# Patient Record
Sex: Female | Born: 1935 | Race: White | Hispanic: No | Smoking: Never smoker
Health system: Southern US, Community
[De-identification: ages and names within clinical notes are randomized; demographics above are authoritative.]

## PROBLEM LIST (undated history)

## (undated) DIAGNOSIS — E785 Hyperlipidemia, unspecified: Secondary | ICD-10-CM

## (undated) DIAGNOSIS — E559 Vitamin D deficiency, unspecified: Secondary | ICD-10-CM

## (undated) DIAGNOSIS — M810 Age-related osteoporosis without current pathological fracture: Secondary | ICD-10-CM

## (undated) DIAGNOSIS — I10 Essential (primary) hypertension: Secondary | ICD-10-CM

## (undated) DIAGNOSIS — M1990 Unspecified osteoarthritis, unspecified site: Secondary | ICD-10-CM

## (undated) HISTORY — DX: Vitamin D deficiency, unspecified: E55.9

## (undated) HISTORY — DX: Essential (primary) hypertension: I10

## (undated) HISTORY — DX: Hyperlipidemia, unspecified: E78.5

## (undated) HISTORY — DX: Unspecified osteoarthritis, unspecified site: M19.90

## (undated) HISTORY — DX: Age-related osteoporosis without current pathological fracture: M81.0

## (undated) HISTORY — PX: CHOLECYSTECTOMY: SHX55

## (undated) HISTORY — PX: EYE SURGERY: SHX253

---

## 2004-12-01 ENCOUNTER — Ambulatory Visit: Payer: Self-pay

## 2005-01-11 ENCOUNTER — Ambulatory Visit: Payer: Self-pay

## 2007-08-28 ENCOUNTER — Ambulatory Visit: Payer: Self-pay | Admitting: Family Medicine

## 2011-12-03 ENCOUNTER — Ambulatory Visit: Payer: Self-pay | Admitting: Family Medicine

## 2012-05-29 ENCOUNTER — Ambulatory Visit: Payer: Self-pay | Admitting: Family Medicine

## 2012-06-13 ENCOUNTER — Ambulatory Visit: Payer: Self-pay | Admitting: Family Medicine

## 2012-07-05 ENCOUNTER — Inpatient Hospital Stay: Payer: Self-pay | Admitting: Internal Medicine

## 2012-07-05 LAB — CBC
HCT: 30.4 % — ABNORMAL LOW (ref 35.0–47.0)
HGB: 10.2 g/dL — ABNORMAL LOW (ref 12.0–16.0)
MCH: 28.5 pg (ref 26.0–34.0)
MCHC: 33.5 g/dL (ref 32.0–36.0)
MCV: 85 fL (ref 80–100)
Platelet: 203 10*3/uL (ref 150–440)
RBC: 3.58 10*6/uL — ABNORMAL LOW (ref 3.80–5.20)
RDW: 15.7 % — ABNORMAL HIGH (ref 11.5–14.5)
WBC: 7.7 10*3/uL (ref 3.6–11.0)

## 2012-07-05 LAB — COMPREHENSIVE METABOLIC PANEL
Albumin: 2.9 g/dL — ABNORMAL LOW (ref 3.4–5.0)
Alkaline Phosphatase: 45 U/L — ABNORMAL LOW (ref 50–136)
Anion Gap: 8 (ref 7–16)
BUN: 28 mg/dL — ABNORMAL HIGH (ref 7–18)
Bilirubin,Total: 0.2 mg/dL (ref 0.2–1.0)
Calcium, Total: 8.7 mg/dL (ref 8.5–10.1)
Chloride: 109 mmol/L — ABNORMAL HIGH (ref 98–107)
Co2: 24 mmol/L (ref 21–32)
Creatinine: 0.72 mg/dL (ref 0.60–1.30)
EGFR (African American): >60
EGFR (Non-African Amer.): >60
Glucose: 127 mg/dL — ABNORMAL HIGH (ref 65–99)
Osmolality: 288 (ref 275–301)
Potassium: 4.4 mmol/L (ref 3.5–5.1)
SGOT(AST): 19 U/L (ref 15–37)
SGPT (ALT): 12 U/L (ref 12–78)
Sodium: 141 mmol/L (ref 136–145)
Total Protein: 6.3 g/dL — ABNORMAL LOW (ref 6.4–8.2)

## 2012-07-05 LAB — PROTIME-INR
INR: 1
Prothrombin Time: 13.3 secs (ref 11.5–14.7)

## 2012-07-06 LAB — CBC WITH DIFFERENTIAL/PLATELET
Basophil #: 0 10*3/uL (ref 0.0–0.1)
Basophil %: 0.7 %
Eosinophil #: 0.1 10*3/uL (ref 0.0–0.7)
Eosinophil %: 1 %
HCT: 23.7 % — ABNORMAL LOW (ref 35.0–47.0)
HGB: 8 g/dL — ABNORMAL LOW (ref 12.0–16.0)
Lymphocyte #: 2.1 10*3/uL (ref 1.0–3.6)
Lymphocyte %: 29.6 %
MCH: 28.5 pg (ref 26.0–34.0)
MCHC: 33.8 g/dL (ref 32.0–36.0)
MCV: 84 fL (ref 80–100)
Monocyte #: 0.7 x10 3/mm (ref 0.2–0.9)
Monocyte %: 9.2 %
Neutrophil #: 4.2 10*3/uL (ref 1.4–6.5)
Neutrophil %: 59.5 %
Platelet: 152 10*3/uL (ref 150–440)
RBC: 2.81 10*6/uL — ABNORMAL LOW (ref 3.80–5.20)
RDW: 15.4 % — ABNORMAL HIGH (ref 11.5–14.5)
WBC: 7.1 10*3/uL (ref 3.6–11.0)

## 2012-07-07 LAB — CBC WITH DIFFERENTIAL/PLATELET
Basophil #: 0.1 10*3/uL (ref 0.0–0.1)
Basophil %: 1.3 %
Eosinophil #: 0.2 10*3/uL (ref 0.0–0.7)
Eosinophil %: 3.4 %
HCT: 22.7 % — ABNORMAL LOW (ref 35.0–47.0)
HGB: 7.6 g/dL — ABNORMAL LOW (ref 12.0–16.0)
Lymphocyte #: 2.2 10*3/uL (ref 1.0–3.6)
Lymphocyte %: 37.8 %
MCH: 28.7 pg (ref 26.0–34.0)
MCHC: 33.7 g/dL (ref 32.0–36.0)
MCV: 85 fL (ref 80–100)
Monocyte #: 0.6 x10 3/mm (ref 0.2–0.9)
Monocyte %: 9.7 %
Neutrophil #: 2.8 10*3/uL (ref 1.4–6.5)
Neutrophil %: 47.8 %
Platelet: 169 10*3/uL (ref 150–440)
RBC: 2.66 10*6/uL — ABNORMAL LOW (ref 3.80–5.20)
RDW: 15.3 % — ABNORMAL HIGH (ref 11.5–14.5)
WBC: 5.8 10*3/uL (ref 3.6–11.0)

## 2012-07-08 LAB — CBC WITH DIFFERENTIAL/PLATELET
Basophil #: 0.1 10*3/uL (ref 0.0–0.1)
Basophil %: 0.9 %
Eosinophil #: 0.3 10*3/uL (ref 0.0–0.7)
Eosinophil %: 5.9 %
HCT: 27.7 % — ABNORMAL LOW (ref 35.0–47.0)
HGB: 9.5 g/dL — ABNORMAL LOW (ref 12.0–16.0)
Lymphocyte #: 2.3 10*3/uL (ref 1.0–3.6)
Lymphocyte %: 41 %
MCH: 29.4 pg (ref 26.0–34.0)
MCHC: 34.3 g/dL (ref 32.0–36.0)
MCV: 86 fL (ref 80–100)
Monocyte #: 0.6 x10 3/mm (ref 0.2–0.9)
Monocyte %: 11.1 %
Neutrophil #: 2.3 10*3/uL (ref 1.4–6.5)
Neutrophil %: 41.1 %
Platelet: 185 10*3/uL (ref 150–440)
RBC: 3.23 10*6/uL — ABNORMAL LOW (ref 3.80–5.20)
RDW: 14.9 % — ABNORMAL HIGH (ref 11.5–14.5)
WBC: 5.6 10*3/uL (ref 3.6–11.0)

## 2012-07-09 LAB — CBC WITH DIFFERENTIAL/PLATELET
Basophil #: 0.1 10*3/uL (ref 0.0–0.1)
Basophil %: 1.1 %
Eosinophil #: 0.3 10*3/uL (ref 0.0–0.7)
Eosinophil %: 3.9 %
HCT: 26.3 % — ABNORMAL LOW (ref 35.0–47.0)
HGB: 8.8 g/dL — ABNORMAL LOW (ref 12.0–16.0)
Lymphocyte #: 1.9 10*3/uL (ref 1.0–3.6)
Lymphocyte %: 28.7 %
MCH: 28.8 pg (ref 26.0–34.0)
MCHC: 33.4 g/dL (ref 32.0–36.0)
MCV: 86 fL (ref 80–100)
Monocyte #: 0.7 x10 3/mm (ref 0.2–0.9)
Monocyte %: 10.2 %
Neutrophil #: 3.8 10*3/uL (ref 1.4–6.5)
Neutrophil %: 56.1 %
Platelet: 197 10*3/uL (ref 150–440)
RBC: 3.05 10*6/uL — ABNORMAL LOW (ref 3.80–5.20)
RDW: 15.1 % — ABNORMAL HIGH (ref 11.5–14.5)
WBC: 6.8 10*3/uL (ref 3.6–11.0)

## 2012-07-09 LAB — HEMOGLOBIN: HGB: 8.2 g/dL — ABNORMAL LOW (ref 12.0–16.0)

## 2012-07-10 LAB — BASIC METABOLIC PANEL
Anion Gap: 8 (ref 7–16)
BUN: 12 mg/dL (ref 7–18)
Calcium, Total: 7.7 mg/dL — ABNORMAL LOW (ref 8.5–10.1)
Chloride: 109 mmol/L — ABNORMAL HIGH (ref 98–107)
Co2: 27 mmol/L (ref 21–32)
Creatinine: 0.64 mg/dL (ref 0.60–1.30)
EGFR (African American): >60
EGFR (Non-African Amer.): >60
Glucose: 161 mg/dL — ABNORMAL HIGH (ref 65–99)
Osmolality: 290 (ref 275–301)
Potassium: 3.2 mmol/L — ABNORMAL LOW (ref 3.5–5.1)
Sodium: 144 mmol/L (ref 136–145)

## 2012-07-10 LAB — CBC WITH DIFFERENTIAL/PLATELET
Basophil #: 0 10*3/uL (ref 0.0–0.1)
Basophil %: 0.2 %
Eosinophil #: 0 10*3/uL (ref 0.0–0.7)
Eosinophil %: 0.1 %
HCT: 24.7 % — ABNORMAL LOW (ref 35.0–47.0)
HGB: 8.3 g/dL — ABNORMAL LOW (ref 12.0–16.0)
Lymphocyte #: 0.9 10*3/uL — ABNORMAL LOW (ref 1.0–3.6)
Lymphocyte %: 7 %
MCH: 28.9 pg (ref 26.0–34.0)
MCHC: 33.6 g/dL (ref 32.0–36.0)
MCV: 86 fL (ref 80–100)
Monocyte #: 0.8 x10 3/mm (ref 0.2–0.9)
Monocyte %: 6.2 %
Neutrophil #: 11.1 10*3/uL — ABNORMAL HIGH (ref 1.4–6.5)
Neutrophil %: 86.5 %
Platelet: 183 10*3/uL (ref 150–440)
RBC: 2.87 10*6/uL — ABNORMAL LOW (ref 3.80–5.20)
RDW: 14.7 % — ABNORMAL HIGH (ref 11.5–14.5)
WBC: 12.9 10*3/uL — ABNORMAL HIGH (ref 3.6–11.0)

## 2012-07-15 ENCOUNTER — Inpatient Hospital Stay: Payer: Self-pay | Admitting: Specialist

## 2012-07-15 LAB — COMPREHENSIVE METABOLIC PANEL
Albumin: 2.4 g/dL — ABNORMAL LOW (ref 3.4–5.0)
Alkaline Phosphatase: 61 U/L (ref 50–136)
Anion Gap: 7 (ref 7–16)
BUN: 11 mg/dL (ref 7–18)
Bilirubin,Total: 0.2 mg/dL (ref 0.2–1.0)
Calcium, Total: 8.1 mg/dL — ABNORMAL LOW (ref 8.5–10.1)
Chloride: 104 mmol/L (ref 98–107)
Co2: 30 mmol/L (ref 21–32)
Creatinine: 0.65 mg/dL (ref 0.60–1.30)
EGFR (African American): >60
EGFR (Non-African Amer.): >60
Glucose: 153 mg/dL — ABNORMAL HIGH (ref 65–99)
Osmolality: 284 (ref 275–301)
Potassium: 3.1 mmol/L — ABNORMAL LOW (ref 3.5–5.1)
SGOT(AST): 15 U/L (ref 15–37)
SGPT (ALT): 10 U/L — ABNORMAL LOW (ref 12–78)
Sodium: 141 mmol/L (ref 136–145)
Total Protein: 6.2 g/dL — ABNORMAL LOW (ref 6.4–8.2)

## 2012-07-15 LAB — CBC
HCT: 20.6 % — ABNORMAL LOW (ref 35.0–47.0)
HGB: 6.9 g/dL — ABNORMAL LOW (ref 12.0–16.0)
MCH: 29.2 pg (ref 26.0–34.0)
MCHC: 33.7 g/dL (ref 32.0–36.0)
MCV: 87 fL (ref 80–100)
Platelet: 317 10*3/uL (ref 150–440)
RBC: 2.37 10*6/uL — ABNORMAL LOW (ref 3.80–5.20)
RDW: 14.5 % (ref 11.5–14.5)
WBC: 7.9 10*3/uL (ref 3.6–11.0)

## 2012-07-16 LAB — CBC WITH DIFFERENTIAL/PLATELET
Basophil #: 0.1 10*3/uL (ref 0.0–0.1)
Basophil %: 1.2 %
Eosinophil #: 0.5 10*3/uL (ref 0.0–0.7)
Eosinophil %: 6.8 %
HCT: 27.1 % — ABNORMAL LOW (ref 35.0–47.0)
HGB: 9.3 g/dL — ABNORMAL LOW (ref 12.0–16.0)
Lymphocyte #: 1.8 10*3/uL (ref 1.0–3.6)
Lymphocyte %: 26.1 %
MCH: 29.7 pg (ref 26.0–34.0)
MCHC: 34.3 g/dL (ref 32.0–36.0)
MCV: 87 fL (ref 80–100)
Monocyte #: 0.9 x10 3/mm (ref 0.2–0.9)
Monocyte %: 13.5 %
Neutrophil #: 3.6 10*3/uL (ref 1.4–6.5)
Neutrophil %: 52.4 %
Platelet: 279 10*3/uL (ref 150–440)
RBC: 3.13 10*6/uL — ABNORMAL LOW (ref 3.80–5.20)
RDW: 14.3 % (ref 11.5–14.5)
WBC: 6.9 10*3/uL (ref 3.6–11.0)

## 2012-07-16 LAB — BASIC METABOLIC PANEL
Anion Gap: 2 — ABNORMAL LOW (ref 7–16)
BUN: 7 mg/dL (ref 7–18)
Calcium, Total: 8 mg/dL — ABNORMAL LOW (ref 8.5–10.1)
Chloride: 109 mmol/L — ABNORMAL HIGH (ref 98–107)
Co2: 32 mmol/L (ref 21–32)
Creatinine: 0.66 mg/dL (ref 0.60–1.30)
EGFR (African American): >60
EGFR (Non-African Amer.): >60
Glucose: 92 mg/dL (ref 65–99)
Osmolality: 283 (ref 275–301)
Potassium: 3.9 mmol/L (ref 3.5–5.1)
Sodium: 143 mmol/L (ref 136–145)

## 2012-07-16 LAB — MAGNESIUM: Magnesium: 2.1 mg/dL

## 2012-07-17 LAB — CBC WITH DIFFERENTIAL/PLATELET
Basophil #: 0.1 10*3/uL (ref 0.0–0.1)
Basophil %: 1.8 %
Eosinophil #: 0.5 10*3/uL (ref 0.0–0.7)
Eosinophil %: 7.1 %
HCT: 31 % — ABNORMAL LOW (ref 35.0–47.0)
HGB: 10.2 g/dL — ABNORMAL LOW (ref 12.0–16.0)
Lymphocyte #: 1.6 10*3/uL (ref 1.0–3.6)
Lymphocyte %: 24.2 %
MCH: 28.8 pg (ref 26.0–34.0)
MCHC: 33.1 g/dL (ref 32.0–36.0)
MCV: 87 fL (ref 80–100)
Monocyte #: 0.8 x10 3/mm (ref 0.2–0.9)
Monocyte %: 12.5 %
Neutrophil #: 3.6 10*3/uL (ref 1.4–6.5)
Neutrophil %: 54.4 %
Platelet: 329 10*3/uL (ref 150–440)
RBC: 3.55 10*6/uL — ABNORMAL LOW (ref 3.80–5.20)
RDW: 14.4 % (ref 11.5–14.5)
WBC: 6.6 10*3/uL (ref 3.6–11.0)

## 2013-12-24 ENCOUNTER — Ambulatory Visit: Payer: Self-pay | Admitting: Family Medicine

## 2015-01-31 DIAGNOSIS — I10 Essential (primary) hypertension: Secondary | ICD-10-CM | POA: Diagnosis not present

## 2015-01-31 DIAGNOSIS — E780 Pure hypercholesterolemia: Secondary | ICD-10-CM | POA: Diagnosis not present

## 2015-01-31 DIAGNOSIS — Z0000 Encounter for general adult medical examination without abnormal findings: Secondary | ICD-10-CM | POA: Diagnosis not present

## 2015-01-31 DIAGNOSIS — M810 Age-related osteoporosis without current pathological fracture: Secondary | ICD-10-CM | POA: Diagnosis not present

## 2015-01-31 DIAGNOSIS — E559 Vitamin D deficiency, unspecified: Secondary | ICD-10-CM | POA: Diagnosis not present

## 2015-03-01 NOTE — Consult Note (Signed)
Chief Complaint:   Subjective/Chief Complaint Patient in vascular lab. Recent events noted. Agree with current management paln. Will follow.   Electronic Signatures: Lurline DelIftikhar, Damario Gillie (MD)  (Signed 28-Aug-13 17:54)  Authored: Chief Complaint   Last Updated: 28-Aug-13 17:54 by Lurline DelIftikhar, Celestial Barnfield (MD)

## 2015-03-01 NOTE — Consult Note (Signed)
PATIENT NAME:  Rhonda Ingram, Rhonda Ingram MR#:  [id number] DATE OF BIRTH:  [birth year]  DATE OF CONSULTATION:  07/16/2012  REFERRING PHYSICIAN:   CONSULTING PHYSICIAN:  Lurline DelShaukat Reign Bartnick, MD  REASON FOR CONSULTATION: Symptomatic anemia.   HISTORY OF PRESENT ILLNESS: This is an 81 year old Caucasian female who recently underwent a colonoscopy a few months ago which showed colonic diverticulosis. The patient was admitted about a week ago to the hospital with significant lower gastrointestinal bleed. She was initially managed conservatively, although she had rebleed after a few days of admission and an angiogram showed active bleeding at hepatic flexure. The patient underwent embolization. She required blood transfusion. Her hemoglobin and hematocrit became more stable after embolization and she was discharged home three days ago with a hemoglobin of 8.3. She was seen by Dr. Dossie Arbourrissman in his office yesterday and hemoglobin was repeated which came back at 7. The patient was symptomatic and was feeling very weak. She denies any bowel movement since discharge. She denies any melena, hematemesis or hematochezia. The patient was sent to the Emergency Room for outpatient blood transfusion, but ended up being admitted to the hospital. Since admission and in fact since her discharge a few days ago, she has not had any bowel movement, rectal bleeding, hematochezia or melena. She feels better after 2 units of packed RBCs transfusion and her hemoglobin is now 9.3.   PAST MEDICAL HISTORY:  1. History of hypertension. 2. Arthritis. 3. Spinal stenosis. 4. Colonic diverticulosis.   PAST SURGICAL HISTORY:  1. Cholecystectomy.  2. Cataract surgery.   HOME MEDICATIONS:  1. Pravastatin. 2. Omeprazole. 3. Benazepril.    REVIEW OF SYSTEMS: Negative except for what is mentioned in the History of Present Illness.   LIMITED: PHYSICAL EXAMINATION:  GENERAL: Fairly well built female who appears somewhat pale, hemodynamically very  stable with a heart rate in 70s.   VITAL SIGNS: Blood pressure 148/72 and she is afebrile.   ABDOMEN: Soft and benign, nontender, nondistended. No rebound or guarding was noted.   The rest of the physical examination appears to be unremarkable as well.   LABORATORY DATA: Hemoglobin currently is 9.3.   ASSESSMENT AND PLAN: The patient is with symptomatic anemia. This is probably a consequence of bleeding in the recent past and there are no signs of significant active gastrointestinal blood loss since her discharge three days ago. The patient has responded well to packed RBC transfusion and, again, as mentioned above, there are no signs of further active gastrointestinal blood loss. I would repeat a hemoglobin and hematocrit tomorrow morning. If hemoglobin and hematocrit remains stable without any signs of active bleeding, she can be discharged home and should have a repeat hemoglobin and hematocrit with Dr. Dossie Arbourrissman the following week. The patient should be discharged home on iron replacement. Plan has been discussed with the patient as well. Will follow.   ____________________________ Lurline DelShaukat Ignazio Kincaid, MD si:ap D: 07/16/2012 20:07:00 ET T: 07/17/2012 09:50:58 ET JOB#: [id number]  cc: Lurline DelShaukat Durinda Buzzelli, MD, <Dictator> Lurline DelSHAUKAT Mckenzey Parcell MD ELECTRONICALLY SIGNED 07/23/2012 11:03

## 2015-03-01 NOTE — Consult Note (Signed)
Chief Complaint:   Subjective/Chief Complaint Feels well. No BM. H and H better. Discussed with Dr. Cherlynn KaiserSainani. Probable DC today and follow up with Dr. Dossie Arbourrissman next week for repeat H and H. Thanks.   Electronic Signatures: Lurline DelIftikhar, Kyrie Bun (MD)  (Signed 05-Sep-13 12:49)  Authored: Chief Complaint   Last Updated: 05-Sep-13 12:49 by Lurline DelIftikhar, Toryn Mcclinton (MD)

## 2015-03-01 NOTE — Consult Note (Signed)
Consult dictated. Pt of Dr. Marlan PalauIftikhar's who presented with gross rectal bleeding Sat night. No abd pain. No bleeding since admission. Felt dizzy yest but this AM, feels fine. Had colonoscopy/EGD in Feb by Dr. Niel HummerIftikhar. Apparently showed diverticulosis and gastritis, which was treated. Also, had video capsule study. I do not the reports to review. Hgb gradaully falling. May need unit of blood transfusion today. On liquid diet. Since no further bleeding, can gradually advance diet. Will let Dr. Niel HummerIftikhar know or pt's presence. He will assume care from GI. THanks.  Electronic Signatures: Lutricia Feilh, Lamyia Cdebaca (MD)  (Signed on 26-Aug-13 07:24)  Authored  Last Updated: 26-Aug-13 07:24 by Lutricia Feilh, Francelia Mclaren (MD)

## 2015-03-01 NOTE — Consult Note (Signed)
Chief Complaint:   Subjective/Chief Complaint No complaints. Minimal blood in stool today. S/P embolization yesterday for recurrent bleeding.   VITAL SIGNS/ANCILLARY NOTES: **Vital Signs.:   29-Aug-13 04:52   Vital Signs Type Routine   Temperature Temperature (F) 99.5   Celsius 37.5   Temperature Source Oral   Pulse Pulse 80   Respirations Respirations 20   Systolic BP Systolic BP 157   Diastolic BP (mmHg) Diastolic BP (mmHg) 73   Mean BP 98   Pulse Ox % Pulse Ox % 95   Pulse Ox Activity Level  At rest   Oxygen Delivery Room Air/ 21 %   Lab Results: Routine Chem:  29-Aug-13 04:30    Glucose, Serum  161   BUN 12   Creatinine (comp) 0.64   Sodium, Serum 144   Potassium, Serum  3.2   Chloride, Serum  109   CO2, Serum 27   Calcium (Total), Serum  7.7   Anion Gap 8   Osmolality (calc) 290   eGFR (African American) >60   eGFR (Non-African American) >60 (eGFR values <18m/min/1.73 m2 may be an indication of chronic kidney disease (CKD). Calculated eGFR is useful in patients with stable renal function. The eGFR calculation will not be reliable in acutely ill patients when serum creatinine is changing rapidly. It is not useful in  patients on dialysis. The eGFR calculation may not be applicable to patients at the low and high extremes of body sizes, pregnant women, and vegetarians.)  Routine Hem:  29-Aug-13 04:30    WBC (CBC)  12.9   RBC (CBC)  2.87   Hemoglobin (CBC)  8.3   Hematocrit (CBC)  24.7   Platelet Count (CBC) 183   MCV 86   MCH 28.9   MCHC 33.6   RDW  14.7   Neutrophil % 86.5   Lymphocyte % 7.0   Monocyte % 6.2   Eosinophil % 0.1   Basophil % 0.2   Neutrophil #  11.1   Lymphocyte #  0.9   Monocyte # 0.8   Eosinophil # 0.0   Basophil # 0.0 (Result(s) reported on 10 Jul 2012 at 05:10AM.)   Assessment/Plan:  Assessment/Plan:   Assessment Lower GI bleed in the area of hepatic flexure, most likely diverticular. No signs of active bleeding since  angiogram and embolization.    Plan Agree with observation for another 24 hours. If no bleeding, she can be discharged with OP follow up with me in 3-6 months. Will sign off. Please call me if needed. Thanks.   Electronic Signatures: IJill Side(MD)  (Signed 29-Aug-13 12:50)  Authored: Chief Complaint, VITAL SIGNS/ANCILLARY NOTES, Lab Results, Assessment/Plan   Last Updated: 29-Aug-13 12:50 by IJill Side(MD)

## 2015-03-01 NOTE — Discharge Summary (Signed)
PATIENT NAME:  Rhonda Ingram, Rhonda Ingram MR#:  [id number] DATE OF BIRTH:  1926-07-05  DATE OF ADMISSION:  07/05/2012 DATE OF DISCHARGE:  07/11/2012  DIAGNOSES:  1. Lower gastrointestinal bleed, diverticular, status post embolization. 2. Anemia of acute blood loss compounded by hemodilution. 3. Hypertension. 4. Arthritis. 5. Chronic back pain. 6. Spinal stenosis. 7. Gastroesophageal reflux disease. 8. Hypokalemia.   DISPOSITION: Patient is being discharged home.   FOLLOW UP: Follow up with Dr. Niel HummerIftikhar and PCP, Dr. Dossie Arbourrissman, in 1 to 2 weeks after discharge.   DISCHARGE MEDICATIONS:  1. Omeprazole 20 mg daily.  2. Benazepril 5 mg daily.  3. Pravachol 40 mg daily.   CONSULTATIONS:  1. GI consultation with Dr. Niel HummerIftikhar. 2. Vascular surgery consultation with Dr. Wyn Quakerew.   LABORATORY, DIAGNOSTIC, AND RADIOLOGICAL DATA: Bleeding scan was positive for bleeding in the right colon near the hepatic flexure Glucose 129, BUN 24, creatinine and electrolytes normal. LFTs normal. Hemoglobin 10.2 on admission, 8.3 by the time of discharge.   HOSPITAL COURSE: Patient is an [age]-day-old female with past medical history of hypertension, chronic back pain, spinal stenosis on NSAIDs, hyperlipidemia with history of diverticulosis, hemorrhoids and gastritis presented with lower GI bleed. Her colonoscopy by Dr. Niel HummerIftikhar in February had shown hemorrhoids and diverticulosis. Initially during the hospitalization patient continued to have very slow bleeding with one bloody bowel movement per day, however on 07/09/2012 she had sudden onset of severe bleeding with passage of clots therefore a STAT bleeding scan was obtained which showed bleeding in the right colon/hepatic flexure region. Vascular surgery was consulted urgently and patient underwent embolization on 07/09/2012 following which her lower GI bleeding resolved completely. She was tolerating a solid diet by the time of discharge. She developed anemia from the acute blood  loss and had to be transfused 2 units of blood. Her hypertension has remained stable. Initially her antihypertensive medications were held due to her ongoing lower GI bleed. At time of discharge low dose benazepril has been started. Patient has been advised to avoid taking NSAIDs. She had mild hypokalemia which was supplemented. She is being discharged home in a stable condition.   TIME SPENT: 45 minutes.   ____________________________ Darrick MeigsSangeeta Tytus Strahle, MD sp:cms D: 07/11/2012 15:24:43 ET T: 07/11/2012 17:43:02 ET JOB#: [id number]  cc: Darrick MeigsSangeeta Symphany Fleissner, MD, <Dictator> Steele SizerMark A. Crissman, MD  Darrick MeigsSANGEETA Reiko Vinje MD ELECTRONICALLY SIGNED 07/19/2012 6:52

## 2015-03-01 NOTE — Consult Note (Signed)
Brief Consult Note: Diagnosis: Symptomatic anemia.   Patient was seen by consultant.   Consult note dictated.   Comments: Patient with recent diverticular bleed and significant anemia requiring angiogram and embolization. Repeat hemoglobin 3 days after discharge is 7 (discharge hemoglobin 8.3). No evidence of recurrent bleeding and the drop appears to be secondary to the prior blood loss with slow hemodilution over period of time. Hemoglobin is better after transfusion.  Recommendations: Repeat CBC in am. If H and H remains stable without any signs of active bleeding, she can be discharged with repeat H and H with Dr. Dossie Arbourrissman next week.  Electronic Signatures: Lurline DelIftikhar, Dashley Monts (MD)  (Signed 04-Sep-13 20:03)  Authored: Brief Consult Note   Last Updated: 04-Sep-13 20:03 by Lurline DelIftikhar, Remedy Corporan (MD)

## 2015-03-01 NOTE — Consult Note (Signed)
PATIENT NAME:  Rhonda Ingram, Rhonda Ingram MR#:  [id number] DATE OF BIRTH:  1926/05/09  DATE OF CONSULTATION:  07/07/2012  REFERRING PHYSICIAN:   CONSULTING PHYSICIAN:  Ezzard StandingPaul Y. Bluford Kaufmannh, MD  REASON FOR REFERRAL: Gastrointestinal bleeding.   HISTORY OF PRESENT ILLNESS: The patient is an 81 year old white female with a known history of hypertension and arthritis who came in Saturday night with darks stools. The patient had approximately four episodes of bleeding. At that time she denied any dizziness or lightheadedness or abdominal pain.   The patient had both upper endoscopy and colonoscopy by Dr. Niel HummerIftikhar in February. Colonoscopy showed evidence of diverticulosis and internal hemorrhoids. She also had a gastroscopy which showed evidence of gastritis. There was treated with medication. She recalled even having a video capsule study. I do not know the results of those studies. At one point she was placed on some Prilosec but it was causing some symptoms; therefore it was stopped. The patient was then admitted for further evaluation and management.   According to the patient, the patient has had no further rectal bleeding. She, again, denies any abdominal pain. There is no nausea or vomiting. There are no chest pains or palpitations. There is no coughing or shortness of breath. There is no fever or chills. The patient denies any weight changes. She did feel dizzy and felt lightheaded yesterday when she got up, but no longer feels that way this morning. She is on a liquid diet.   PAST MEDICAL HISTORY:  1. Chronic back pain.  2. Hyperlipidemia.  3. Hypertension.  4. History of spinal stenosis.   PAST SURGICAL HISTORY:  1. Cataract surgery. 2. Cholecystectomy.   HOME MEDICATIONS: 1. Meloxicam. 2. Carvedilol.  3. Simvastatin. 4. Tylenol. 5. Ibuprofen.  6. Benazepril.   FAMILY HISTORY: Hypertension.   SOCIAL HISTORY: She denies any alcohol and smoking.   ALLERGIES: There are no known drug allergies.    REVIEW OF SYSTEMS: Described above. There is no change from admission review of systems done on the 24th.   PHYSICAL EXAMINATION:  GENERAL: The patient is alert and oriented and in no acute distress.   VITAL SIGNS: She is afebrile this morning. Temperature 98.9, pulse 74, respirations 18, blood pressure 123/71, pulse ox 94% on room air.    HEENT: Normocephalic, atraumatic head. Pupils are equally reactive. Throat was clear.   NECK: Supple.   CARDIAC: Regular rhythm and rate without murmurs.   LUNGS: Clear bilaterally.   ABDOMEN: Normoactive bowel sounds, soft. It was nontender throughout. There is no hepatomegaly. There are no palpable masses. She had active bowel sounds.   EXTREMITIES: No clubbing, cyanosis, or edema.   NEUROLOGIC: Examination is nonfocal.   SKIN: Negative.   LABORATORY, DIAGNOSTIC, AND RADIOLOGICAL DATA: Initial hemoglobin 10.2. It came down to 8.0 yesterday. It is 7.6 this morning. White count is 5.8. Liver enzymes are normal. Sodium 141 on admission, potassium 4.4, chloride 109, CO2 24, BUN 28, creatinine 0.72, glucose 127, INR was normal.   IMPRESSION/RECOMMENDATIONS: This is a patient with known history of diverticulosis and gastritis who presents with rectal bleeding. She does take NSAIDs. The bleeding has stopped although hemoglobin has dropped with hydration. I suspect this is a diverticular bleed, not an upper gastrointestinal bleed. Nevertheless, the patient was placed in Protonix. She may need a blood transfusion today since it is less than 8. We will continue with the IV fluids. I will inform Dr. Niel HummerIftikhar of the patient's admission. He will take over the gastroenterology care from now  on. Thank you for the referral.   ____________________________ Ezzard Standing. Bluford Kaufmann, MD pyo:ap D: 07/07/2012 07:30:36 ET T: 07/07/2012 10:32:31 ET JOB#: [id number]  cc: Ezzard Standing. Bluford Kaufmann, MD, <Dictator> Ezzard Standing Avangelina Flight MD ELECTRONICALLY SIGNED 07/10/2012 12:41

## 2015-03-01 NOTE — Consult Note (Signed)
Chief Complaint:   Subjective/Chief Complaint Feels better. One small dark BM today. Hgb better at 9.5.  Will advance to full liquid diet. May advance to soft diet by tomorrow if no signs of active bleeding. Discussed with Dr. Dava NajjarPanwar.   Electronic Signatures: Lurline DelIftikhar, Yasemin Rabon (MD)  (Signed 27-Aug-13 17:13)  Authored: Chief Complaint   Last Updated: 27-Aug-13 17:13 by Lurline DelIftikhar, Weylyn Ricciuti (MD)

## 2015-03-01 NOTE — Consult Note (Signed)
Patient anemic with acute GI bleeding.  Has pos bleeding scan for right colon/hepatic flexure area.  Discussed embolization and patient desires to proceed.  Electronic Signatures: Annice Needyew, Brylynn Hanssen S (MD)  (Signed on 28-Aug-13 16:39)  Authored  Last Updated: 28-Aug-13 16:39 by Annice Needyew, Richard Holz S (MD)

## 2015-03-01 NOTE — H&P (Signed)
PATIENT NAME:  Rhonda Ingram, Rhonda Ingram MR#:  [id number] DATE OF BIRTH:  Sep 29, 1926  DATE OF ADMISSION:  07/15/2012  CHIEF COMPLAINT: Low hemoglobin.   HISTORY OF PRESENT ILLNESS: The patient is an [age] year old Caucasian female with a history of lower GI bleeding, diverticula, status post embolization, anemia, and hypertension who presented to the ED with a low hemoglobin at 7. The patient was just discharged from the hospital about five days ago and followed up with PCP. She was noted to have a low hemoglobin of 7 and then was sent by PCP for blood transfusion. The patient denies any symptoms. No weakness. No chest pain, palpitations, melena, or bloody stool. No bloody urine. No easy bruising or bleeding. The patient's hemoglobin is 6.9 in the ED today and she is admitted with symptomatic anemia for blood transfusion.   PAST MEDICAL HISTORY:  1. Lower GI bleeding. 2. Diverticula. 3. Anemia. 4. Hypertension. 5. Arthritis. 6. Chronic back pain. 7. Spinal stenosis.  8. Gastroesophageal reflux disease. 9. Hypokalemia.   SOCIAL HISTORY: No alcohol drinking, illicit drugs, or smoking.   PAST SURGICAL HISTORY:  1. Cataract removal. 2. Cholecystectomy.   FAMILY HISTORY: Hypertension.   ALLERGIES: Aspirin.    MEDICATIONS:  1. Pravastatin 40 mg p.o. daily. 2. Omeprazole 20 mg p.o. daily.  3. Benazepril 5 mg p.o. daily.   REVIEW OF SYSTEMS: CONSTITUTIONAL: The patient denies any fever or chills. No headache or dizziness. No weakness. EYES: No double vision, blurred vision. ENT: No epistaxis, postnasal drip, or dysphagia. CARDIOVASCULAR: No chest pain, palpitation, orthopnea, or nocturnal dyspnea. No leg edema. GI: No abdominal pain, nausea, vomiting, or diarrhea. No melena or bloody stool. PULMONARY: No cough, sputum, shortness of breath, or hemoptysis. GU: No dysuria, hematuria, or incontinence. SKIN: No rash or jaundice. MUSCULOSKELETAL: No joint pain or edema. HEMATOLOGY: No easy bruising,  bleeding. NEUROLOGY: No syncope, loss of consciousness, or seizure.    PHYSICAL EXAMINATION:   VITAL SIGNS: Temperature 98.1, blood pressure 139/61, pulse 78, respirations 20, oxygen saturation 96% on room air.   GENERAL: The patient is alert, awake, oriented in no acute distress.   HEENT: Pupils round, equal, reactive to light and accommodation. Moist oral mucosa. Clear oropharynx.   NECK: Supple. No JVD or carotid bruit. No lymphadenopathy. No thyromegaly.    CARDIOVASCULAR: S1, S2 regular rate and rhythm. No murmurs, rubs, or gallops.    PULMONARY: Bilateral air entry. No wheezing or rales. No use of accessory muscles to breathe.   ABDOMEN: Soft. No distention or tenderness. No organomegaly. Bowel sounds present.   SKIN: No rash or jaundice.   EXTREMITIES: No edema, clubbing, or cyanosis. No calf tenderness. Strong bilateral pedal pulses.   NEUROLOGIC: Alert and oriented x3. No focal deficit. Power 5/5. Sensation intact. Deep tendon reflexes 2+.   LABORATORY DATA: WBC 7.9, hemoglobin 6.9, platelets 317, glucose 153, BUN 11, creatinine 0.65, sodium 141, potassium 3.1, chloride 104, bicarb 30.   IMPRESSION:  1. Asymptomatic anemia.  2. Hypokalemia.  3. Hypertension.  4. History of GI bleeding.  5. Diverticula.  6. Gastroesophageal reflux disease.   PLAN OF TREATMENT:  1. The patient will be admitted to medical floor.  2. We will get PRBC transfusion and follow-up CBC. 3. Start Protonix 40 mg IV b.i.d.  4. We will get a GI consult from Dr. Niel HummerIftikhar.  5. For hypokalemia, we will give potassium supplement and follow-up BMP, magnesium level.  6. DVT prophylaxis with TEDs.  7. For hypertension, continue benazepril.   Discussed  the patient's situation and the plan of treatment with the patient.   TIME SPENT: About 50 minutes.   ____________________________ Shaune Pollack, MD qc:drc D: 07/15/2012 18:12:16 ET T: 07/16/2012 07:34:13 ET JOB#: [id number]  cc: Shaune Pollack, MD,  <Dictator> Shaune Pollack MD ELECTRONICALLY SIGNED 07/16/2012 16:02

## 2015-03-01 NOTE — Op Note (Signed)
PATIENT NAME:  Rhonda Ingram, Rhonda Ingram MR#:  [id number] DATE OF BIRTH:  1926-09-14  DATE OF PROCEDURE:  07/09/2012  PREOPERATIVE DIAGNOSIS: Acute GI bleeding with severe anemia and positive bleeding scan.   POSTOPERATIVE DIAGNOSIS: Acute GI bleeding with severe anemia and positive bleeding scan.  PROCEDURES:  1. Catheter placement in superselective branch of superior mesenteric artery into the hepatic flexure and transverse colon.  2. Aortogram and selective SMA arteriogram.  3. Microbead embolization to the hepatic flexure and transverse colon with 500 to 700 micron polyvinyl alcohol beads.  4. StarClose closure device, right femoral artery.   SURGEON: Annice NeedyJason S. Joseeduardo Brix, MD    ANESTHESIA: Local with moderate conscious sedation.   ESTIMATED BLOOD LOSS: Minimal.   FLUOROSCOPY TIME: 6 minutes.   CONTRAST USED: 27 mL Visipaque.   INDICATION FOR PROCEDURE: This is an 81 year old white female with acute lower GI bleeding. She has been transfused blood but remains anemic. She has a positive bleeding scan for the hepatic flexure and transverse colon. She is brought to the angiogram suite for potential embolization. Risks and benefits are discussed. Informed consent is obtained.   DESCRIPTION OF PROCEDURE: The patient is brought to the Vascular Interventional Radiology Suite. Groins were shaved and prepped and a sterile surgical field was created. The right femoral head was localized with fluoroscopy. Ultrasound was used to access the right femoral artery which was done without difficulty under direct ultrasound guidance with a Seldinger needle and permanent image was recorded. J-wire and 5 French sheath were placed. Pigtail catheter was placed in the aorta at the T12 level and aortogram was performed. This showed normal origins of the celiac, SMA, and renal arteries as best could be told from an AP direction. I then used a VS-1 catheter to selectively cannulate the superior mesenteric artery. Imaging performed  through this showed typical configuration of the superior mesenteric artery and we went out a branch that was seen feeding directly into the hepatic flexure. This was done with a prograde microcatheter. There was collateralization into the proximal transverse colon and it was in this location there appeared to be a significant blush at the hepatic flexure when a superselective injection was performed. With this finding we deployed 500 to 700 micron polyvinyl alcohol beads into the location until the blush was diminished but there was still blood flow within the main vessels. Once we had done this, I elected to terminate the procedure. The diagnostic catheter and microcatheter were removed. Oblique arteriogram was performed of the right femoral artery and StarClose closure device was deployed in the usual fashion with excellent hemostatic result. The patient tolerated the procedure well and was taken to the recovery room in stable condition.    ____________________________ Annice NeedyJason S. Zoran Yankee, MD jsd:drc D: 07/09/2012 17:12:15 ET T: 07/09/2012 17:51:33 ET JOB#: [id number]  cc: Annice NeedyJason S. Dajai Wahlert, MD, <Dictator> Annice NeedyJASON S Zayyan Mullen MD ELECTRONICALLY SIGNED 07/16/2012 10:02

## 2015-03-01 NOTE — Consult Note (Signed)
Chief Complaint:   Subjective/Chief Complaint Had onle bloody bowel movement today. Overall better.   VITAL SIGNS/ANCILLARY NOTES: **Vital Signs.:   26-Aug-13 16:05   Vital Signs Type 1 hr Post Blood   Temperature Temperature (F) 98.4   Celsius 36.8   Temperature Source oral   Pulse Pulse 75   Respirations Respirations 18   Systolic BP Systolic BP 125   Diastolic BP (mmHg) Diastolic BP (mmHg) 75   Mean BP 91   Pulse Ox % Pulse Ox % 95   Oxygen Delivery Room Air/ 21 %   Lab Results: Routine Hem:  26-Aug-13 05:11    WBC (CBC) 5.8   RBC (CBC)  2.66   Hemoglobin (CBC)  7.6   Hematocrit (CBC)  22.7   Platelet Count (CBC) 169   MCV 85   MCH 28.7   MCHC 33.7   RDW  15.3   Neutrophil % 47.8   Lymphocyte % 37.8   Monocyte % 9.7   Eosinophil % 3.4   Basophil % 1.3   Neutrophil # 2.8   Lymphocyte # 2.2   Monocyte # 0.6   Eosinophil # 0.2   Basophil # 0.1 (Result(s) reported on 07 Jul 2012 at 05:46AM.)   Assessment/Plan:  Assessment/Plan:   Assessment Probable diverticular bleed. Clinically better. Blood loss anemia.    Plan Agree with current conservative management. Clear liquid diet. Will follow.   Electronic Signatures: Lurline DelIftikhar, Amarachukwu Lakatos (MD)  (Signed 26-Aug-13 17:44)  Authored: Chief Complaint, VITAL SIGNS/ANCILLARY NOTES, Lab Results, Assessment/Plan   Last Updated: 26-Aug-13 17:44 by Lurline DelIftikhar, Kazimir Hartnett (MD)

## 2015-03-01 NOTE — Discharge Summary (Signed)
PATIENT NAME:  Rhonda Ingram, [name] MR#:  [id number] DATE OF BIRTH:  1926-08-03  DATE OF ADMISSION:  07/15/2012 DATE OF DISCHARGE:  07/17/2012  HISTORY: For a detailed note, please take a look at the history and physical done by Dr. Imogene Burnhen on admission.   DIAGNOSES AT DISCHARGE:  1. Symptomatic anemia.  2. History of gastrointestinal bleed, diverticular, status post embolization.  3. Hypertension.  4. Hyperlipidemia.  5. Hypokalemia.   DIET: The patient is being discharged on a low-sodium, low-fat diet.   ACTIVITY: As tolerated.   FOLLOWUP: Follow up with Dr. Vonita MossMark Crissman in the next 1 to 2 weeks.   DISCHARGE MEDICATIONS: 1. Pravachol 40 mg daily.  2. Benazepril 5 mg daily.  3. Omeprazole 20 mg daily.  4. Iron sulfate 325 mg b.i.d.   CONSULTANT: Dr. Niel HummerIftikhar from gastroenterology.   PERTINENT STUDIES DURING THE HOSPITAL COURSE: Admission hemoglobin of 6.9, discharge hemoglobin of 10.2.   HOSPITAL COURSE: This is an 81 year old female who presented to the hospital on 09/03 due to weakness, lethargy and noted to have a hemoglobin of 6.9.  1. Symptomatic anemia. The patient was just recently discharged from the hospital on 08/30 after an acute lower gastrointestinal bleed secondary to diverticulosis, underwent urgent embolization done by Dr. Wyn Quakerew. She was therefore symptomatic after being discharged with her profound anemia, with a hemoglobin of 6.9, therefore was admitted, transfused two units of packed red blood cells. Hemoglobin since then has improved and remained stable. She has had no evidence of any acute bleeding with no melanotic stools. No hematochezia. She was seen in consultation by Dr. Niel HummerIftikhar from gastroenterology who did not want to pursue any aggressive intervention at this point. He, therefore, recommended close follow-up as an outpatient. The patient's diet was advanced. She was able to eat regular food with no problems and therefore discharged home. She likely should have a  repeat CBC done next week at Dr. Christell Faithrissman's office. She was discharged on some iron supplements as was recommended by gastroenterology.  2. Hypertension. The patient remained hemodynamically stable and will resume her benazepril upon discharge.  3. Hyperlipidemia. The patient was maintained on Pravachol. She will resume that.  4. Hypokalemia. The patient's potassium was supplemented and it had improved and resolved prior to discharge.   CODE STATUS: The patient is a FULL CODE.   TIME SPENT: 40 minutes.    ____________________________ Rolly PancakeVivek J. Cherlynn KaiserSainani, MD vjs:ap D: 07/17/2012 16:55:30 ET T: 07/18/2012 14:07:10 ET JOB#: [id number]  cc: Rolly PancakeVivek J. Cherlynn KaiserSainani, MD, <Dictator> Steele SizerMark A. Crissman, MD Houston SirenVIVEK J Aysiah Jurado MD ELECTRONICALLY SIGNED 07/21/2012 13:24

## 2015-03-01 NOTE — H&P (Signed)
PATIENT NAME:  Rhonda Ingram, Rhonda Ingram MR#:  [id number] DATE OF BIRTH:  07/21/1926  DATE OF ADMISSION:  07/05/2012  PRIMARY CARE PHYSICIAN: Dr. Dossie Arbourrissman  CHIEF COMPLAINT: "I started pouring blood."  HISTORY OF PRESENT ILLNESS: [name] is an 81 year old Caucasian female with history of hypertension and arthritis comes to the Emergency Room accompanied by family members after she started having dark-colored stools followed by bright red blood per rectum. She had about four episodes and she decided to come to the Emergency Room. She denies any dizziness, lightheadedness, any abdominal pain, fever or vomiting. Patient has history of diverticulosis and internal hemorrhoids per colonoscopies in the past. Her most recent colonoscopy was done in the past few weeks which per patient's verbal report likely showed diverticulosis. She also had an EGD that showed gastritis. This was done by Dr. Niel HummerIftikhar. Patient was placed on Prilosec, however, she started having "gassy symptoms" and hence she stopped taking the Prilosec. Patient is currently hemodynamically stable. She is being admitted for further evaluation and management.   PAST MEDICAL HISTORY:  1. Chronic back pain.  2. Hyperlipidemia.  3. Hypertension.  4. History of spinal stenosis.  5. Diverticulosis per colonoscopy.   PAST SURGICAL HISTORY:  1. Cataract removal.  2. Cholecystectomy.   MEDICATIONS:  1. Meloxicam 15 mg daily.  2. Benazepril 20 mg daily.  3. Carvedilol 6.25 p.o. daily.  4. Simvastatin dose unknown.  5. Tylenol 500 daily p.r.n. 6. Ibuprofen 800 mg daily p.r.n. Patient took last dose of ibuprofen yesterday.   FAMILY HISTORY: Positive for hypertension.   SOCIAL HISTORY: She lives by herself, nonsmoker, nonalcoholic.   ALLERGIES: No known drug allergies.   REVIEW OF SYSTEMS: CONSTITUTIONAL: Positive for fatigue, weakness. No fever. EYES: No blurred or double vision. No glaucoma. ENT: No tinnitus, ear pain, hearing loss.  RESPIRATORY: No cough, wheeze, hemoptysis or chronic obstructive pulmonary disease. CARDIOVASCULAR: No chest pain, orthopnea, edema. GASTROINTESTINAL: Positive for rectal bleed. No abdominal pain, nausea, vomiting. Positive for gastroesophageal reflux disease.  GENITOURINARY: No dysuria, hematuria. ENDOCRINE: No polyuria or nocturia. HEMATOLOGY: Positive for chronic anemia. SKIN: No acne, rash. MUSCULOSKELETAL: Positive for arthritis and low back pain. NEUROLOGIC: No CVA, TIA. PSYCH: No anxiety, depression. All other systems reviewed and negative.    PHYSICAL EXAMINATION:  GENERAL: Patient is awake, alert, oriented x3, not in acute distress.   VITAL SIGNS: She is afebrile, pulse 77, blood pressure 108/53, sats 97% on room air.   HEENT: Atraumatic, normocephalic. Pupils are equal, round, and reactive to light and accommodation. Extraocular movements intact. Oral mucosa is moist.   NECK: Supple. No JVD. No carotid bruit.   RESPIRATORY: Clear to auscultation bilaterally. No rales, rhonchi, respiratory distress, or labored breathing.   CARDIOVASCULAR: Both the heart sounds are normal. Rate, rhythm is regular. PMI not lateralized. Chest nontender.   EXTREMITIES: Good pedal pulses, good femoral pulses. No lower extremity edema.   ABDOMEN: Soft, benign, nontender. No organomegaly. Positive bowel sounds.   NEUROLOGIC: Grossly intact cranial nerves II through XII. No motor or sensory deficits.   SKIN: Warm and dry.   PSYCHIATRIC: Patient is awake, alert, oriented x3.   LABORATORY, DIAGNOSTIC AND RADIOLOGICAL DATA: White count 7.7, hemoglobin and hematocrit 10.2 and 30.4, platelet count 203, glucose 127, BUN 28, creatinine 0.72, sodium 141, potassium 4.4, chloride 109, bicarbonate 24, alkaline phosphatase 45, total protein 6.3, albumin 2.9. PT-INR 13.3 and 1.   ASSESSMENT: 81 year old [name] with:  1. Lower GI bleed, appears diverticular in nature. Patient has history  of diverticulosis and  internal hemorrhoids per colonoscopies in the past. She has no abdominal pain. No fever. She appeared constipated yesterday. Patient will be admitted on the medical floor. Will keep her on clear liquid diet. Have GI see patient. Will monitor hemoglobin and hematocrit, transfuse as needed. Will continue IV fluids for hydration.  2. Chronic anemia. Monitor hemoglobin and hematocrit in the setting of acute GI bleed. Will transfuse as needed. Will type and cross patient.  3. Hypertension. Patient has relative low blood pressure at this time. Continue IV fluids and resume home medications when blood pressure is stable.  4. Arthritis. Continue p.r.n. Tylenol.  5. Gastroesophageal reflux disease/gastritis noted on EGD. Will continue p.o. PPI.  6. Further work-up according to patient's clinical course. Hospital admission plan was discussed with patient and patient's family members.   TIME SPENT: 50 minutes.   ____________________________ Wylie Hail Allena Katz, MD sap:cms D: 07/05/2012 22:35:13 ET T: 07/06/2012 09:02:31 ET JOB#: [id number]  cc: Kofi Murrell A. Allena Katz, MD, <Dictator> Steele Sizer, MD Willow Ora MD ELECTRONICALLY SIGNED 07/06/2012 16:29

## 2015-05-02 ENCOUNTER — Telehealth: Payer: Self-pay | Admitting: Family Medicine

## 2015-05-02 MED ORDER — BENAZEPRIL HCL 5 MG PO TABS: 5.0000 mg | ORAL_TABLET | Freq: Every day | ORAL | Status: DC

## 2015-05-02 NOTE — Telephone Encounter (Signed)
Pt called stated she needs a refill on her BP medication tablets. Pharm is [facility]. Thanks.

## 2015-05-02 NOTE — Telephone Encounter (Signed)
Benazepril HCL 5mg  1 Tab QD

## 2015-05-06 DIAGNOSIS — H26491 Other secondary cataract, right eye: Secondary | ICD-10-CM | POA: Diagnosis not present

## 2015-05-26 ENCOUNTER — Ambulatory Visit (INDEPENDENT_AMBULATORY_CARE_PROVIDER_SITE_OTHER): Payer: Commercial Managed Care - HMO | Admitting: Family Medicine

## 2015-05-26 ENCOUNTER — Encounter: Payer: Self-pay | Admitting: Family Medicine

## 2015-05-26 VITALS — BP 128/76 | HR 79 | Temp 98.5°F | Ht <= 58 in | Wt 123.3 lb

## 2015-05-26 DIAGNOSIS — B353 Tinea pedis: Secondary | ICD-10-CM | POA: Diagnosis not present

## 2015-05-26 MED ORDER — CLOTRIMAZOLE 1 % EX CREA: 1.0000 "application " | TOPICAL_CREAM | Freq: Two times a day (BID) | CUTANEOUS | Status: DC

## 2015-05-26 NOTE — Patient Instructions (Signed)

## 2015-05-26 NOTE — Assessment & Plan Note (Signed)
Appears to be tinea pedis. Will treat with lotramin. Call if not getting better or getting worse. Continue to monitor.

## 2015-05-26 NOTE — Progress Notes (Signed)
BP 128/76 mmHg  Pulse 79  Temp(Src) 98.5 F (36.9 C)  Ht 4' 9.2" (1.453 m)  Wt 123 lb 4.8 oz (55.929 kg)  BMI 26.49 kg/m2  LMP  (LMP Unknown)   Subjective:    Patient ID: Rhonda Ingram, female    DOB: 15-Sep-1926, 81 y.o.   MRN: [id number]  HPI: Rhonda Ingram is a 81 y.o. female  Chief Complaint  Patient presents with  . Rash    bilateral legs and left foot is worse   RASH Duration: 4-5 days  Location: feet and legs  Itching: yes Burning: yes Redness: yes Oozing: yes Scaling: no Blisters: yes Painful: no Fevers: no Change in detergents/soaps/personal care products: no Recent illness: no Recent travel:no History of same: no Context: stable Alleviating factors: nothing Treatments attempted:nothing Shortness of breath: no  Throat/tongue swelling: no Myalgias/arthralgias: no   Relevant past medical, surgical, family and social history reviewed and updated as indicated. Interim medical history since our last visit reviewed. Allergies and medications reviewed and updated.  Review of Systems  Constitutional: Negative.   Respiratory: Negative.   Cardiovascular: Negative.   Skin: Positive for rash. Negative for color change, pallor and wound.  Psychiatric/Behavioral: Negative.     Per HPI unless specifically indicated above     Objective:    BP 128/76 mmHg  Pulse 79  Temp(Src) 98.5 F (36.9 C)  Ht 4' 9.2" (1.453 m)  Wt 123 lb 4.8 oz (55.929 kg)  BMI 26.49 kg/m2  LMP  (LMP Unknown)  Wt Readings from Last 3 Encounters:  05/26/15 123 lb 4.8 oz (55.929 kg)  02/21/15 127 lb (57.607 kg)    Physical Exam  Constitutional: She is oriented to person, place, and time. She appears well-developed and well-nourished. No distress.  HENT:  Head: Normocephalic and atraumatic.  Right Ear: Hearing normal.  Left Ear: Hearing normal.  Nose: Nose normal.  Eyes: Conjunctivae and lids are normal. Right eye exhibits no discharge. Left eye exhibits no discharge. No  scleral icterus.  Pulmonary/Chest: Effort normal. No respiratory distress.  Musculoskeletal: Normal range of motion.  Neurological: She is alert and oriented to person, place, and time.  Skin: Skin is intact. No rash noted.  Excoriated pustules on legs and feet. Swollen fluid filled blisters with erythema between the toes.   Psychiatric: She has a normal mood and affect. Her speech is normal and behavior is normal. Judgment and thought content normal. Cognition and memory are normal.    Results for orders placed or performed in visit on 07/15/12  CBC  Result Value Ref Range   WBC 7.9 3.6-11.0 x10 3/mm 3   RBC 2.37 (L) 3.80-5.20 X10 6/mm 3   HGB 6.9 (L) 12.0-16.0 g/dL   HCT 20.6 (L) 35.0-47.0 %   MCV 87 80-100 fL   MCH 29.2 26.0-34.0 pg   MCHC 33.7 32.0-36.0 g/dL   RDW 14.5 11.5-14.5 %   Platelet 317 150-440 x10 3/mm 3  Comprehensive metabolic panel  Result Value Ref Range   Glucose 153 (H) 65-99 mg/dL   BUN 11 7-18 mg/dL   Creatinine 0.65 0.60-1.30 mg/dL   Sodium 141 136-145 mmol/L   Potassium 3.1 (L) 3.5-5.1 mmol/L   Chloride 104 98-107 mmol/L   Co2 30 21-32 mmol/L   Calcium, Total 8.1 (L) 8.5-10.1 mg/dL   SGOT(AST) 15 15-37 Unit/L   SGPT (ALT) 10 (L) 12-78 U/L   Alkaline Phosphatase 61 50-136 Unit/L   Albumin 2.4 (L) 3.4-5.0 g/dL   Total Protein  6.2 (L) 6.4-8.2 g/dL   Bilirubin,Total 0.2 0.2-1.0 mg/dL   Osmolality 284 275-301   Anion Gap 7 7-16   EGFR (African American) >60    EGFR (Non-African Amer.) >60   CBC with Differential/Platelet  Result Value Ref Range   WBC 6.9 3.6-11.0 x10 3/mm 3   RBC 3.13 (L) 3.80-5.20 X10 6/mm 3   HGB 9.3 (L) 12.0-16.0 g/dL   HCT 27.1 (L) 35.0-47.0 %   MCV 87 80-100 fL   MCH 29.7 26.0-34.0 pg   MCHC 34.3 32.0-36.0 g/dL   RDW 14.3 11.5-14.5 %   Platelet 279 150-440 x10 3/mm 3   Neutrophil % 52.4 %   Lymphocyte % 26.1 %   Monocyte % 13.5 %   Eosinophil % 6.8 %   Basophil % 1.2 %   Neutrophil # 3.6 1.4-6.5 x10 3/mm 3   Lymphocyte  # 1.8 1.0-3.6 x10 3/mm 3   Monocyte # 0.9 0.2-0.9 x10 3/mm    Eosinophil # 0.5 0.0-0.7 x10 3/mm 3   Basophil # 0.1 0.0-0.1 x10 3/mm 3  Basic metabolic panel  Result Value Ref Range   Glucose 92 65-99 mg/dL   BUN 7 7-18 mg/dL   Creatinine 0.66 0.60-1.30 mg/dL   Sodium 143 136-145 mmol/L   Potassium 3.9 3.5-5.1 mmol/L   Chloride 109 (H) 98-107 mmol/L   Co2 32 21-32 mmol/L   Calcium, Total 8.0 (L) 8.5-10.1 mg/dL   Osmolality 283 275-301   Anion Gap 2 (L) 7-16   EGFR (African American) >60    EGFR (Non-African Amer.) >60   Magnesium  Result Value Ref Range   Magnesium 2.1 mg/dL  CBC with Differential/Platelet  Result Value Ref Range   WBC 6.6 3.6-11.0 x10 3/mm 3   RBC 3.55 (L) 3.80-5.20 X10 6/mm 3   HGB 10.2 (L) 12.0-16.0 g/dL   HCT 31.0 (L) 35.0-47.0 %   MCV 87 80-100 fL   MCH 28.8 26.0-34.0 pg   MCHC 33.1 32.0-36.0 g/dL   RDW 14.4 11.5-14.5 %   Platelet 329 150-440 x10 3/mm 3   Neutrophil % 54.4 %   Lymphocyte % 24.2 %   Monocyte % 12.5 %   Eosinophil % 7.1 %   Basophil % 1.8 %   Neutrophil # 3.6 1.4-6.5 x10 3/mm 3   Lymphocyte # 1.6 1.0-3.6 x10 3/mm 3   Monocyte # 0.8 0.2-0.9 x10 3/mm    Eosinophil # 0.5 0.0-0.7 x10 3/mm 3   Basophil # 0.1 0.0-0.1 x10 3/mm 3      Assessment & Plan:   Problem List Items Addressed This Visit      Musculoskeletal and Integument   Tinea pedis - Primary    Appears to be tinea pedis. Will treat with lotramin. Call if not getting better or getting worse. Continue to monitor.       Relevant Medications   clotrimazole (LOTRIMIN) 1 % cream       Follow up plan: Return for As scheduled.

## 2015-06-06 DIAGNOSIS — H26491 Other secondary cataract, right eye: Secondary | ICD-10-CM | POA: Diagnosis not present

## 2015-06-20 ENCOUNTER — Telehealth: Payer: Self-pay | Admitting: Family Medicine

## 2015-06-20 MED ORDER — PRAVASTATIN SODIUM 40 MG PO TABS: 40.0000 mg | ORAL_TABLET | Freq: Every day | ORAL | Status: DC

## 2015-06-20 NOTE — Telephone Encounter (Signed)
Pt called requests her RX for Provastin be faxed to [facility] mail order. Thanks.

## 2015-07-20 ENCOUNTER — Emergency Department
Admission: EM | Admit: 2015-07-20 | Discharge: 2015-07-20 | Disposition: A | Discharge status: Home / Self Care | Payer: Commercial Managed Care - HMO | Attending: Emergency Medicine | Admitting: Emergency Medicine

## 2015-07-20 ENCOUNTER — Telehealth: Payer: Self-pay | Admitting: Unknown Physician Specialty

## 2015-07-20 ENCOUNTER — Encounter: Payer: Self-pay | Admitting: Medical Oncology

## 2015-07-20 ENCOUNTER — Emergency Department: Payer: Commercial Managed Care - HMO

## 2015-07-20 DIAGNOSIS — T7840XA Allergy, unspecified, initial encounter: Secondary | ICD-10-CM | POA: Diagnosis present

## 2015-07-20 DIAGNOSIS — R0602 Shortness of breath: Secondary | ICD-10-CM | POA: Insufficient documentation

## 2015-07-20 DIAGNOSIS — L500 Allergic urticaria: Secondary | ICD-10-CM | POA: Diagnosis not present

## 2015-07-20 DIAGNOSIS — T39095A Adverse effect of salicylates, initial encounter: Secondary | ICD-10-CM | POA: Diagnosis not present

## 2015-07-20 DIAGNOSIS — R05 Cough: Secondary | ICD-10-CM | POA: Diagnosis not present

## 2015-07-20 DIAGNOSIS — I10 Essential (primary) hypertension: Secondary | ICD-10-CM | POA: Insufficient documentation

## 2015-07-20 DIAGNOSIS — L509 Urticaria, unspecified: Secondary | ICD-10-CM

## 2015-07-20 LAB — BASIC METABOLIC PANEL
Anion gap: 9 (ref 5–15)
BUN: 23 mg/dL — ABNORMAL HIGH (ref 6–20)
CO2: 23 mmol/L (ref 22–32)
Calcium: 9 mg/dL (ref 8.9–10.3)
Chloride: 106 mmol/L (ref 101–111)
Creatinine, Ser: 0.89 mg/dL (ref 0.44–1.00)
GFR calc Af Amer: >60 mL/min (ref >60)
GFR calc non Af Amer: 57 mL/min — ABNORMAL LOW (ref >60)
Glucose, Bld: 120 mg/dL — ABNORMAL HIGH (ref 65–99)
Potassium: 3.9 mmol/L (ref 3.5–5.1)
Sodium: 138 mmol/L (ref 135–145)

## 2015-07-20 LAB — TROPONIN I: Troponin I: <0.03 ng/mL (ref <0.031)

## 2015-07-20 LAB — CBC WITH DIFFERENTIAL/PLATELET
Basophils Absolute: 0 10*3/uL (ref 0–0.1)
Basophils Relative: 1 %
Eosinophils Absolute: 0.2 10*3/uL (ref 0–0.7)
Eosinophils Relative: 3 %
HCT: 40.1 % (ref 35.0–47.0)
Hemoglobin: 13.3 g/dL (ref 12.0–16.0)
Lymphocytes Relative: 29 %
Lymphs Abs: 1.8 10*3/uL (ref 1.0–3.6)
MCH: 28.7 pg (ref 26.0–34.0)
MCHC: 33.1 g/dL (ref 32.0–36.0)
MCV: 86.6 fL (ref 80.0–100.0)
Monocytes Absolute: 0.7 10*3/uL (ref 0.2–0.9)
Monocytes Relative: 11 %
Neutro Abs: 3.4 10*3/uL (ref 1.4–6.5)
Neutrophils Relative %: 56 %
Platelets: 199 10*3/uL (ref 150–440)
RBC: 4.63 MIL/uL (ref 3.80–5.20)
RDW: 13.8 % (ref 11.5–14.5)
WBC: 6.1 10*3/uL (ref 3.6–11.0)

## 2015-07-20 MED ORDER — PREDNISONE 50 MG PO TABS: ORAL_TABLET | ORAL | Status: DC

## 2015-07-20 MED ORDER — DIPHENHYDRAMINE HCL 25 MG PO CAPS: 25.0000 mg | ORAL_CAPSULE | ORAL | Status: DC | PRN

## 2015-07-20 MED ORDER — DIPHENHYDRAMINE HCL 50 MG/ML IJ SOLN
25.0000 mg | Freq: Once | INTRAMUSCULAR | Status: AC
  Administered 2015-07-20: 25 mg via INTRAVENOUS

## 2015-07-20 MED ORDER — METHYLPREDNISOLONE SODIUM SUCC 125 MG IJ SOLR
INTRAMUSCULAR | Status: AC
  Filled 2015-07-20: qty 2

## 2015-07-20 MED ORDER — FAMOTIDINE IN NACL 20-0.9 MG/50ML-% IV SOLN
INTRAVENOUS | Status: AC
  Filled 2015-07-20: qty 50

## 2015-07-20 MED ORDER — FAMOTIDINE IN NACL 20-0.9 MG/50ML-% IV SOLN
20.0000 mg | Freq: Once | INTRAVENOUS | Status: AC
  Administered 2015-07-20: 20 mg via INTRAVENOUS

## 2015-07-20 MED ORDER — DIPHENHYDRAMINE HCL 50 MG/ML IJ SOLN
INTRAMUSCULAR | Status: AC
  Filled 2015-07-20: qty 1

## 2015-07-20 MED ORDER — METHYLPREDNISOLONE SODIUM SUCC 125 MG IJ SOLR
125.0000 mg | Freq: Once | INTRAMUSCULAR | Status: AC
  Administered 2015-07-20: 125 mg via INTRAVENOUS

## 2015-07-20 NOTE — Telephone Encounter (Signed)
Pt called stated she is having an allergy attack, she is itching like crazy, hard to breathe, pt stated this has been happening for the last 15 minutes. Please call pt ASAP. Thanks.

## 2015-07-20 NOTE — ED Notes (Signed)
Pt reports she began having sob and itchy rash after taking stanback which is like a BC powder. Pt reports she has taken this in the past but it never did her like this before. Pt took  benadryl PTA. Denies pain.

## 2015-07-20 NOTE — Telephone Encounter (Signed)
Called patient and she stated that her son was on his way to take her to the hospital.

## 2015-07-20 NOTE — ED Provider Notes (Signed)
[facility] Emergency Department Provider Note     Time seen: ----------------------------------------- 4:50 PM on 07/20/2015 -----------------------------------------    I have reviewed the triage vital signs and the nursing notes.   HISTORY  Chief Complaint Allergic Reaction    HPI Rhonda Ingram is a 81 y.o. female who presents to ER for some shortness of breath and itchy rash that started after taking something that was like BC powder. Patient reports she's taken the past but never had a reaction like this. Patient reports itching all over, hives particularly on her arms and abdomen.   Past Medical History  Diagnosis Date  . Vitamin D deficiency   . Hyperlipidemia   . Osteoporosis   . Hypertension   . Arthritis     osteoarthritis    Patient Active Problem List   Diagnosis Date Noted  . Tinea pedis 05/26/2015    Past Surgical History  Procedure Laterality Date  . Cholecystectomy    . Eye surgery      cataract extraction    Allergies Review of patient's allergies indicates no known allergies.  Social History Social History  Substance Use Topics  . Smoking status: Never Smoker   . Smokeless tobacco: Never Used  . Alcohol Use: No    Review of Systems Constitutional: Negative for fever. Eyes: Negative for visual changes. ENT: Negative for sore throat. Cardiovascular: Negative for chest pain. Respiratory: Positive for shortness of breath Gastrointestinal: Negative for abdominal pain, vomiting and diarrhea. Genitourinary: Negative for dysuria. Musculoskeletal: Negative for back pain. Skin: Positive for hives Neurological: Negative for headaches, focal weakness or numbness.  10-point ROS otherwise negative.  ____________________________________________   PHYSICAL EXAM:  VITAL SIGNS: ED Triage Vitals  Enc Vitals Group     BP 07/20/15 1640 174/83 mmHg     Pulse Rate 07/20/15 1640 93     Resp 07/20/15 1640 21      Temp 07/20/15 1640 97.6 F (36.4 C)     Temp Source 07/20/15 1640 Oral     SpO2 07/20/15 1640 95 %     Weight 07/20/15 1640 125 lb (56.7 kg)     Height 07/20/15 1640 5' (1.524 m)     Head Cir --      Peak Flow --      Pain Score --      Pain Loc --      Pain Edu? --      Excl. in GC? --     Constitutional: Alert and oriented. Well appearing and in no distress. Eyes: Conjunctivae are normal. PERRL. Normal extraocular movements. ENT   Head: Normocephalic and atraumatic.   Nose: No congestion/rhinnorhea.   Mouth/Throat: Mucous membranes are moist.   Neck: No stridor. Cardiovascular: Normal rate, regular rhythm. Normal and symmetric distal pulses are present in all extremities. No murmurs, rubs, or gallops. Respiratory: Normal respiratory effort without tachypnea nor retractions. Breath sounds are clear and equal bilaterally. No wheezes/rales/rhonchi. Gastrointestinal: Soft and nontender. No distention. No abdominal bruits.  Musculoskeletal: Nontender with normal range of motion in all extremities. No joint effusions.  No lower extremity tenderness nor edema. Neurologic:  Normal speech and language. No gross focal neurologic deficits are appreciated. Speech is normal. No gait instability. Skin:  Skin is warm, dry and intact. There is urticaria noted on the upper extremities, generalized erythema. Psychiatric: Mood and affect are normal. Speech and behavior are normal. Patient exhibits appropriate insight and judgment.  ____________________________________________  ED COURSE:  Pertinent labs & imaging  results that were available during my care of the patient were reviewed by me and considered in my medical decision making (see chart for details). Patient with an apparent mild allergic reaction. She'll receive IV Solu-Medrol, Benadryl and Pepcid. ____________________________________________   RADIOLOGY  Chest x-ray IMPRESSION: No acute cardiopulmonary  process. ____________________________________________  FINAL ASSESSMENT AND PLAN  Urticaria  Plan: Patient with labs and imaging as dictated above. No clear etiology for urticaria. Labs are otherwise unremarkable. She stable for outpatient follow-up with her doctor, will continue steroids and Benadryl for several days.   Emily Filbert, MD   Emily Filbert, MD 07/20/15 [phone]

## 2015-07-20 NOTE — Discharge Instructions (Signed)
Hives Hives are itchy, red, swollen areas of the skin. They can vary in size and location on your body. Hives can come and go for hours or several days (acute hives) or for several weeks (chronic hives). Hives do not spread from person to person (noncontagious). They may get worse with scratching, exercise, and emotional stress. CAUSES   Allergic reaction to food, additives, or drugs.  Infections, including the common cold.  Illness, such as vasculitis, lupus, or thyroid disease.  Exposure to sunlight, heat, or cold.  Exercise.  Stress.  Contact with chemicals. SYMPTOMS   Red or white swollen patches on the skin. The patches may change size, shape, and location quickly and repeatedly.  Itching.  Swelling of the hands, feet, and face. This may occur if hives develop deeper in the skin. DIAGNOSIS  Your caregiver can usually tell what is wrong by performing a physical exam. Skin or blood tests may also be done to determine the cause of your hives. In some cases, the cause cannot be determined. TREATMENT  Mild cases usually get better with medicines such as antihistamines. Severe cases may require an emergency epinephrine injection. If the cause of your hives is known, treatment includes avoiding that trigger.  HOME CARE INSTRUCTIONS   Avoid causes that trigger your hives.  Take antihistamines as directed by your caregiver to reduce the severity of your hives. Non-sedating or low-sedating antihistamines are usually recommended. Do not drive while taking an antihistamine.  Take any other medicines prescribed for itching as directed by your caregiver.  Wear loose-fitting clothing.  Keep all follow-up appointments as directed by your caregiver. SEEK MEDICAL CARE IF:   You have persistent or severe itching that is not relieved with medicine.  You have painful or swollen joints. SEEK IMMEDIATE MEDICAL CARE IF:   You have a fever.  Your tongue or lips are swollen.  You have  trouble breathing or swallowing.  You feel tightness in the throat or chest.  You have abdominal pain. These problems may be the first sign of a life-threatening allergic reaction. Call your local emergency services (911 in [place] MAKE SURE YOU:   Understand these instructions.  Will watch your condition.  Will get help right away if you are not doing well or get worse. Document Released: 10/29/2005 Document Revised: 11/03/2013 Document Reviewed: 01/22/2012 Exit[employer] Patient Information 2015 [employer]. This information is not intended to replace advice given to you by your health care provider. Make sure you discuss any questions you have with your health care provider.  

## 2015-08-02 ENCOUNTER — Encounter: Payer: Self-pay | Admitting: Family Medicine

## 2015-08-02 ENCOUNTER — Ambulatory Visit (INDEPENDENT_AMBULATORY_CARE_PROVIDER_SITE_OTHER): Payer: Commercial Managed Care - HMO | Admitting: Family Medicine

## 2015-08-02 VITALS — BP 150/80 | HR 77 | Temp 98.3°F | Ht <= 58 in | Wt 121.0 lb

## 2015-08-02 DIAGNOSIS — I10 Essential (primary) hypertension: Secondary | ICD-10-CM | POA: Diagnosis not present

## 2015-08-02 DIAGNOSIS — E7800 Pure hypercholesterolemia, unspecified: Secondary | ICD-10-CM

## 2015-08-02 DIAGNOSIS — E785 Hyperlipidemia, unspecified: Secondary | ICD-10-CM | POA: Diagnosis not present

## 2015-08-02 DIAGNOSIS — Z23 Encounter for immunization: Secondary | ICD-10-CM | POA: Diagnosis not present

## 2015-08-02 MED ORDER — BENAZEPRIL HCL 5 MG PO TABS: 5.0000 mg | ORAL_TABLET | Freq: Every day | ORAL | Status: DC

## 2015-08-02 NOTE — Assessment & Plan Note (Signed)
Labs pending will assess when labs back and contact patient.

## 2015-08-02 NOTE — Progress Notes (Signed)
BP 150/80 mmHg  Pulse 77  Temp(Src) 98.3 F (36.8 C)  Ht  (1.448 m)  Wt 121 lb (54.885 kg)  BMI 26.18 kg/m2  SpO2 96%  LMP  (LMP Unknown)   Subjective:    Patient ID: Rhonda Ingram, female    DOB: 09-27-1926, 81 y.o.   MRN: [id number]  HPI: Rhonda Ingram is a 81 y.o. female  Chief Complaint  Patient presents with  . Hyperlipidemia  . Hypertension   Elevated blood pressure on Lotensin . Denies headaches, chest pain or dyspnea. Compliant with medications.  Recent episode of allergic reaction of hives and SOB to unknown substance. Treated at ED with diphenhydrmine and sou-medrol.   Patient also for lipid check was unable to do in the office due to elevated triglycerides Most likely elevated due to just finished a prednisone for allergic reaction as noted above  Reviewed ER notes.  Relevant past medical, surgical, family and social history reviewed and updated as indicated. Interim medical history since our last visit reviewed. Allergies and medications reviewed and updated.  Review of Systems  Constitutional: Negative.  Negative for fever, chills, activity change, appetite change and fatigue.  HENT: Negative.  Negative for congestion, postnasal drip, rhinorrhea, sinus pressure, sneezing, sore throat and tinnitus.   Eyes: Negative.  Negative for discharge, redness and itching.  Respiratory: Negative.  Negative for cough, chest tightness, shortness of breath, wheezing and stridor.   Cardiovascular: Negative.  Negative for chest pain, palpitations and leg swelling.  Gastrointestinal: Negative.  Negative for nausea, vomiting, abdominal pain, diarrhea, constipation and blood in stool.  Genitourinary: Negative.  Negative for dysuria, urgency, frequency, hematuria, difficulty urinating and genital sores.  Musculoskeletal: Negative.  Negative for myalgias, back pain, joint swelling, arthralgias and gait problem.  Skin: Negative.  Negative for color change, pallor,  rash and wound.  Neurological: Negative.  Negative for dizziness, tremors, weakness, light-headedness and headaches.  Hematological: Negative.  Does not bruise/bleed easily.  Psychiatric/Behavioral: Negative for suicidal ideas, confusion, sleep disturbance, self-injury and agitation. The patient is not nervous/anxious.     Per HPI unless specifically indicated above     Objective:    BP 150/80 mmHg  Pulse 77  Temp(Src) 98.3 F (36.8 C)  Ht  (1.448 m)  Wt 121 lb (54.885 kg)  BMI 26.18 kg/m2  SpO2 96%  LMP  (LMP Unknown)  Wt Readings from Last 3 Encounters:  08/02/15 121 lb (54.885 kg)  07/20/15 125 lb (56.7 kg)  05/26/15 123 lb 4.8 oz (55.929 kg)    Physical Exam  Constitutional: She is oriented to person, place, and time. She appears well-developed and well-nourished. No distress.  HENT:  Head: Normocephalic and atraumatic.  Right Ear: Hearing normal.  Left Ear: Hearing normal.  Nose: Nose normal.  Eyes: Conjunctivae and lids are normal. Right eye exhibits no discharge. Left eye exhibits no discharge. No scleral icterus.  Cardiovascular: Normal rate, regular rhythm and normal heart sounds.  Exam reveals no gallop and no friction rub.   No murmur heard. Pulmonary/Chest: Effort normal and breath sounds normal. No respiratory distress.  Musculoskeletal: Normal range of motion.  Neurological: She is alert and oriented to person, place, and time.  Skin: Skin is intact. No rash noted.  Psychiatric: She has a normal mood and affect. Her speech is normal and behavior is normal. Judgment and thought content normal. Cognition and memory are normal.    Results for orders placed or performed during the hospital encounter of  07/20/15  CBC with Differential  Result Value Ref Range   WBC 6.1 3.6 - 11.0 K/uL   RBC 4.63 3.80 - 5.20 MIL/uL   Hemoglobin 13.3 12.0 - 16.0 g/dL   HCT 21.3 08.6 - 57.8 %   MCV 86.6 80.0 - 100.0 fL   MCH 28.7 26.0 - 34.0 pg   MCHC 33.1 32.0 - 36.0 g/dL    RDW 46.9 62.9 - 52.8 %   Platelets 199 150 - 440 K/uL   Neutrophils Relative % 56 %   Neutro Abs 3.4 1.4 - 6.5 K/uL   Lymphocytes Relative 29 %   Lymphs Abs 1.8 1.0 - 3.6 K/uL   Monocytes Relative 11 %   Monocytes Absolute 0.7 0.2 - 0.9 K/uL   Eosinophils Relative 3 %   Eosinophils Absolute 0.2 0 - 0.7 K/uL   Basophils Relative 1 %   Basophils Absolute 0.0 0 - 0.1 K/uL  Basic metabolic panel  Result Value Ref Range   Sodium 138 135 - 145 mmol/L   Potassium 3.9 3.5 - 5.1 mmol/L   Chloride 106 101 - 111 mmol/L   CO2 23 22 - 32 mmol/L   Glucose, Bld 120 (H) 65 - 99 mg/dL   BUN 23 (H) 6 - 20 mg/dL   Creatinine, Ser 4.13 0.44 - 1.00 mg/dL   Calcium 9.0 8.9 - 24.4 mg/dL   GFR calc non Af Amer 57 (L) >60 mL/min   GFR calc Af Amer >60 >60 mL/min   Anion gap 9 5 - 15  Troponin I  Result Value Ref Range   Troponin I <0.03 <0.031 ng/mL      Assessment & Plan:   Problem List Items Addressed This Visit      Cardiovascular and Mediastinum   Hypertension    Discussed blood pressure elevated but may still be residual from emergency room visit. Don't want blood pressure too low because of risk of falling. Patient will monitor at home and we will recheck in 1-2 months For decision about changing medication.       Relevant Medications   benazepril (LOTENSIN) 5 MG tablet     Other   Hyperlipidemia    Labs pending will assess when labs back and contact patient.      Relevant Medications   benazepril (LOTENSIN) 5 MG tablet    Other Visit Diagnoses    Essential hypertension, benign    -  Primary    Relevant Medications    benazepril (LOTENSIN) 5 MG tablet    Other Relevant Orders    Basic metabolic panel    Pure hypercholesterolemia        Relevant Medications    benazepril (LOTENSIN) 5 MG tablet    Other Relevant Orders    LP+ALT+AST Piccolo, Waived    Immunization due        Relevant Orders    Flu Vaccine QUAD 36+ mos PF IM (Fluarix & Fluzone Quad PF) (Completed)         Follow up plan: Return in about 2 months (around 10/02/2015), or if symptoms worsen or fail to improve, for Blood pressure recheck.

## 2015-08-02 NOTE — Assessment & Plan Note (Signed)
Discussed blood pressure elevated but may still be residual from emergency room visit. Don't want blood pressure too low because of risk of falling. Patient will monitor at home and we will recheck in 1-2 months For decision about changing medication.

## 2015-08-03 ENCOUNTER — Encounter: Payer: Self-pay | Admitting: Family Medicine

## 2015-08-03 LAB — BASIC METABOLIC PANEL
BUN/Creatinine Ratio: 20 (ref 11–26)
BUN: 15 mg/dL (ref 8–27)
CO2: 24 mmol/L (ref 18–29)
Calcium: 9.3 mg/dL (ref 8.7–10.3)
Chloride: 100 mmol/L (ref 97–108)
Creatinine, Ser: 0.74 mg/dL (ref 0.57–1.00)
GFR calc Af Amer: 84 mL/min/{1.73_m2} (ref >59)
GFR calc non Af Amer: 73 mL/min/{1.73_m2} (ref >59)
Glucose: 128 mg/dL — ABNORMAL HIGH (ref 65–99)
Potassium: 4.1 mmol/L (ref 3.5–5.2)
Sodium: 140 mmol/L (ref 134–144)

## 2015-08-03 LAB — LP+ALT+AST PICCOLO, WAIVED
ALT (SGPT) Piccolo, Waived: 11 U/L (ref 10–47)
AST (SGOT) Piccolo, Waived: 28 U/L (ref 11–38)

## 2015-08-04 LAB — LIPID PANEL W/O CHOL/HDL RATIO
Cholesterol, Total: 220 mg/dL — ABNORMAL HIGH (ref 100–199)
HDL: 49 mg/dL (ref >39)
Triglycerides: 503 mg/dL — ABNORMAL HIGH (ref 0–149)

## 2015-08-04 LAB — SPECIMEN STATUS REPORT

## 2015-10-03 ENCOUNTER — Ambulatory Visit (INDEPENDENT_AMBULATORY_CARE_PROVIDER_SITE_OTHER): Payer: Commercial Managed Care - HMO | Admitting: Family Medicine

## 2015-10-03 ENCOUNTER — Encounter: Payer: Self-pay | Admitting: Family Medicine

## 2015-10-03 VITALS — BP 124/78 | HR 78 | Temp 97.8°F | Ht 58.2 in | Wt 119.0 lb

## 2015-10-03 DIAGNOSIS — I10 Essential (primary) hypertension: Secondary | ICD-10-CM | POA: Diagnosis not present

## 2015-10-03 DIAGNOSIS — E785 Hyperlipidemia, unspecified: Secondary | ICD-10-CM

## 2015-10-03 NOTE — Assessment & Plan Note (Signed)
The current medical regimen is effective;  continue present plan and medications.  

## 2015-10-03 NOTE — Progress Notes (Signed)
BP 124/78 mmHg  Pulse 78  Temp(Src) 97.8 F (36.6 C)  Ht 4' 10.2" (1.478 m)  Wt 119 lb (53.978 kg)  BMI 24.71 kg/m2  SpO2 99%  LMP  (LMP Unknown)   Subjective:    Patient ID: Rhonda Ingram, female    DOB: 08/23/1926, 81 y.o.   MRN: [id number]  HPI: Rhonda Ingram is a 81 y.o. female  Chief Complaint  Patient presents with  . Hypertension   she doing well with blood pressure medications taking that every day without symptoms are concerns Patient had just gotten off prednisone for allergic reaction and blood pressure doing much better Lipids also doing well with no concerns or complaints Lipids doing well on medications Relevant past medical, surgical, family and social history reviewed and updated as indicated. Interim medical history since our last visit reviewed. Allergies and medications reviewed and updated.  Review of Systems  Constitutional: Negative.   Respiratory: Negative.   Cardiovascular: Negative.     Per HPI unless specifically indicated above     Objective:    BP 124/78 mmHg  Pulse 78  Temp(Src) 97.8 F (36.6 C)  Ht 4' 10.2" (1.478 m)  Wt 119 lb (53.978 kg)  BMI 24.71 kg/m2  SpO2 99%  LMP  (LMP Unknown)  Wt Readings from Last 3 Encounters:  10/03/15 119 lb (53.978 kg)  08/02/15 121 lb (54.885 kg)  07/20/15 125 lb (56.7 kg)    Physical Exam  Constitutional: She is oriented to person, place, and time. She appears well-developed and well-nourished. No distress.  HENT:  Head: Normocephalic and atraumatic.  Right Ear: Hearing normal.  Left Ear: Hearing normal.  Nose: Nose normal.  Eyes: Conjunctivae and lids are normal. Right eye exhibits no discharge. Left eye exhibits no discharge. No scleral icterus.  Cardiovascular: Normal rate, regular rhythm and normal heart sounds.   Pulmonary/Chest: Effort normal and breath sounds normal. No respiratory distress.  Musculoskeletal: Normal range of motion.  Neurological: She is alert and oriented to  person, place, and time.  Skin: Skin is intact. No rash noted.  Psychiatric: She has a normal mood and affect. Her speech is normal and behavior is normal. Judgment and thought content normal. Cognition and memory are normal.    Results for orders placed or performed in visit on 08/02/15  LP+ALT+AST Piccolo, [employer]Waived  Result Value Ref Range   ALT (SGPT) Piccolo, Waived 11 10 - 47 U/L   AST (SGOT) Piccolo, Waived 28 11 - 38 U/L   Cholesterol Piccolo, Waived CANCELED    HDL Chol Piccolo, Waived CANCELED    Triglycerides Piccolo,Waived CANCELED   Basic metabolic panel  Result Value Ref Range   Glucose 128 (H) 65 - 99 mg/dL   BUN 15 8 - 27 mg/dL   Creatinine, Ser 7.840.74 0.57 - 1.00 mg/dL   GFR calc non Af Amer 73 >59 mL/min/1.73   GFR calc Af Amer 84 >59 mL/min/1.73   BUN/Creatinine Ratio 20 11 - 26   Sodium 140 134 - 144 mmol/L   Potassium 4.1 3.5 - 5.2 mmol/L   Chloride 100 97 - 108 mmol/L   CO2 24 18 - 29 mmol/L   Calcium 9.3 8.7 - 10.3 mg/dL  Specimen status report  Result Value Ref Range   specimen status report Comment   Lipid Panel w/o Chol/HDL Ratio  Result Value Ref Range   Cholesterol, Total 220 (H) 100 - 199 mg/dL   Triglycerides 696503 (H) 0 - 149 mg/dL  HDL 49 >39 mg/dL   VLDL Cholesterol Cal Comment 5 - 40 mg/dL   LDL Calculated Comment 0 - 99 mg/dL      Assessment & Plan:   Problem List Items Addressed This Visit      Cardiovascular and Mediastinum   Hypertension - Primary    The current medical regimen is effective;  continue present plan and medications.         Other   Hyperlipidemia    The current medical regimen is effective;  continue present plan and medications.           Follow up plan: Return in about 6 months (around 04/01/2016), or if symptoms worsen or fail to improve, for Physical Exam.

## 2015-10-13 ENCOUNTER — Telehealth: Payer: Self-pay | Admitting: Family Medicine

## 2015-10-13 NOTE — Telephone Encounter (Signed)
Pt needs 10 days worth of Pravastatin 40mg  called [facility], she is waiting for her mail order to come in.

## 2015-10-14 NOTE — Telephone Encounter (Signed)
Pt called and stated that her meds came later yesterday so she doesn't need pravastatin sent to [facility].

## 2015-11-08 ENCOUNTER — Telehealth: Payer: Self-pay | Admitting: Family Medicine

## 2015-11-08 ENCOUNTER — Emergency Department: Payer: Commercial Managed Care - HMO

## 2015-11-08 ENCOUNTER — Emergency Department
Admission: EM | Admit: 2015-11-08 | Discharge: 2015-11-08 | Disposition: A | Discharge status: Home / Self Care | Payer: Commercial Managed Care - HMO | Attending: Emergency Medicine | Admitting: Emergency Medicine

## 2015-11-08 DIAGNOSIS — Y9289 Other specified places as the place of occurrence of the external cause: Secondary | ICD-10-CM | POA: Insufficient documentation

## 2015-11-08 DIAGNOSIS — L299 Pruritus, unspecified: Secondary | ICD-10-CM | POA: Diagnosis present

## 2015-11-08 DIAGNOSIS — Y998 Other external cause status: Secondary | ICD-10-CM | POA: Insufficient documentation

## 2015-11-08 DIAGNOSIS — Z79899 Other long term (current) drug therapy: Secondary | ICD-10-CM | POA: Insufficient documentation

## 2015-11-08 DIAGNOSIS — T7840XA Allergy, unspecified, initial encounter: Secondary | ICD-10-CM | POA: Diagnosis not present

## 2015-11-08 DIAGNOSIS — X58XXXA Exposure to other specified factors, initial encounter: Secondary | ICD-10-CM | POA: Diagnosis not present

## 2015-11-08 DIAGNOSIS — Y9389 Activity, other specified: Secondary | ICD-10-CM | POA: Diagnosis not present

## 2015-11-08 DIAGNOSIS — R0602 Shortness of breath: Secondary | ICD-10-CM | POA: Diagnosis not present

## 2015-11-08 DIAGNOSIS — R05 Cough: Secondary | ICD-10-CM | POA: Diagnosis not present

## 2015-11-08 DIAGNOSIS — I10 Essential (primary) hypertension: Secondary | ICD-10-CM | POA: Insufficient documentation

## 2015-11-08 LAB — CBC WITH DIFFERENTIAL/PLATELET
Basophils Absolute: 0.1 10*3/uL (ref 0–0.1)
Basophils Relative: 1 %
Eosinophils Absolute: 0.3 10*3/uL (ref 0–0.7)
Eosinophils Relative: 4 %
HCT: 40.4 % (ref 35.0–47.0)
Hemoglobin: 13.3 g/dL (ref 12.0–16.0)
Lymphocytes Relative: 30 %
Lymphs Abs: 2.2 10*3/uL (ref 1.0–3.6)
MCH: 28.6 pg (ref 26.0–34.0)
MCHC: 33 g/dL (ref 32.0–36.0)
MCV: 86.6 fL (ref 80.0–100.0)
Monocytes Absolute: 0.8 10*3/uL (ref 0.2–0.9)
Monocytes Relative: 11 %
Neutro Abs: 4.1 10*3/uL (ref 1.4–6.5)
Neutrophils Relative %: 54 %
Platelets: 195 10*3/uL (ref 150–440)
RBC: 4.66 MIL/uL (ref 3.80–5.20)
RDW: 13.8 % (ref 11.5–14.5)
WBC: 7.5 10*3/uL (ref 3.6–11.0)

## 2015-11-08 LAB — COMPREHENSIVE METABOLIC PANEL
ALT: 16 U/L (ref 14–54)
AST: 20 U/L (ref 15–41)
Albumin: 3.4 g/dL — ABNORMAL LOW (ref 3.5–5.0)
Alkaline Phosphatase: 46 U/L (ref 38–126)
Anion gap: 6 (ref 5–15)
BUN: 23 mg/dL — ABNORMAL HIGH (ref 6–20)
CO2: 27 mmol/L (ref 22–32)
Calcium: 9.1 mg/dL (ref 8.9–10.3)
Chloride: 106 mmol/L (ref 101–111)
Creatinine, Ser: 0.85 mg/dL (ref 0.44–1.00)
GFR calc Af Amer: >60 mL/min (ref >60)
GFR calc non Af Amer: 60 mL/min — ABNORMAL LOW (ref >60)
Glucose, Bld: 130 mg/dL — ABNORMAL HIGH (ref 65–99)
Potassium: 3.7 mmol/L (ref 3.5–5.1)
Sodium: 139 mmol/L (ref 135–145)
Total Bilirubin: 0.3 mg/dL (ref 0.3–1.2)
Total Protein: 7.4 g/dL (ref 6.5–8.1)

## 2015-11-08 LAB — TROPONIN I
Troponin I: <0.03 ng/mL (ref <0.031)
Troponin I: <0.03 ng/mL (ref <0.031)

## 2015-11-08 LAB — BRAIN NATRIURETIC PEPTIDE: B Natriuretic Peptide: 600 pg/mL — ABNORMAL HIGH (ref 0.0–100.0)

## 2015-11-08 MED ORDER — DEXAMETHASONE SODIUM PHOSPHATE 10 MG/ML IJ SOLN
10.0000 mg | Freq: Once | INTRAMUSCULAR | Status: AC
  Administered 2015-11-08: 10 mg via INTRAVENOUS
  Filled 2015-11-08: qty 1

## 2015-11-08 NOTE — ED Notes (Addendum)
Pt to triage via w/c with frequent coughing noted; [facility]; denies pain but reports chest "tightness", denies recent illness

## 2015-11-08 NOTE — ED Provider Notes (Signed)
[facility] Emergency Department Provider Note  ____________________________________________  Time seen: Approximately 4:10 AM  I have reviewed the triage vital signs and the nursing notes.   HISTORY  Chief Complaint Shortness of Breath    HPI Rhonda Ingram is a 81 y.o. female who reports she got all red and itchy earlier this morning. The redness and itchiness was over her whole body. She took a Benadryl and came to the emergency room. Patient feels better now. Reports she's had one of these attacks before and came to the emergency room which is where she got the Benadryl. Patient has seen her doctor twice since then but does not remember if she told him about these episodes.   Past Medical History  Diagnosis Date  . Vitamin D deficiency   . Hyperlipidemia   . Osteoporosis   . Hypertension   . Arthritis     osteoarthritis    Patient Active Problem List   Diagnosis Date Noted  . Hypertension 08/02/2015  . Hyperlipidemia 08/02/2015  . Tinea pedis 05/26/2015    Past Surgical History  Procedure Laterality Date  . Cholecystectomy    . Eye surgery      cataract extraction    Current Outpatient Rx  Name  Route  Sig  Dispense  Refill  . benazepril (LOTENSIN) 5 MG tablet   Oral   Take 1 tablet (5 mg total) by mouth daily.   90 tablet   1   . pravastatin (PRAVACHOL) 40 MG tablet   Oral   Take 1 tablet (40 mg total) by mouth daily.   90 tablet   2   . diphenhydrAMINE (BENADRYL) 25 mg capsule   Oral   Take 1 capsule (25 mg total) by mouth every 4 (four) hours as needed.   30 capsule   2     Allergies Review of patient's allergies indicates no known allergies.  Family History  Problem Relation Age of Onset  . Hypertension Mother   . Stroke Mother   . Cancer Father     lung  . Diabetes Brother   . Hypertension Brother   . Hyperlipidemia Brother   . Hypertension Sister   . Hyperlipidemia Sister   . Hypertension Daughter   .  Cerebral palsy Daughter   . Hypertension Maternal Grandmother   . Hypertension Maternal Grandfather   . Hypertension Paternal Grandmother   . Hypertension Paternal Grandfather   . Hypertension Sister   . Hyperlipidemia Sister   . Migraines Daughter     Social History Social History  Substance Use Topics  . Smoking status: Never Smoker   . Smokeless tobacco: Never Used  . Alcohol Use: No    Review of Systems Constitutional: No fever/chills Eyes: No visual changes. ENT: No sore throat. Cardiovascular: Denies chest pain. Respiratory: Patient had shortness of breath but this is resolved since she took some Benadryl Gastrointestinal: No abdominal pain.  No nausea, no vomiting.  No diarrhea.  No constipation. Genitourinary: Negative for dysuria. Musculoskeletal: Negative for back pain. Skin: Negative for rash. Neurological: Negative for headaches, focal weakness or numbness. \ 10-point ROS otherwise negative.  ____________________________________________   PHYSICAL EXAM:  VITAL SIGNS: ED Triage Vitals  Enc Vitals Group     BP 11/08/15 0349 179/92 mmHg     Pulse Rate 11/08/15 0349 80     Resp 11/08/15 0349 22     Temp 11/08/15 0349 97.5 F (36.4 C)     Temp Source 11/08/15 0349 Oral  SpO2 11/08/15 0349 95 %     Weight 11/08/15 0349 120 lb (54.432 kg)     Height 11/08/15 0349 5' (1.524 m)     Head Cir --      Peak Flow --      Pain Score --      Pain Loc --      Pain Edu? --      Excl. in GC? --     Constitutional: Alert and oriented. Well appearing and in no acute distress. Eyes: Conjunctivae are normal. PERRL. EOMI. Head: Atraumatic. Nose: No congestion/rhinnorhea. Mouth/Throat: Mucous membranes are moist.  Oropharynx non-erythematous. Neck: No stridor.  Cardiovascular: Normal rate, regular rhythm. Grossly normal heart sounds.  Good peripheral circulation. Respiratory: Normal respiratory effort.  No retractions. Lungs CTAB. Gastrointestinal: Soft and  nontender. No distention. No abdominal bruits. No CVA tenderness. Musculoskeletal: No lower extremity tenderness nor edema.  No joint effusions.  Skin:  Skin is warm, dry and intact. No rash noted.   ____________________________________________   LABS (all labs ordered are listed, but only abnormal results are displayed)  Labs Reviewed  COMPREHENSIVE METABOLIC PANEL - Abnormal; Notable for the following:    Glucose, Bld 130 (*)    BUN 23 (*)    Albumin 3.4 (*)    GFR calc non Af Amer 60 (*)    All other components within normal limits  BRAIN NATRIURETIC PEPTIDE - Abnormal; Notable for the following:    B Natriuretic Peptide 600.0 (*)    All other components within normal limits  TROPONIN I  CBC WITH DIFFERENTIAL/PLATELET  TROPONIN I   ____________________________________________  EKG  EKG read and interpreted by me shows normal sinus rhythm a rate of 67 normal axis nonsignificant ST elevation in III and F EKG is otherwise normal. Patient has no symptoms of chest pain or shortness of breath at present time ____________________________________________  RADIOLOGY  Chest x-ray read and interpreted by me  ____________________________________________   PROCEDURES    ____________________________________________   INITIAL IMPRESSION / ASSESSMENT AND PLAN / ED COURSE  Pertinent labs & imaging results that were available during my care of the patient were reviewed by me and considered in my medical decision making (see chart for details).   ____________________________________________   FINAL CLINICAL IMPRESSION(S) / ED DIAGNOSES  Final diagnoses:  Allergic reaction, initial encounter      Arnaldo Natal, MD 11/08/15 [phone]

## 2015-11-08 NOTE — Telephone Encounter (Signed)
Unfortunately she needs to be seen

## 2015-11-08 NOTE — Discharge Instructions (Signed)
Allergy Skin Testing WHY AM I HAVING THIS TEST? Allergy skin testing is done to check whether you have an allergy to something. Testing may be done in one of two ways:  Injecting a small amount of the substance you may be allergic to (allergen).  Applying patches to your skin. Your health care provider will determine the results of your test by checking for an allergic reaction on the skin where the allergen was injected or where the patches were applied.  HOW DO I PREPARE FOR THE TEST?  Let your health care provider know about all medicines you are taking, including vitamins, herbs, eye drops, creams, and over-the-counter medicines. Some medicines can affect test results. Your health care provider will let you know when to stop taking those medicines and when you can begin taking them again.  If you are having a patch test:  Do not apply ointments, creams, or lotion to the skin where the patch will be placed. Usually, the patches are placed on your forearm or on your back.  Bring any items that you think you are allergic to, such as cosmetics, soaps, and perfume. WILL I NEED TO DO ANYTHING AT HOME? If you will receive an injection, you will not need to do anything at home. If patches will be applied to your skin, you will need to:  Wear them for 48 hours.  Return to your health care provider's office to have them removed. Do not remove them yourself.  Avoid bathing and activities that cause heavy sweating until after the patches are removed. WHAT ARE THE REFERENCE RANGES? Reference ranges are considered healthy ranges established after testing a large group of healthy people. Reference ranges may vary among different people, labs, and hospitals.  The reference range for allergy skin testing is a swollen area of skin (wheal) less than 3mm in diameter, with surrounding redness and swelling (flare) less than 10mm in diameter.  WHAT DO THE RESULTS MEAN?  A result within the reference range  means you are probably not allergic to the allergen.  A result in which the wheal is 3mm or more and the flare is 10mm or more means you are likely allergic to the allergen. Your health care provider will consider the results of your test in addition to your symptoms before diagnosing you with an allergy. Talk with your health care provider to discuss your results, treatment options, and if necessary, the need for more tests. Talk with your health care provider if you have any questions about your results.   This information is not intended to replace advice given to you by your health care provider. Make sure you discuss any questions you have with your health care provider.   Document Released: 11/21/2004 Document Revised: 11/19/2014 Document Reviewed: 08/10/2014 Elsevier Interactive Patient Education 2016 [employer].  Allergies An allergy is when your body reacts to a substance in a way that is not normal. An allergic reaction can happen after you:  Eat something.  Breathe in something.  Touch something. WHAT KINDS OF ALLERGIES ARE THERE? You can be allergic to:  Things that are only around during certain seasons, like molds and pollens.  Foods.  Drugs.  Insects.  Animal dander. WHAT ARE SYMPTOMS OF ALLERGIES?  Puffiness (swelling). This may happen on the lips, face, tongue, mouth, or throat.  Sneezing.  Coughing.  Breathing loudly (wheezing).  Stuffy nose.  Tingling in the mouth.  A rash.  Itching.  Itchy, red, puffy areas of skin (hives).  Watery eyes.  Throwing up (vomiting).  Watery poop (diarrhea).  Dizziness.  Feeling faint or fainting.  Trouble breathing or swallowing.  A tight feeling in the chest.  A fast heartbeat. HOW ARE ALLERGIES DIAGNOSED? Allergies can be diagnosed with:  A medical and family history.  Skin tests.  Blood tests.  A food diary. A food diary is a record of all the foods, drinks, and symptoms you have each  day.  The results of an elimination diet. This diet involves making sure not to eat certain foods and then seeing what happens when you start eating them again. HOW ARE ALLERGIES TREATED? There is no cure for allergies, but allergic reactions can be treated with medicine. Severe reactions usually need to be treated at a hospital.  HOW CAN REACTIONS BE PREVENTED? The best way to prevent an allergic reaction is to avoid the thing you are allergic to. Allergy shots and medicines can also help prevent reactions in some cases.   This information is not intended to replace advice given to you by your health care provider. Make sure you discuss any questions you have with your health care provider.   Document Released: 02/23/2013 Document Revised: 11/19/2014 Document Reviewed: 08/10/2014 Elsevier Interactive Patient Education 2016 [employer]   Please take the Benadryl one pill 4 times a day for the next day. Please follow-up with your doctor. I would ask him for a referral to an allergist for skin testing so we can see what's causing these episodes of redness, itching and shortness of breath. If you have another such episode please take another Benadryl and return here if you're not better quickly. If you have shortness of breath I would call 911 and have them bring in.

## 2015-11-08 NOTE — Telephone Encounter (Signed)
Pt has been seen in the ER for a allergic reaction(around sept 9 and dec 26) and she stated that the ER suggested she follow up with her primary to get a referral to get a allergy test and she would like to know if that can be done without coming in since she was just seen in the ER last night.

## 2015-11-08 NOTE — ED Notes (Addendum)
Pt reports waking up with "red skin" and trouble breathing while sleeping. Pt reports taking Benadryl and cough syrup before coming. No redness noted at this time. Pt reports decrease in redness and decrease in itchiness at this time.    11/08/15 0400  Skin Color/Condition  Skin Color/Condition (WDL) X  Skin Integrity Intact  Skin Condition Dry  Skin Intact;Dry  Mucous Membranes Moist

## 2015-11-08 NOTE — ED Notes (Signed)
Pt c/o tightness in chest with red rash and itchy rash. Pt reports much relief after taking Benadryl.   11/08/15 0400  Respiratory  Respiratory (WDL) X  Bilateral Breath Sounds Clear

## 2015-11-09 NOTE — Telephone Encounter (Signed)
Patient will need to come in and be seen for referral, please call and get on schedule

## 2015-11-16 ENCOUNTER — Telehealth: Payer: Self-pay | Admitting: Family Medicine

## 2015-11-16 ENCOUNTER — Encounter: Payer: Self-pay | Admitting: Family Medicine

## 2015-11-16 ENCOUNTER — Ambulatory Visit (INDEPENDENT_AMBULATORY_CARE_PROVIDER_SITE_OTHER): Payer: Commercial Managed Care - HMO | Admitting: Family Medicine

## 2015-11-16 VITALS — BP 147/76 | HR 74 | Temp 97.8°F | Ht 58.2 in | Wt 120.0 lb

## 2015-11-16 DIAGNOSIS — E785 Hyperlipidemia, unspecified: Secondary | ICD-10-CM

## 2015-11-16 DIAGNOSIS — T7840XA Allergy, unspecified, initial encounter: Secondary | ICD-10-CM

## 2015-11-16 MED ORDER — FEXOFENADINE HCL 60 MG PO TABS: 60.0000 mg | ORAL_TABLET | Freq: Two times a day (BID) | ORAL | Status: DC

## 2015-11-16 NOTE — Progress Notes (Signed)
BP 147/76 mmHg  Pulse 74  Temp(Src) 97.8 F (36.6 C)  Ht 4' 10.2" (1.478 m)  Wt 120 lb (54.432 kg)  BMI 24.92 kg/m2  SpO2 99%  LMP  (LMP Unknown)   Subjective:    Patient ID: Rhonda Ingram, female    DOB: 07/12/1926, 82 y.o.   MRN: [id number]  HPI: Rhonda ProudMary M Sonnen is a 82 y.o. female  Chief Complaint  Patient presents with  . allergic reactions   reviewed patient's ER notes for allergic reactions patient's been taking Benadryl at night as she can't take it during the day from 2 much drowsiness Had no further episodes as discussed in the ER note Reviewed medication taking Benzapril 5 and pravastatin 40 mg No other new medications changes are introduced substances cleaners soaps detergents and lotions etc.  Relevant past medical, surgical, family and social history reviewed and updated as indicated. Interim medical history since our last visit reviewed. Allergies and medications reviewed and updated.  Review of Systems  Constitutional: Negative.   Respiratory: Negative.   Cardiovascular: Negative.   Skin: Negative.     Per HPI unless specifically indicated above     Objective:    BP 147/76 mmHg  Pulse 74  Temp(Src) 97.8 F (36.6 C)  Ht 4' 10.2" (1.478 m)  Wt 120 lb (54.432 kg)  BMI 24.92 kg/m2  SpO2 99%  LMP  (LMP Unknown)  Wt Readings from Last 3 Encounters:  11/16/15 120 lb (54.432 kg)  11/08/15 120 lb (54.432 kg)  10/03/15 119 lb (53.978 kg)    Physical Exam  Constitutional: She is oriented to person, place, and time. She appears well-developed and well-nourished. No distress.  HENT:  Head: Normocephalic and atraumatic.  Right Ear: Hearing normal.  Left Ear: Hearing normal.  Nose: Nose normal.  Eyes: Conjunctivae and lids are normal. Right eye exhibits no discharge. Left eye exhibits no discharge. No scleral icterus.  Cardiovascular: Normal rate, regular rhythm and normal heart sounds.   Pulmonary/Chest: Effort normal and breath sounds normal. No  respiratory distress.  Musculoskeletal: Normal range of motion.  Neurological: She is alert and oriented to person, place, and time.  Skin: Skin is warm, dry and intact. No rash noted. No erythema.  Psychiatric: She has a normal mood and affect. Her speech is normal and behavior is normal. Judgment and thought content normal. Cognition and memory are normal.    Results for orders placed or performed during the hospital encounter of 11/08/15  Comprehensive metabolic panel  Result Value Ref Range   Sodium 139 135 - 145 mmol/L   Potassium 3.7 3.5 - 5.1 mmol/L   Chloride 106 101 - 111 mmol/L   CO2 27 22 - 32 mmol/L   Glucose, Bld 130 (H) 65 - 99 mg/dL   BUN 23 (H) 6 - 20 mg/dL   Creatinine, Ser 4.090.85 0.44 - 1.00 mg/dL   Calcium 9.1 8.9 - 81.110.3 mg/dL   Total Protein 7.4 6.5 - 8.1 g/dL   Albumin 3.4 (L) 3.5 - 5.0 g/dL   AST 20 15 - 41 U/L   ALT 16 14 - 54 U/L   Alkaline Phosphatase 46 38 - 126 U/L   Total Bilirubin 0.3 0.3 - 1.2 mg/dL   GFR calc non Af Amer 60 (L) >60 mL/min   GFR calc Af Amer >60 >60 mL/min   Anion gap 6 5 - 15  Troponin I  Result Value Ref Range   Troponin I <0.03 <0.031 ng/mL  Brain  natriuretic peptide  Result Value Ref Range   B Natriuretic Peptide 600.0 (H) 0.0 - 100.0 pg/mL  CBC with Differential  Result Value Ref Range   WBC 7.5 3.6 - 11.0 K/uL   RBC 4.66 3.80 - 5.20 MIL/uL   Hemoglobin 13.3 12.0 - 16.0 g/dL   HCT 69.6 29.5 - 28.4 %   MCV 86.6 80.0 - 100.0 fL   MCH 28.6 26.0 - 34.0 pg   MCHC 33.0 32.0 - 36.0 g/dL   RDW 13.2 44.0 - 10.2 %   Platelets 195 150 - 440 K/uL   Neutrophils Relative % 54 %   Neutro Abs 4.1 1.4 - 6.5 K/uL   Lymphocytes Relative 30 %   Lymphs Abs 2.2 1.0 - 3.6 K/uL   Monocytes Relative 11 %   Monocytes Absolute 0.8 0.2 - 0.9 K/uL   Eosinophils Relative 4 %   Eosinophils Absolute 0.3 0 - 0.7 K/uL   Basophils Relative 1 %   Basophils Absolute 0.1 0 - 0.1 K/uL  Troponin I  Result Value Ref Range   Troponin I <0.03 <0.031  ng/mL      Assessment & Plan:   Problem List Items Addressed This Visit      Other   Hyperlipidemia    Chance of pravastatin causing patient's allergic reaction we will discontinue and observe patient's response      Allergic reaction - Primary    Unknown cause of allergic reaction will discontinue Benadryl for safety reasons Start Allegra and give patient prescription We will try and observe for medicine induced by stopping pravastatin and observe response          Follow up plan: Return in about 3 months (around 02/14/2016), or if symptoms worsen or fail to improve, for Physical Exam .

## 2015-11-16 NOTE — Telephone Encounter (Signed)
Pt came back stated she needs RX for Allegra [facility] to [facility] [place]. Thanks.

## 2015-11-16 NOTE — Assessment & Plan Note (Signed)
Chance of pravastatin causing patient's allergic reaction we will discontinue and observe patient's response

## 2015-11-16 NOTE — Assessment & Plan Note (Signed)
Unknown cause of allergic reaction will discontinue Benadryl for safety reasons Start Allegra and give patient prescription We will try and observe for medicine induced by stopping pravastatin and observe response

## 2016-01-17 ENCOUNTER — Telehealth: Payer: Self-pay | Admitting: Family Medicine

## 2016-01-17 MED ORDER — BENAZEPRIL HCL 5 MG PO TABS: 5.0000 mg | ORAL_TABLET | Freq: Every day | ORAL | Status: DC

## 2016-01-17 NOTE — Telephone Encounter (Signed)
Pt called stated she has an appt 02/14/16 but will run out of her BP medication before the appt. Can enough be called in to last until her appt date?   Benazepril   Pharm is [facility]. Thanks.

## 2016-02-14 ENCOUNTER — Encounter: Payer: Self-pay | Admitting: Family Medicine

## 2016-02-14 ENCOUNTER — Ambulatory Visit (INDEPENDENT_AMBULATORY_CARE_PROVIDER_SITE_OTHER): Payer: Commercial Managed Care - HMO | Admitting: Family Medicine

## 2016-02-14 VITALS — BP 152/68 | HR 76 | Temp 97.9°F | Ht <= 58 in | Wt 119.0 lb

## 2016-02-14 DIAGNOSIS — Z0000 Encounter for general adult medical examination without abnormal findings: Secondary | ICD-10-CM | POA: Diagnosis not present

## 2016-02-14 DIAGNOSIS — E785 Hyperlipidemia, unspecified: Secondary | ICD-10-CM | POA: Diagnosis not present

## 2016-02-14 DIAGNOSIS — Z23 Encounter for immunization: Secondary | ICD-10-CM | POA: Diagnosis not present

## 2016-02-14 DIAGNOSIS — N390 Urinary tract infection, site not specified: Secondary | ICD-10-CM

## 2016-02-14 DIAGNOSIS — I10 Essential (primary) hypertension: Secondary | ICD-10-CM

## 2016-02-14 DIAGNOSIS — R8281 Pyuria: Secondary | ICD-10-CM

## 2016-02-14 LAB — URINALYSIS, ROUTINE W REFLEX MICROSCOPIC
Bilirubin, UA: NEGATIVE
Glucose, UA: NEGATIVE
Ketones, UA: NEGATIVE
Nitrite, UA: NEGATIVE
Protein, UA: NEGATIVE
Specific Gravity, UA: 1.015 (ref 1.005–1.030)
Urobilinogen, Ur: 0.2 mg/dL (ref 0.2–1.0)
pH, UA: 6 (ref 5.0–7.5)

## 2016-02-14 LAB — MICROSCOPIC EXAMINATION: Epithelial Cells (non renal): >10 /hpf — AB (ref 0–10)

## 2016-02-14 MED ORDER — BENAZEPRIL HCL 5 MG PO TABS: 5.0000 mg | ORAL_TABLET | Freq: Every day | ORAL | Status: DC

## 2016-02-14 NOTE — Assessment & Plan Note (Signed)
The current medical regimen is effective;  continue present plan and medications.  

## 2016-02-14 NOTE — Progress Notes (Signed)
BP 152/68 mmHg  Pulse 76  Temp(Src) 97.9 F (36.6 C)  Ht 4' 9.5" (1.461 m)  Wt 119 lb (53.978 kg)  BMI 25.29 kg/m2  SpO2 97%  LMP  (LMP Unknown)   Subjective:    Patient ID: Rhonda Ingram, female    DOB: 04/15/1926, 82 y.o.   MRN: [id number]  HPI: Rhonda ProudMary M Mapps is a 82 y.o. female  Chief Complaint  Patient presents with  . Annual Exam  Patient doing well with blood pressure taking 5 mg Benzapril without problems Patient having some itchy spots on her skin with seborrheic keratosis on left back area showing some excoriation. HAs a mild URI just getting better.  Relevant past medical, surgical, family and social history reviewed and updated as indicated. Interim medical history since our last visit reviewed. Allergies and medications reviewed and updated.  Review of Systems  Constitutional: Negative.   HENT: Negative.   Eyes: Negative.   Respiratory: Negative.   Cardiovascular: Negative.   Gastrointestinal: Negative.   Endocrine: Negative.   Genitourinary: Negative.   Musculoskeletal: Negative.   Skin: Negative.   Allergic/Immunologic: Negative.   Neurological: Negative.   Hematological: Negative.   Psychiatric/Behavioral: Negative.     Per HPI unless specifically indicated above     Objective:    BP 152/68 mmHg  Pulse 76  Temp(Src) 97.9 F (36.6 C)  Ht 4' 9.5" (1.461 m)  Wt 119 lb (53.978 kg)  BMI 25.29 kg/m2  SpO2 97%  LMP  (LMP Unknown)  Wt Readings from Last 3 Encounters:  02/14/16 119 lb (53.978 kg)  11/16/15 120 lb (54.432 kg)  11/08/15 120 lb (54.432 kg)    Physical Exam  Constitutional: She is oriented to person, place, and time. She appears well-developed and well-nourished.  HENT:  Head: Normocephalic and atraumatic.  Right Ear: External ear normal.  Left Ear: External ear normal.  Nose: Nose normal.  Mouth/Throat: Oropharynx is clear and moist.  Eyes: Conjunctivae and EOM are normal. Pupils are equal, round, and reactive to light.   Neck: Normal range of motion. Neck supple. Carotid bruit is not present.  Cardiovascular: Normal rate, regular rhythm and normal heart sounds.   No murmur heard. Pulmonary/Chest: Effort normal and breath sounds normal. She exhibits no mass. Right breast exhibits no mass, no skin change and no tenderness. Left breast exhibits no mass, no skin change and no tenderness. Breasts are symmetrical.  Abdominal: Soft. Bowel sounds are normal. There is no hepatosplenomegaly.  Musculoskeletal: Normal range of motion.  Neurological: She is alert and oriented to person, place, and time.  Skin: No rash noted.  Psychiatric: She has a normal mood and affect. Her behavior is normal. Judgment and thought content normal.    Results for orders placed or performed during the hospital encounter of 11/08/15  Comprehensive metabolic panel  Result Value Ref Range   Sodium 139 135 - 145 mmol/L   Potassium 3.7 3.5 - 5.1 mmol/L   Chloride 106 101 - 111 mmol/L   CO2 27 22 - 32 mmol/L   Glucose, Bld 130 (H) 65 - 99 mg/dL   BUN 23 (H) 6 - 20 mg/dL   Creatinine, Ser 4.090.85 0.44 - 1.00 mg/dL   Calcium 9.1 8.9 - 81.110.3 mg/dL   Total Protein 7.4 6.5 - 8.1 g/dL   Albumin 3.4 (L) 3.5 - 5.0 g/dL   AST 20 15 - 41 U/L   ALT 16 14 - 54 U/L   Alkaline Phosphatase 46 38 -  126 U/L   Total Bilirubin 0.3 0.3 - 1.2 mg/dL   GFR calc non Af Amer 60 (L) >60 mL/min   GFR calc Af Amer >60 >60 mL/min   Anion gap 6 5 - 15  Troponin I  Result Value Ref Range   Troponin I <0.03 <0.031 ng/mL  Brain natriuretic peptide  Result Value Ref Range   B Natriuretic Peptide 600.0 (H) 0.0 - 100.0 pg/mL  CBC with Differential  Result Value Ref Range   WBC 7.5 3.6 - 11.0 K/uL   RBC 4.66 3.80 - 5.20 MIL/uL   Hemoglobin 13.3 12.0 - 16.0 g/dL   HCT 32.9 92.4 - 26.8 %   MCV 86.6 80.0 - 100.0 fL   MCH 28.6 26.0 - 34.0 pg   MCHC 33.0 32.0 - 36.0 g/dL   RDW 34.1 96.2 - 22.9 %   Platelets 195 150 - 440 K/uL   Neutrophils Relative % 54 %   Neutro  Abs 4.1 1.4 - 6.5 K/uL   Lymphocytes Relative 30 %   Lymphs Abs 2.2 1.0 - 3.6 K/uL   Monocytes Relative 11 %   Monocytes Absolute 0.8 0.2 - 0.9 K/uL   Eosinophils Relative 4 %   Eosinophils Absolute 0.3 0 - 0.7 K/uL   Basophils Relative 1 %   Basophils Absolute 0.1 0 - 0.1 K/uL  Troponin I  Result Value Ref Range   Troponin I <0.03 <0.031 ng/mL      Assessment & Plan:   Problem List Items Addressed This Visit      Cardiovascular and Mediastinum   Hypertension    The current medical regimen is effective;  continue present plan and medications.       Relevant Medications   benazepril (LOTENSIN) 5 MG tablet     Other   Hyperlipidemia    The current medical regimen is effective;  continue present plan and medications.       Relevant Medications   benazepril (LOTENSIN) 5 MG tablet    Other Visit Diagnoses    Immunization due    -  Primary    Relevant Orders    Pneumococcal conjugate vaccine 13-valent IM (Completed)    PE (physical exam), annual            Follow up plan: Return in about 6 months (around 08/15/2016) for BMP.

## 2016-02-14 NOTE — Addendum Note (Signed)
Addended byVonita Moss: Andron Marrazzo on: 02/14/2016 04:53 PM   Modules accepted: Orders, SmartSet

## 2016-02-14 NOTE — Addendum Note (Signed)
Addended byVonita Moss: Demeisha Geraghty on: 02/14/2016 01:37 PM   Modules accepted: Orders, SmartSet

## 2016-02-15 LAB — CBC WITH DIFFERENTIAL/PLATELET
Basophils Absolute: 0.1 10*3/uL (ref 0.0–0.2)
Basos: 1 %
EOS (ABSOLUTE): 0.2 10*3/uL (ref 0.0–0.4)
Eos: 3 %
Hematocrit: 38.9 % (ref 34.0–46.6)
Hemoglobin: 13.1 g/dL (ref 11.1–15.9)
Immature Grans (Abs): 0 10*3/uL (ref 0.0–0.1)
Immature Granulocytes: 0 %
Lymphocytes Absolute: 2.1 10*3/uL (ref 0.7–3.1)
Lymphs: 31 %
MCH: 28.8 pg (ref 26.6–33.0)
MCHC: 33.7 g/dL (ref 31.5–35.7)
MCV: 86 fL (ref 79–97)
Monocytes Absolute: 0.6 10*3/uL (ref 0.1–0.9)
Monocytes: 9 %
Neutrophils Absolute: 3.9 10*3/uL (ref 1.4–7.0)
Neutrophils: 56 %
Platelets: 260 10*3/uL (ref 150–379)
RBC: 4.55 x10E6/uL (ref 3.77–5.28)
RDW: 14.2 % (ref 12.3–15.4)
WBC: 7 10*3/uL (ref 3.4–10.8)

## 2016-02-15 LAB — LIPID PANEL
Chol/HDL Ratio: 5.3 ratio units — ABNORMAL HIGH (ref 0.0–4.4)
Cholesterol, Total: 275 mg/dL — ABNORMAL HIGH (ref 100–199)
HDL: 52 mg/dL (ref >39)
LDL Calculated: 162 mg/dL — ABNORMAL HIGH (ref 0–99)
Triglycerides: 307 mg/dL — ABNORMAL HIGH (ref 0–149)
VLDL Cholesterol Cal: 61 mg/dL — ABNORMAL HIGH (ref 5–40)

## 2016-02-15 LAB — TSH: TSH: 1.88 u[IU]/mL (ref 0.450–4.500)

## 2016-02-15 LAB — COMPREHENSIVE METABOLIC PANEL
ALT: 10 IU/L (ref 0–32)
AST: 18 IU/L (ref 0–40)
Albumin/Globulin Ratio: 1.1 — ABNORMAL LOW (ref 1.2–2.2)
Albumin: 3.9 g/dL (ref 3.5–4.7)
Alkaline Phosphatase: 53 IU/L (ref 39–117)
BUN/Creatinine Ratio: 25 (ref 12–28)
BUN: 17 mg/dL (ref 8–27)
Bilirubin Total: <0.2 mg/dL (ref 0.0–1.2)
CO2: 23 mmol/L (ref 18–29)
Calcium: 9.6 mg/dL (ref 8.7–10.3)
Chloride: 99 mmol/L (ref 96–106)
Creatinine, Ser: 0.68 mg/dL (ref 0.57–1.00)
GFR calc Af Amer: 90 mL/min/{1.73_m2} (ref >59)
GFR calc non Af Amer: 78 mL/min/{1.73_m2} (ref >59)
Globulin, Total: 3.5 g/dL (ref 1.5–4.5)
Glucose: 94 mg/dL (ref 65–99)
Potassium: 4.4 mmol/L (ref 3.5–5.2)
Sodium: 139 mmol/L (ref 134–144)
Total Protein: 7.4 g/dL (ref 6.0–8.5)

## 2016-02-16 LAB — URINE CULTURE: Organism ID, Bacteria: NO GROWTH

## 2016-02-20 ENCOUNTER — Encounter: Payer: Self-pay | Admitting: Family Medicine

## 2016-05-23 ENCOUNTER — Encounter: Payer: Self-pay | Admitting: Family Medicine

## 2016-05-23 ENCOUNTER — Ambulatory Visit (INDEPENDENT_AMBULATORY_CARE_PROVIDER_SITE_OTHER): Payer: Commercial Managed Care - HMO | Admitting: Family Medicine

## 2016-05-23 VITALS — BP 155/78 | HR 69 | Temp 98.4°F | Wt 119.0 lb

## 2016-05-23 DIAGNOSIS — I10 Essential (primary) hypertension: Secondary | ICD-10-CM

## 2016-05-23 DIAGNOSIS — G8929 Other chronic pain: Secondary | ICD-10-CM

## 2016-05-23 DIAGNOSIS — M549 Dorsalgia, unspecified: Secondary | ICD-10-CM

## 2016-05-23 NOTE — Patient Instructions (Signed)
Follow up as needed

## 2016-05-23 NOTE — Progress Notes (Deleted)
BP 155/78 mmHg  Pulse 69  Temp(Src) 98.4 F (36.9 C)  Wt 119 lb (53.978 kg)  SpO2 99%  LMP  (LMP Unknown)   Subjective:    Patient ID: Rhonda Ingram, female    DOB: 08/02/1926, 82 y.o.   MRN: [id number]  HPI: Rhonda Ingram is a 82 y.o. female  Chief Complaint  Patient presents with  . Hypertension    she has been checking her BP at home and it's been high.She thinks top number was in the 140s, once she settled down, it came down. Been alot of stress lately.  Patient is here with her daughter, ok to discuss medical issues with her in the room.  Patient is concerned about her BPs lately, states sometimes the number is going up into the 150s when she checks at home. Typically is better upon recheck after sitting a while. Denies any CP, dizziness, or headaches. Is under a good deal of stress right now which is making her feel very anxious. Compliant with medications, no side effects noted.   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 Has tried OTC pain relievers with no benefit. Leads a very active lifestyle and feels the pain is beginning to impede her daily tasks.   Relevant past medical, surgical, family and social history reviewed and updated as indicated. Interim medical history since our last visit reviewed. Allergies and medications reviewed and updated.  Review of Systems  Constitutional: Negative.   Eyes: Negative.   Respiratory: Negative.   Cardiovascular: Negative.   Gastrointestinal: Negative.   Genitourinary: Negative.   Musculoskeletal: Positive for back pain.  Neurological: Negative.   Psychiatric/Behavioral: The patient is nervous/anxious.     Per HPI unless specifically indicated above     Objective:    BP 155/78 mmHg  Pulse 69  Temp(Src) 98.4 F (36.9 C)  Wt 119 lb (53.978 kg)  SpO2 99%  LMP  (LMP Unknown)  Wt Readings from Last 3 Encounters:  05/23/16 119 lb (53.978 kg)  02/14/16  119 lb (53.978 kg)  11/16/15 120 lb (54.432 kg)    Physical Exam  Constitutional: She is oriented to person, place, and time. She appears well-developed and well-nourished. No distress.  HENT:  Head: Atraumatic.  Eyes: Conjunctivae are normal. Pupils are equal, round, and reactive to light. No scleral icterus.  Neck: Normal range of motion. Neck supple.  Cardiovascular: Normal rate, regular rhythm and normal heart sounds.   Pulmonary/Chest: Effort normal and breath sounds normal. No respiratory distress.  Musculoskeletal: Normal range of motion.  Neurological: She is alert and oriented to person, place, and time.  Skin: Skin is warm and dry.  Psychiatric: She has a normal mood and affect. Her behavior is normal.  Nursing note and vitals reviewed.       Assessment & Plan:   Problem List Items Addressed This Visit      Cardiovascular and Mediastinum   Hypertension - Primary    Other Visit Diagnoses    Chronic back pain        Discussed referral to pain management as she has failed OTC medications, patient will consider this option and discuss with Dr. Dossie Arbourrissman at her upcoming appt.      HTN - Likely slightly elevated due to some increased stress lately, she will continue to monitor closely at home. Continue 5 mg benazepril.  Has f/u soon with Dr. Dossie Arbourrissman.  Follow up plan: Return if  symptoms worsen or fail to improve.

## 2016-05-23 NOTE — Progress Notes (Deleted)
BP 155/78 mmHg  Pulse 69  Temp(Src) 98.4 F (36.9 C)  Wt 119 lb (53.978 kg)  SpO2 99%  LMP  (LMP Unknown)   Subjective:    Patient ID: Rhonda Ingram, female    DOB: 09/11/1926, 82 y.o.   MRN: [id number]  HPI: Rhonda Ingram is a 82 y.o. female  Chief Complaint  Patient presents with  . Hypertension    she has been checking her BP at home and it's been high.She thinks top number was in the 140s, once she settled down, it came down. Been alot of stress lately.  Patient is here with her daughter, ok to discuss medical issues with her in the room.  Patient is concerned about her BPs lately, states sometimes the number is going as high 150s systolic when checking at home. Admits to excess stress recently and feeling anxious. Denies CP, dizziness, or blurry vision.   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 Has tried tylenol and other over the counter remedies with no relief. Leads a very active lifestyle and feels the pain is starting to slow her down.   Relevant past medical, surgical, family and social history reviewed and updated as indicated. Interim medical history since our last visit reviewed. Allergies and medications reviewed and updated.  Review of Systems  Constitutional: Negative.   Respiratory: Negative.   Cardiovascular: Negative.  Negative for chest pain and palpitations.  Gastrointestinal: Negative.   Genitourinary: Negative.   Musculoskeletal: Positive for back pain.  Neurological: Negative for dizziness, syncope and headaches.  Psychiatric/Behavioral: Negative for dysphoric mood. The patient is nervous/anxious.     Per HPI unless specifically indicated above     Objective:    BP 155/78 mmHg  Pulse 69  Temp(Src) 98.4 F (36.9 C)  Wt 119 lb (53.978 kg)  SpO2 99%  LMP  (LMP Unknown)  Wt Readings from Last 3 Encounters:  05/23/16 119 lb (53.978 kg)  02/14/16 119 lb (53.978 kg)  11/16/15  120 lb (54.432 kg)    Physical Exam  Constitutional: She is oriented to person, place, and time. She appears well-developed and well-nourished. No distress.  HENT:  Head: Atraumatic.  Eyes: Conjunctivae are normal. No scleral icterus.  Neck: Normal range of motion. Neck supple.  Cardiovascular: Normal rate and normal heart sounds.   Pulmonary/Chest: Effort normal. No respiratory distress.  Musculoskeletal: Normal range of motion.  Neurological: She is alert and oriented to person, place, and time.  Skin: Skin is warm and dry.  Psychiatric: She has a normal mood and affect. Her behavior is normal.  Nursing note and vitals reviewed.   Results for orders placed or performed in visit on 02/14/16  Microscopic Examination  Result Value Ref Range   WBC, UA 11-30 (A) 0 -  5 /hpf   RBC, UA 3-10 (A) 0 -  2 /hpf   Epithelial Cells (non renal) >10 (A) 0 - 10 /hpf   Bacteria, UA Moderate (A) None seen/Few  Urine culture  Result Value Ref Range   Urine Culture, Routine Final report    Urine Culture result 1 No growth   Comprehensive metabolic panel  Result Value Ref Range   Glucose 94 65 - 99 mg/dL   BUN 17 8 - 27 mg/dL   Creatinine, Ser 0.68 0.57 - 1.00 mg/dL   GFR calc non Af Amer 78 >59 mL/min/1.73   GFR calc Af Amer 90 >59 mL/min/1.73     BUN/Creatinine Ratio 25 12 - 28   Sodium 139 134 - 144 mmol/L   Potassium 4.4 3.5 - 5.2 mmol/L   Chloride 99 96 - 106 mmol/L   CO2 23 18 - 29 mmol/L   Calcium 9.6 8.7 - 10.3 mg/dL   Total Protein 7.4 6.0 - 8.5 g/dL   Albumin 3.9 3.5 - 4.7 g/dL   Globulin, Total 3.5 1.5 - 4.5 g/dL   Albumin/Globulin Ratio 1.1 (L) 1.2 - 2.2   Bilirubin Total <0.2 0.0 - 1.2 mg/dL   Alkaline Phosphatase 53 39 - 117 IU/L   AST 18 0 - 40 IU/L   ALT 10 0 - 32 IU/L  Lipid panel  Result Value Ref Range   Cholesterol, Total 275 (H) 100 - 199 mg/dL   Triglycerides 307 (H) 0 - 149 mg/dL   HDL 52 >39 mg/dL   VLDL Cholesterol Cal 61 (H) 5 - 40 mg/dL   LDL Calculated 162  (H) 0 - 99 mg/dL   Chol/HDL Ratio 5.3 (H) 0.0 - 4.4 ratio units  CBC with Differential/Platelet  Result Value Ref Range   WBC 7.0 3.4 - 10.8 x10E3/uL   RBC 4.55 3.77 - 5.28 x10E6/uL   Hemoglobin 13.1 11.1 - 15.9 g/dL   Hematocrit 38.9 34.0 - 46.6 %   MCV 86 79 - 97 fL   MCH 28.8 26.6 - 33.0 pg   MCHC 33.7 31.5 - 35.7 g/dL   RDW 14.2 12.3 - 15.4 %   Platelets 260 150 - 379 x10E3/uL   Neutrophils 56 %   Lymphs 31 %   Monocytes 9 %   Eos 3 %   Basos 1 %   Neutrophils Absolute 3.9 1.4 - 7.0 x10E3/uL   Lymphocytes Absolute 2.1 0.7 - 3.1 x10E3/uL   Monocytes Absolute 0.6 0.1 - 0.9 x10E3/uL   EOS (ABSOLUTE) 0.2 0.0 - 0.4 x10E3/uL   Basophils Absolute 0.1 0.0 - 0.2 x10E3/uL   Immature Granulocytes 0 %   Immature Grans (Abs) 0.0 0.0 - 0.1 x10E3/uL  TSH  Result Value Ref Range   TSH 1.880 0.450 - 4.500 uIU/mL  Urinalysis, Routine w reflex microscopic (not at [facility])  Result Value Ref Range   Specific Gravity, UA 1.015 1.005 - 1.030   pH, UA 6.0 5.0 - 7.5   Color, UA Yellow Yellow   Appearance Ur Cloudy (A) Clear   Leukocytes, UA 2+ (A) Negative   Protein, UA Negative Negative/Trace   Glucose, UA Negative Negative   Ketones, UA Negative Negative   RBC, UA 2+ (A) Negative   Bilirubin, UA Negative Negative   Urobilinogen, Ur 0.2 0.2 - 1.0 mg/dL   Nitrite, UA Negative Negative   Microscopic Examination See below:       Assessment & Plan:   Problem List Items Addressed This Visit      Cardiovascular and Mediastinum   Hypertension - Primary    Other Visit Diagnoses    Chronic back pain        Discussed referral to pain management as she has failed OTC medications, patient will consider this option and discuss with Dr. Crissman at her upcoming appt.      BPs likely slightly elevated due to increased stress, she will continue to monitor closely at home. Continue 5 mg benazepril. Has f/u soon with Dr. Crissman.    Follow up plan: No Follow-up on file.  

## 2016-05-24 NOTE — Progress Notes (Signed)
BP 155/78 mmHg  Pulse 69  Temp(Src) 98.4 F (36.9 C)  Wt 119 lb (53.978 kg)  SpO2 99%  LMP  (LMP Unknown)   Subjective:    Patient ID: Rhonda Ingram, female    DOB: 03/29/1926, 82 y.o.   MRN: [id number]  HPI: Rhonda Ingram is a 82 y.o. female  Chief Complaint  Patient presents with  . Hypertension    she has been checking her BP at home and it's been high.She thinks top number was in the 140s, once she settled down, it came down. Been alot of stress lately.  Patient is here with her daughter, ok to discuss medical issues with her in the room.  Patient is concerned about her BPs lately, states sometimes the number is going as high 150s systolic when checking at home. Admits to excess stress recently and feeling anxious. Denies CP, dizziness, or blurry vision.   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 Has tried tylenol and other over the counter remedies with no relief. Leads a very active lifestyle and feels the pain is starting to slow her down.   Relevant past medical, surgical, family and social history reviewed and updated as indicated. Interim medical history since our last visit reviewed. Allergies and medications reviewed and updated.  Review of Systems  Constitutional: Negative.   Respiratory: Negative.   Cardiovascular: Negative.  Negative for chest pain and palpitations.  Gastrointestinal: Negative.   Genitourinary: Negative.   Musculoskeletal: Positive for back pain.  Neurological: Negative for dizziness, syncope and headaches.  Psychiatric/Behavioral: Negative for dysphoric mood. The patient is nervous/anxious.     Per HPI unless specifically indicated above     Objective:    BP 155/78 mmHg  Pulse 69  Temp(Src) 98.4 F (36.9 C)  Wt 119 lb (53.978 kg)  SpO2 99%  LMP  (LMP Unknown)  Wt Readings from Last 3 Encounters:  05/23/16 119 lb (53.978 kg)  02/14/16 119 lb (53.978 kg)  11/16/15  120 lb (54.432 kg)    Physical Exam  Constitutional: She is oriented to person, place, and time. She appears well-developed and well-nourished. No distress.  HENT:  Head: Atraumatic.  Eyes: Conjunctivae are normal. No scleral icterus.  Neck: Normal range of motion. Neck supple.  Cardiovascular: Normal rate and normal heart sounds.   Pulmonary/Chest: Effort normal. No respiratory distress.  Musculoskeletal: Normal range of motion.  Neurological: She is alert and oriented to person, place, and time.  Skin: Skin is warm and dry.  Psychiatric: She has a normal mood and affect. Her behavior is normal.  Nursing note and vitals reviewed.   Results for orders placed or performed in visit on 02/14/16  Microscopic Examination  Result Value Ref Range   WBC, UA 11-30 (A) 0 -  5 /hpf   RBC, UA 3-10 (A) 0 -  2 /hpf   Epithelial Cells (non renal) >10 (A) 0 - 10 /hpf   Bacteria, UA Moderate (A) None seen/Few  Urine culture  Result Value Ref Range   Urine Culture, Routine Final report    Urine Culture result 1 No growth   Comprehensive metabolic panel  Result Value Ref Range   Glucose 94 65 - 99 mg/dL   BUN 17 8 - 27 mg/dL   Creatinine, Ser 4.09 0.57 - 1.00 mg/dL   GFR calc non Af Amer 78 >59 mL/min/1.73   GFR calc Af Amer 90 >59 mL/min/1.73  BUN/Creatinine Ratio 25 12 - 28   Sodium 139 134 - 144 mmol/L   Potassium 4.4 3.5 - 5.2 mmol/L   Chloride 99 96 - 106 mmol/L   CO2 23 18 - 29 mmol/L   Calcium 9.6 8.7 - 10.3 mg/dL   Total Protein 7.4 6.0 - 8.5 g/dL   Albumin 3.9 3.5 - 4.7 g/dL   Globulin, Total 3.5 1.5 - 4.5 g/dL   Albumin/Globulin Ratio 1.1 (L) 1.2 - 2.2   Bilirubin Total <0.2 0.0 - 1.2 mg/dL   Alkaline Phosphatase 53 39 - 117 IU/L   AST 18 0 - 40 IU/L   ALT 10 0 - 32 IU/L  Lipid panel  Result Value Ref Range   Cholesterol, Total 275 (H) 100 - 199 mg/dL   Triglycerides 409307 (H) 0 - 149 mg/dL   HDL 52 >81>39 mg/dL   VLDL Cholesterol Cal 61 (H) 5 - 40 mg/dL   LDL Calculated 191162  (H) 0 - 99 mg/dL   Chol/HDL Ratio 5.3 (H) 0.0 - 4.4 ratio units  CBC with Differential/Platelet  Result Value Ref Range   WBC 7.0 3.4 - 10.8 x10E3/uL   RBC 4.55 3.77 - 5.28 x10E6/uL   Hemoglobin 13.1 11.1 - 15.9 g/dL   Hematocrit 47.838.9 29.534.0 - 46.6 %   MCV 86 79 - 97 fL   MCH 28.8 26.6 - 33.0 pg   MCHC 33.7 31.5 - 35.7 g/dL   RDW 62.114.2 30.812.3 - 65.715.4 %   Platelets 260 150 - 379 x10E3/uL   Neutrophils 56 %   Lymphs 31 %   Monocytes 9 %   Eos 3 %   Basos 1 %   Neutrophils Absolute 3.9 1.4 - 7.0 x10E3/uL   Lymphocytes Absolute 2.1 0.7 - 3.1 x10E3/uL   Monocytes Absolute 0.6 0.1 - 0.9 x10E3/uL   EOS (ABSOLUTE) 0.2 0.0 - 0.4 x10E3/uL   Basophils Absolute 0.1 0.0 - 0.2 x10E3/uL   Immature Granulocytes 0 %   Immature Grans (Abs) 0.0 0.0 - 0.1 x10E3/uL  TSH  Result Value Ref Range   TSH 1.880 0.450 - 4.500 uIU/mL  Urinalysis, Routine w reflex microscopic (not at [facility])  Result Value Ref Range   Specific Gravity, UA 1.015 1.005 - 1.030   pH, UA 6.0 5.0 - 7.5   Color, UA Yellow Yellow   Appearance Ur Cloudy (A) Clear   Leukocytes, UA 2+ (A) Negative   Protein, UA Negative Negative/Trace   Glucose, UA Negative Negative   Ketones, UA Negative Negative   RBC, UA 2+ (A) Negative   Bilirubin, UA Negative Negative   Urobilinogen, Ur 0.2 0.2 - 1.0 mg/dL   Nitrite, UA Negative Negative   Microscopic Examination See below:       Assessment & Plan:   Problem List Items Addressed This Visit      Cardiovascular and Mediastinum   Hypertension - Primary    Other Visit Diagnoses    Chronic back pain        Discussed referral to pain management as she has failed OTC medications, patient will consider this option and discuss with Dr. Dossie Arbourrissman at her upcoming appt.      BPs likely slightly elevated due to increased stress, she will continue to monitor closely at home. Continue 5 mg benazepril. Has f/u soon with Dr. Dossie Arbourrissman.    Follow up plan: No Follow-up on file.

## 2016-06-27 ENCOUNTER — Encounter: Payer: Self-pay | Admitting: *Deleted

## 2016-06-27 ENCOUNTER — Emergency Department
Admission: EM | Admit: 2016-06-27 | Discharge: 2016-06-28 | Disposition: A | Discharge status: Home / Self Care | Payer: Commercial Managed Care - HMO | Attending: Emergency Medicine | Admitting: Emergency Medicine

## 2016-06-27 DIAGNOSIS — Z79899 Other long term (current) drug therapy: Secondary | ICD-10-CM | POA: Insufficient documentation

## 2016-06-27 DIAGNOSIS — R21 Rash and other nonspecific skin eruption: Secondary | ICD-10-CM | POA: Diagnosis present

## 2016-06-27 DIAGNOSIS — I10 Essential (primary) hypertension: Secondary | ICD-10-CM | POA: Insufficient documentation

## 2016-06-27 DIAGNOSIS — R69 Illness, unspecified: Secondary | ICD-10-CM | POA: Diagnosis not present

## 2016-06-27 DIAGNOSIS — L509 Urticaria, unspecified: Secondary | ICD-10-CM | POA: Diagnosis not present

## 2016-06-27 MED ORDER — FAMOTIDINE IN NACL 20-0.9 MG/50ML-% IV SOLN
INTRAVENOUS | Status: AC
  Administered 2016-06-28: 20 mg via INTRAVENOUS
  Filled 2016-06-27: qty 50

## 2016-06-27 MED ORDER — METHYLPREDNISOLONE SODIUM SUCC 125 MG IJ SOLR
INTRAMUSCULAR | Status: AC
  Administered 2016-06-28: 125 mg via INTRAVENOUS
  Filled 2016-06-27: qty 2

## 2016-06-27 MED ORDER — DIPHENHYDRAMINE HCL 50 MG/ML IJ SOLN
INTRAMUSCULAR | Status: AC
  Administered 2016-06-28: 25 mg via INTRAVENOUS
  Filled 2016-06-27: qty 1

## 2016-06-27 NOTE — ED Provider Notes (Signed)
[facility] Emergency Department Provider Note  ____________________________________________   First MD Initiated Contact with Patient 06/27/16 2354     (approximate)  I have reviewed the triage vital signs and the nursing notes.   HISTORY  Chief Complaint Shortness of Breath   HPI Rhonda Ingram is a 82 y.o. female presents with acute onset of generalized pruritus associated with dyspnea approximately 30 minutes before presentation to the emergency department. Patient states she's had 2 similar episodes this year without clear etiology. Patient denies any fever. Patient states that she has been bitten by ticks multiple times this summer.   Past Medical History:  Diagnosis Date  . Arthritis    osteoarthritis  . Hyperlipidemia   . Hypertension   . Osteoporosis   . Vitamin D deficiency     Patient Active Problem List   Diagnosis Date Noted  . Allergic reaction 11/16/2015  . Hypertension 08/02/2015  . Hyperlipidemia 08/02/2015    Past Surgical History:  Procedure Laterality Date  . CHOLECYSTECTOMY    . EYE SURGERY     cataract extraction    Prior to Admission medications   Medication Sig Start Date End Date Taking? Authorizing Provider  benazepril (LOTENSIN) 5 MG tablet Take 1 tablet (5 mg total) by mouth daily. 02/14/16   Steele SizerMark A Crissman, MD    Allergies Review of patient's allergies indicates not on file.  Family History  Problem Relation Age of Onset  . Hypertension Mother   . Stroke Mother   . Cancer Father     lung  . Hypertension Sister   . Hyperlipidemia Sister   . Hypertension Daughter   . Cerebral palsy Daughter   . Hypertension Maternal Grandmother   . Hypertension Maternal Grandfather   . Hypertension Paternal Grandmother   . Hypertension Paternal Grandfather   . Hypertension Sister   . Hyperlipidemia Sister   . Migraines Daughter   . Diabetes Brother   . Hypertension Brother   . Hyperlipidemia Brother      Social History Social History  Substance Use Topics  . Smoking status: Never Smoker  . Smokeless tobacco: Never Used  . Alcohol use No    Review of Systems Constitutional: No fever/chills Eyes: No visual changes. ENT: No sore throat. Cardiovascular: Denies chest pain. Respiratory: Denies shortness of breath. Gastrointestinal: No abdominal pain.  No nausea, no vomiting.  No diarrhea.  No constipation. Genitourinary: Negative for dysuria. Musculoskeletal: Negative for back pain. Skin: Positive for rash. Neurological: Negative for headaches, focal weakness or numbness.  10-point ROS otherwise negative.  ____________________________________________   PHYSICAL EXAM:  VITAL SIGNS: ED Triage Vitals  Enc Vitals Group     BP 06/27/16 2342 (!) 192/74     Pulse Rate 06/27/16 2342 88     Resp 06/27/16 2342 20     Temp 06/27/16 2342 98.2 F (36.8 C)     Temp Source 06/27/16 2342 Oral     SpO2 06/27/16 2342 96 %     Weight 06/27/16 2344 120 lb (54.4 kg)     Height 06/27/16 2344 5' (1.524 m)     Head Circumference --      Peak Flow --      Pain Score --      Pain Loc --      Pain Edu? --      Excl. in GC? --     Constitutional: Alert and oriented. Well appearing and in no acute distress. Eyes: Conjunctivae are normal. PERRL.  EOMI. Head: Atraumatic. Ears:  Healthy appearing ear canals and TMs bilaterally Nose: No congestion/rhinnorhea. Mouth/Throat: Mucous membranes are moist.  Oropharynx non-erythematous. Neck: No stridor.  No meningeal signs.  Cardiovascular: Normal rate, regular rhythm. Good peripheral circulation. Grossly normal heart sounds.   Respiratory: Normal respiratory effort.  No retractions. Lungs CTAB. Gastrointestinal: Soft and nontender. No distention.  Musculoskeletal: No lower extremity tenderness nor edema. No gross deformities of extremities. Neurologic:  Normal speech and language. No gross focal neurologic deficits are appreciated.  Skin:  Generalized urticarial rash trunk and bilateral upper extremities. Psychiatric: Mood and affect are normal. Speech and behavior are normal.  ____________________________________________   LABS (all labs ordered are listed, but only abnormal results are displayed)  Labs Reviewed - No data to display ____________________________________________   RADIOLOGY I, [place] Dewayne ShorterN Jacci Ruberg, personally viewed and evaluated these images (plain radiographs) as part of my medical decision making, as well as reviewing the written report by the radiologist.  No results found.  __________________ Procedures   ______________   INITIAL IMPRESSION / ASSESSMENT AND PLAN / ED COURSE  Pertinent labs & imaging results that were available during my care of the patient were reviewed by me and considered in my medical decision making (see chart for details).  Patient received Benadryl and Solu-Medrol and Pepcid emergency department with complete resolution of pruritus and hives. Patient will be referred to Dr. Adron Beneingle allergist to further outpatient evaluation. Considered possibility patient rash secondary to intolerance of red meat due to tick bite.  Clinical Course    ____________________________________________  FINAL CLINICAL IMPRESSION(S) / ED DIAGNOSES  Final diagnoses:  Urticaria     MEDICATIONS GIVEN DURING THIS VISIT:  Medications  methylPREDNISolone sodium succinate (SOLU-MEDROL) 125 mg/2 mL injection (not administered)  diphenhydrAMINE (BENADRYL) 50 MG/ML injection (not administered)  famotidine (PEPCID) 20-0.9 MG/50ML-% IVPB (not administered)     NEW OUTPATIENT MEDICATIONS STARTED DURING THIS VISIT:  New Prescriptions   No medications on file      Note:  This document was prepared using Dragon voice recognition software and may include unintentional dictation errors.    Darci Currentandolph N Siya Flurry, MD 06/28/16 [phone]

## 2016-06-27 NOTE — ED Triage Notes (Signed)
Pt to triage via wheelchair.  Pt reports sob since 2200 tonight.  Denies chest pain.  Pt has a nonproductive cough.  Nonsmoker.  Pt also reports itching itching in private area and both arms took a benadryl at 2245 tonight.  States itching has improved.   Pt alert and speech is clear.

## 2016-06-28 MED ORDER — FAMOTIDINE IN NACL 20-0.9 MG/50ML-% IV SOLN
20.0000 mg | Freq: Once | INTRAVENOUS | Status: AC
  Administered 2016-06-28: 20 mg via INTRAVENOUS

## 2016-06-28 MED ORDER — DIPHENHYDRAMINE HCL 50 MG/ML IJ SOLN
25.0000 mg | Freq: Once | INTRAMUSCULAR | Status: AC
  Administered 2016-06-28: 25 mg via INTRAVENOUS

## 2016-06-28 MED ORDER — METHYLPREDNISOLONE SODIUM SUCC 125 MG IJ SOLR
125.0000 mg | Freq: Once | INTRAMUSCULAR | Status: AC
  Administered 2016-06-28: 125 mg via INTRAVENOUS

## 2016-06-28 MED ORDER — PREDNISONE 20 MG PO TABS: 60.0000 mg | ORAL_TABLET | Freq: Every day | ORAL | 0 refills | Status: AC

## 2016-06-28 NOTE — ED Notes (Signed)
Pt. States she was experiencing SOB, wheezing, coughing. Upon arrival, pt. Minimally SOB, reduced dry coughing present than PTA, no wheezing present upon arrival to tx. Room.

## 2016-06-28 NOTE — ED Notes (Signed)
Pt. Verbalizes understanding of d/c instructions, prescriptions, and follow-up. VS stable and pain controlled per pt.  Pt. In NAD at time of d/c and denies further concerns regarding this visit. Pt. Ambulatory Out of the unit with steady gait in good spirits. Pt advised to return to the ED at any time for emergent concerns, or for new/worsening symptoms.

## 2016-06-28 NOTE — ED Notes (Signed)
Pt. Reports that itching has decreased and areas affected are less red than arrival to tx. room

## 2016-06-29 ENCOUNTER — Ambulatory Visit (INDEPENDENT_AMBULATORY_CARE_PROVIDER_SITE_OTHER): Payer: Commercial Managed Care - HMO | Admitting: Family Medicine

## 2016-06-29 ENCOUNTER — Encounter: Payer: Self-pay | Admitting: Family Medicine

## 2016-06-29 VITALS — BP 130/72 | HR 70 | Temp 98.3°F | Wt 121.0 lb

## 2016-06-29 DIAGNOSIS — L509 Urticaria, unspecified: Secondary | ICD-10-CM

## 2016-06-29 MED ORDER — TRIAMCINOLONE ACETONIDE 0.1 % EX CREA: 1.0000 "application " | TOPICAL_CREAM | Freq: Two times a day (BID) | CUTANEOUS | 0 refills | Status: DC

## 2016-06-29 NOTE — Patient Instructions (Signed)
Follow up as needed

## 2016-06-29 NOTE — Progress Notes (Signed)
   BP 130/72   Pulse 70   Temp 98.3 F (36.8 C)   Wt 121 lb (54.9 kg)   LMP  (LMP Unknown)   SpO2 97%   BMI 23.63 kg/m    Subjective:    Patient ID: Rhonda Ingram, female    DOB: 10/31/1926, 82 y.o.   MRN: [id number]  HPI: Rhonda Ingram is a 82 y.o. female  Chief Complaint  Patient presents with  . Allergic Reaction    she has episodes when she starts getting red, itching, whelps, then starts coughing and can't stop. They aren't sure what is causing it. She would like a referral to an allergist.    Patient presents for ER f/u regarding an allergic reaction that she had several days ago. States she got hives all over and felt short of breath with chest tightness. This has happened several times in the past, unsure of the trigger. Has never undergone allergy testing. Was given solu-medrol, benadryl, and pepcid in ER with complete resolution of symptoms. Sent home with prednisone script but never picked it up and has been asymptomatic since. Would like referral for allergy testing.   Relevant past medical, surgical, family and social history reviewed and updated as indicated. Interim medical history since our last visit reviewed. Allergies and medications reviewed and updated.  Review of Systems  Constitutional: Negative.   HENT: Negative.   Eyes: Negative.   Respiratory: Positive for chest tightness and shortness of breath.   Gastrointestinal: Negative.   Musculoskeletal: Negative.   Neurological: Negative.   Psychiatric/Behavioral: Negative.    Per HPI unless specifically indicated above     Objective:    BP 130/72   Pulse 70   Temp 98.3 F (36.8 C)   Wt 121 lb (54.9 kg)   LMP  (LMP Unknown)   SpO2 97%   BMI 23.63 kg/m   Wt Readings from Last 3 Encounters:  06/29/16 121 lb (54.9 kg)  06/27/16 120 lb (54.4 kg)  05/23/16 119 lb (54 kg)    Physical Exam  Constitutional: She is oriented to person, place, and time. She appears well-developed and well-nourished.  No distress.  HENT:  Head: Atraumatic.  Eyes: Conjunctivae are normal. No scleral icterus.  Neck: Normal range of motion. Neck supple.  Cardiovascular: Normal rate and normal heart sounds.   Pulmonary/Chest: Effort normal. No respiratory distress.  Musculoskeletal: Normal range of motion.  Neurological: She is alert and oriented to person, place, and time.  Skin: Skin is warm and dry. No rash noted.  Psychiatric: She has a normal mood and affect. Her behavior is normal.  Nursing note and vitals reviewed.     Assessment & Plan:   Problem List Items Addressed This Visit    None    Visit Diagnoses    Urticaria    -  Primary   Relevant Orders   Ambulatory referral to Allergy     Referral placed for allergy testing. Discussed with patient that since she is completely better, she does not have to take the prednisone right now it can be saved at home until a future episode. Sent in some triamcinolone for some itching insect bite sites as well.   Follow up plan: Return if symptoms worsen or fail to improve.

## 2016-07-02 ENCOUNTER — Telehealth: Payer: Self-pay | Admitting: Family Medicine

## 2016-07-02 NOTE — Telephone Encounter (Signed)
Pt has scheduled an appt for 07/04/2016 to see Willeen Nieceara Stewart at [facility] which [facility].

## 2016-07-02 NOTE — Telephone Encounter (Signed)
Patient needed more [employer] Authorizations.   Received authorization with Dr. Roseanne RenoStewart.   Auth# [id number].

## 2016-07-23 ENCOUNTER — Other Ambulatory Visit: Payer: Self-pay | Admitting: Family Medicine

## 2016-07-23 ENCOUNTER — Telehealth: Payer: Self-pay

## 2016-07-23 MED ORDER — TRIAMCINOLONE ACETONIDE 0.1 % EX CREA: 1.0000 "application " | TOPICAL_CREAM | Freq: Two times a day (BID) | CUTANEOUS | 0 refills | Status: DC

## 2016-07-23 NOTE — Telephone Encounter (Signed)
Patient would like to get a large tube of   Triamcinolone Cream 0.1%  [facility]

## 2016-08-15 ENCOUNTER — Encounter: Payer: Self-pay | Admitting: Family Medicine

## 2016-08-15 ENCOUNTER — Ambulatory Visit (INDEPENDENT_AMBULATORY_CARE_PROVIDER_SITE_OTHER): Payer: Commercial Managed Care - HMO | Admitting: Family Medicine

## 2016-08-15 VITALS — BP 138/82 | HR 98 | Temp 98.3°F | Ht 60.0 in | Wt 120.6 lb

## 2016-08-15 DIAGNOSIS — Z23 Encounter for immunization: Secondary | ICD-10-CM | POA: Diagnosis not present

## 2016-08-15 DIAGNOSIS — T7840XA Allergy, unspecified, initial encounter: Secondary | ICD-10-CM | POA: Diagnosis not present

## 2016-08-15 DIAGNOSIS — I10 Essential (primary) hypertension: Secondary | ICD-10-CM

## 2016-08-15 NOTE — Assessment & Plan Note (Signed)
The current medical regimen is effective;  continue present plan and medications.  

## 2016-08-15 NOTE — Progress Notes (Signed)
BP 138/82 (BP Location: Left Arm)   Pulse 98   Temp 98.3 F (36.8 C)   Ht 5' (1.524 m)   Wt 120 lb 9.6 oz (54.7 kg)   LMP  (LMP Unknown)   SpO2 99%   BMI 23.55 kg/m    Subjective:    Patient ID: Rhonda Ingram, female    DOB: 11/17/1925, 82 y.o.   MRN: [id number]  HPI: Rhonda ProudMary M Zenor is a 82 y.o. female  Chief Complaint  Patient presents with  . Follow-up  . Hypertension  Patient follow-up hypertension blood pressure doing good no complaints takes medications faithfully Has also been having problems with hives shoe to carious some GI upset has been trying to figure out what's going on gets routinely bitten by ticks every summer and his had continued chronic problems with itching at the tick bite sites has stopped beef and symptoms have improved. Patient will continue off the fifth and has follow-up appointment with Dr. Willeen CassBennett. At [facility].  Patient with nocturnal leg cramps discuss massage when on rubber ball and leg care  Relevant past medical, surgical, family and social history reviewed and updated as indicated. Interim medical history since our last visit reviewed. Allergies and medications reviewed and updated.  Review of Systems  Constitutional: Negative.   Respiratory: Negative.   Cardiovascular: Negative.     Per HPI unless specifically indicated above     Objective:    BP 138/82 (BP Location: Left Arm)   Pulse 98   Temp 98.3 F (36.8 C)   Ht 5' (1.524 m)   Wt 120 lb 9.6 oz (54.7 kg)   LMP  (LMP Unknown)   SpO2 99%   BMI 23.55 kg/m   Wt Readings from Last 3 Encounters:  08/15/16 120 lb 9.6 oz (54.7 kg)  06/29/16 121 lb (54.9 kg)  06/27/16 120 lb (54.4 kg)    Physical Exam  Constitutional: She is oriented to person, place, and time. She appears well-developed and well-nourished. No distress.  HENT:  Head: Normocephalic and atraumatic.  Right Ear: Hearing normal.  Left Ear: Hearing normal.  Nose: Nose normal.  Eyes: Conjunctivae and lids  are normal. Right eye exhibits no discharge. Left eye exhibits no discharge. No scleral icterus.  Cardiovascular: Normal rate, regular rhythm and normal heart sounds.   Pulmonary/Chest: Effort normal and breath sounds normal. No respiratory distress.  Musculoskeletal: Normal range of motion.  Neurological: She is alert and oriented to person, place, and time.  Skin: Skin is intact. No rash noted.  Psychiatric: She has a normal mood and affect. Her speech is normal and behavior is normal. Judgment and thought content normal. Cognition and memory are normal.    Results for orders placed or performed in visit on 02/14/16  Microscopic Examination  Result Value Ref Range   WBC, UA 11-30 (A) 0 - 5 /hpf   RBC, UA 3-10 (A) 0 - 2 /hpf   Epithelial Cells (non renal) >10 (A) 0 - 10 /hpf   Bacteria, UA Moderate (A) None seen/Few  Urine culture  Result Value Ref Range   Urine Culture, Routine Final report    Urine Culture result 1 No growth   Comprehensive metabolic panel  Result Value Ref Range   Glucose 94 65 - 99 mg/dL   BUN 17 8 - 27 mg/dL   Creatinine, Ser 4.090.68 0.57 - 1.00 mg/dL   GFR calc non Af Amer 78 >59 mL/min/1.73   GFR calc Af Amer 90 >  59 mL/min/1.73   BUN/Creatinine Ratio 25 12 - 28   Sodium 139 134 - 144 mmol/L   Potassium 4.4 3.5 - 5.2 mmol/L   Chloride 99 96 - 106 mmol/L   CO2 23 18 - 29 mmol/L   Calcium 9.6 8.7 - 10.3 mg/dL   Total Protein 7.4 6.0 - 8.5 g/dL   Albumin 3.9 3.5 - 4.7 g/dL   Globulin, Total 3.5 1.5 - 4.5 g/dL   Albumin/Globulin Ratio 1.1 (L) 1.2 - 2.2   Bilirubin Total <0.2 0.0 - 1.2 mg/dL   Alkaline Phosphatase 53 39 - 117 IU/L   AST 18 0 - 40 IU/L   ALT 10 0 - 32 IU/L  Lipid panel  Result Value Ref Range   Cholesterol, Total 275 (H) 100 - 199 mg/dL   Triglycerides 161 (H) 0 - 149 mg/dL   HDL 52 >09 mg/dL   VLDL Cholesterol Cal 61 (H) 5 - 40 mg/dL   LDL Calculated 604 (H) 0 - 99 mg/dL   Chol/HDL Ratio 5.3 (H) 0.0 - 4.4 ratio units  CBC with  Differential/Platelet  Result Value Ref Range   WBC 7.0 3.4 - 10.8 x10E3/uL   RBC 4.55 3.77 - 5.28 x10E6/uL   Hemoglobin 13.1 11.1 - 15.9 g/dL   Hematocrit 54.0 98.1 - 46.6 %   MCV 86 79 - 97 fL   MCH 28.8 26.6 - 33.0 pg   MCHC 33.7 31.5 - 35.7 g/dL   RDW 19.1 47.8 - 29.5 %   Platelets 260 150 - 379 x10E3/uL   Neutrophils 56 %   Lymphs 31 %   Monocytes 9 %   Eos 3 %   Basos 1 %   Neutrophils Absolute 3.9 1.4 - 7.0 x10E3/uL   Lymphocytes Absolute 2.1 0.7 - 3.1 x10E3/uL   Monocytes Absolute 0.6 0.1 - 0.9 x10E3/uL   EOS (ABSOLUTE) 0.2 0.0 - 0.4 x10E3/uL   Basophils Absolute 0.1 0.0 - 0.2 x10E3/uL   Immature Granulocytes 0 %   Immature Grans (Abs) 0.0 0.0 - 0.1 x10E3/uL  TSH  Result Value Ref Range   TSH 1.880 0.450 - 4.500 uIU/mL  Urinalysis, Routine w reflex microscopic (not at [facility])  Result Value Ref Range   Specific Gravity, UA 1.015 1.005 - 1.030   pH, UA 6.0 5.0 - 7.5   Color, UA Yellow Yellow   Appearance Ur Cloudy (A) Clear   Leukocytes, UA 2+ (A) Negative   Protein, UA Negative Negative/Trace   Glucose, UA Negative Negative   Ketones, UA Negative Negative   RBC, UA 2+ (A) Negative   Bilirubin, UA Negative Negative   Urobilinogen, Ur 0.2 0.2 - 1.0 mg/dL   Nitrite, UA Negative Negative   Microscopic Examination See below:       Assessment & Plan:   Problem List Items Addressed This Visit      Cardiovascular and Mediastinum   Hypertension - Primary    The current medical regimen is effective;  continue present plan and medications.       Relevant Orders   Basic metabolic panel     Other   Allergic reaction    Discuss allergic reaction and care avoiding beef       Other Visit Diagnoses    Need for influenza vaccination       Relevant Orders   Flu vaccine HIGH DOSE PF (Fluzone High dose)       Follow up plan: Return in about 6 months (around 02/13/2017) for Physical Exam.

## 2016-08-15 NOTE — Assessment & Plan Note (Signed)
Discuss allergic reaction and care avoiding beef

## 2016-08-16 ENCOUNTER — Encounter: Payer: Self-pay | Admitting: Family Medicine

## 2016-08-16 DIAGNOSIS — L508 Other urticaria: Secondary | ICD-10-CM | POA: Diagnosis not present

## 2016-08-16 DIAGNOSIS — J301 Allergic rhinitis due to pollen: Secondary | ICD-10-CM | POA: Diagnosis not present

## 2016-08-16 LAB — BASIC METABOLIC PANEL
BUN/Creatinine Ratio: 18 (ref 12–28)
BUN: 14 mg/dL (ref 8–27)
CO2: 25 mmol/L (ref 18–29)
Calcium: 9.2 mg/dL (ref 8.7–10.3)
Chloride: 102 mmol/L (ref 96–106)
Creatinine, Ser: 0.77 mg/dL (ref 0.57–1.00)
GFR calc Af Amer: 80 mL/min/{1.73_m2} (ref >59)
GFR calc non Af Amer: 69 mL/min/{1.73_m2} (ref >59)
Glucose: 92 mg/dL (ref 65–99)
Potassium: 4.5 mmol/L (ref 3.5–5.2)
Sodium: 141 mmol/L (ref 134–144)

## 2016-08-17 ENCOUNTER — Other Ambulatory Visit: Payer: Self-pay | Admitting: Family Medicine

## 2016-08-17 DIAGNOSIS — J302 Other seasonal allergic rhinitis: Secondary | ICD-10-CM | POA: Diagnosis not present

## 2016-08-17 NOTE — Telephone Encounter (Signed)
Routing to provider  

## 2016-09-03 ENCOUNTER — Encounter: Payer: Self-pay | Admitting: Family Medicine

## 2016-09-03 ENCOUNTER — Ambulatory Visit (INDEPENDENT_AMBULATORY_CARE_PROVIDER_SITE_OTHER): Payer: Commercial Managed Care - HMO | Admitting: Family Medicine

## 2016-09-03 DIAGNOSIS — K439 Ventral hernia without obstruction or gangrene: Secondary | ICD-10-CM | POA: Diagnosis not present

## 2016-09-03 DIAGNOSIS — I10 Essential (primary) hypertension: Secondary | ICD-10-CM

## 2016-09-03 NOTE — Progress Notes (Signed)
BP 138/82 (BP Location: Left Arm)   Pulse 79   Temp 98.1 F (36.7 C)   Wt 118 lb (53.5 kg)   LMP  (LMP Unknown)   SpO2 95%   BMI 23.05 kg/m    Subjective:    Patient ID: Rhonda Ingram, female    DOB: 06/20/1926, 82 y.o.   MRN: [id number]  HPI: GWENETH FREDLUND is a 82 y.o. female  Chief Complaint  Patient presents with  . Hernia    off and on x 1 year. was tender and had a knot over the weekend.  Ventral hernia just above umbilicus started really hurting over the weekend was so tender can hardly touch it now it's less tender but still very present and wants to have it fixed. Patient's blood pressure first reading elevated on repeat is doing good Relevant past medical, surgical, family and social history reviewed and updated as indicated. Interim medical history since our last visit reviewed. Allergies and medications reviewed and updated.  Review of Systems  Constitutional: Negative.   Respiratory: Negative.   Cardiovascular: Negative.     Per HPI unless specifically indicated above     Objective:    BP 138/82 (BP Location: Left Arm)   Pulse 79   Temp 98.1 F (36.7 C)   Wt 118 lb (53.5 kg)   LMP  (LMP Unknown)   SpO2 95%   BMI 23.05 kg/m   Wt Readings from Last 3 Encounters:  09/03/16 118 lb (53.5 kg)  08/15/16 120 lb 9.6 oz (54.7 kg)  06/29/16 121 lb (54.9 kg)    Physical Exam  Constitutional: She is oriented to person, place, and time. She appears well-developed and well-nourished. No distress.  HENT:  Head: Normocephalic and atraumatic.  Right Ear: Hearing normal.  Left Ear: Hearing normal.  Nose: Nose normal.  Eyes: Conjunctivae and lids are normal. Right eye exhibits no discharge. Left eye exhibits no discharge. No scleral icterus.  Cardiovascular: Normal rate, regular rhythm and normal heart sounds.   Pulmonary/Chest: Effort normal and breath sounds normal. No respiratory distress.  Musculoskeletal: Normal range of motion.  Neurological: She is  alert and oriented to person, place, and time.  Skin: Skin is intact. No rash noted.  Psychiatric: She has a normal mood and affect. Her speech is normal and behavior is normal. Judgment and thought content normal. Cognition and memory are normal.    Results for orders placed or performed in visit on 08/15/16  Basic metabolic panel  Result Value Ref Range   Glucose 92 65 - 99 mg/dL   BUN 14 8 - 27 mg/dL   Creatinine, Ser 4.09 0.57 - 1.00 mg/dL   GFR calc non Af Amer 69 >59 mL/min/1.73   GFR calc Af Amer 80 >59 mL/min/1.73   BUN/Creatinine Ratio 18 12 - 28   Sodium 141 134 - 144 mmol/L   Potassium 4.5 3.5 - 5.2 mmol/L   Chloride 102 96 - 106 mmol/L   CO2 25 18 - 29 mmol/L   Calcium 9.2 8.7 - 10.3 mg/dL      Assessment & Plan:   Problem List Items Addressed This Visit      Cardiovascular and Mediastinum   Hypertension    The current medical regimen is effective;  continue present plan and medications.       Relevant Medications   EPIPEN 2-PAK 0.3 MG/0.3ML SOAJ injection     Other   Ventral hernia    Hernia has been present  for over a year but now is gotten tender and painful will refer to surgery for further evaluation      Relevant Orders   Ambulatory referral to General Surgery    Other Visit Diagnoses   None.      Follow up plan: Return for As scheduled.

## 2016-09-03 NOTE — Assessment & Plan Note (Signed)
The current medical regimen is effective;  continue present plan and medications.  

## 2016-09-03 NOTE — Assessment & Plan Note (Signed)
Hernia has been present for over a year but now is gotten tender and painful will refer to surgery for further evaluation

## 2016-09-04 ENCOUNTER — Encounter: Payer: Self-pay | Admitting: *Deleted

## 2016-09-17 ENCOUNTER — Encounter: Payer: Self-pay | Admitting: General Surgery

## 2016-09-17 ENCOUNTER — Ambulatory Visit (INDEPENDENT_AMBULATORY_CARE_PROVIDER_SITE_OTHER): Payer: Medicare HMO | Admitting: General Surgery

## 2016-09-17 VITALS — BP 110/68 | HR 62 | Resp 12 | Ht 60.0 in | Wt 118.0 lb

## 2016-09-17 DIAGNOSIS — K429 Umbilical hernia without obstruction or gangrene: Secondary | ICD-10-CM | POA: Diagnosis not present

## 2016-09-17 NOTE — Patient Instructions (Signed)
Hernia, Adult A hernia is the bulging of an organ or tissue through a weak spot in the muscles of the abdomen (abdominal wall). Hernias develop most often near the navel or groin. There are many kinds of hernias. Common kinds include:  Femoral hernia. This kind of hernia develops under the groin in the upper thigh area.  Inguinal hernia. This kind of hernia develops in the groin or scrotum.  Umbilical hernia. This kind of hernia develops near the navel.  Hiatal hernia. This kind of hernia causes part of the stomach to be pushed up into the chest.  Incisional hernia. This kind of hernia bulges through a scar from an abdominal surgery. CAUSES This condition may be caused by:  Heavy lifting.  Coughing over a long period of time.  Straining to have a bowel movement.  An incision made during an abdominal surgery.  A birth defect (congenital defect).  Excess weight or obesity.  Smoking.  Poor nutrition.  Cystic fibrosis.  Excess fluid in the abdomen.  Undescended testicles. SYMPTOMS Symptoms of a hernia include:  A lump on the abdomen. This is the first sign of a hernia. The lump may become more obvious with standing, straining, or coughing. It may get bigger over time if it is not treated or if the condition causing it is not treated.  Pain. A hernia is usually painless, but it may become painful over time if treatment is delayed. The pain is usually dull and may get worse with standing or lifting heavy objects. Sometimes a hernia gets tightly squeezed in the weak spot (strangulated) or stuck there (incarcerated) and causes additional symptoms. These symptoms may include:  Vomiting.  Nausea.  Constipation.  Irritability. DIAGNOSIS A hernia may be diagnosed with:  A physical exam. During the exam your health care provider may ask you to cough or to make a specific movement, because a hernia is usually more visible when you move.  Imaging tests. These can  include:  X-rays.  Ultrasound.  CT scan. TREATMENT A hernia that is small and painless may not need to be treated. A hernia that is large or painful may be treated with surgery. Inguinal hernias may be treated with surgery to prevent incarceration or strangulation. Strangulated hernias are always treated with surgery, because lack of blood to the trapped organ or tissue can cause it to die. Surgery to treat a hernia involves pushing the bulge back into place and repairing the weak part of the abdomen. HOME CARE INSTRUCTIONS  Avoid straining.  Do not lift anything heavier than 10 lb (4.5 kg).  Lift with your leg muscles, not your back muscles. This helps avoid strain.  When coughing, try to cough gently.  Prevent constipation. Constipation leads to straining with bowel movements, which can make a hernia worse or cause a hernia repair to break down. You can prevent constipation by:  Eating a high-fiber diet that includes plenty of fruits and vegetables.  Drinking enough fluids to keep your urine clear or pale yellow. Aim to drink 6-8 glasses of water per day.  Using a stool softener as directed by your health care provider.  Lose weight, if you are overweight.  Do not use any tobacco products, including cigarettes, chewing tobacco, or electronic cigarettes. If you need help quitting, ask your health care provider.  Keep all follow-up visits as directed by your health care provider. This is important. Your health care provider may need to monitor your condition. SEEK MEDICAL CARE IF:  You have   swelling, redness, and pain in the affected area.  Your bowel habits change. SEEK IMMEDIATE MEDICAL CARE IF:  You have a fever.  You have abdominal pain that is getting worse.  You feel nauseous or you vomit.  You cannot push the hernia back in place by gently pressing on it while you are lying down.  The hernia:  Changes in shape or size.  Is stuck outside the  abdomen.  Becomes discolored.  Feels hard or tender.   This information is not intended to replace advice given to you by your health care provider. Make sure you discuss any questions you have with your health care provider.   Document Released: 10/29/2005 Document Revised: 11/19/2014 Document Reviewed: 09/08/2014 Elsevier Interactive Patient Education 2016 [employer]  

## 2016-09-17 NOTE — Progress Notes (Signed)
Patient ID: Rhonda Ingram, female   DOB: 05/26/1926, 82 y.o.   MRN: [id number]  Chief Complaint  Patient presents with  . Other    umbilical hernia     HPI Rhonda Ingram is a 82 y.o. female here today for an evaluation of a umbilical hernia.  The patient states she first noticed this last year when lifting a box.  She states she has occasional tenderness.  Denies any GI issues.  I have reviewed the history of present illness with the patient.  HPI  Past Medical History:  Diagnosis Date  . Arthritis    osteoarthritis  . Hyperlipidemia   . Hypertension   . Osteoporosis   . Vitamin D deficiency     Past Surgical History:  Procedure Laterality Date  . CHOLECYSTECTOMY    . EYE SURGERY     cataract extraction    Family History  Problem Relation Age of Onset  . Hypertension Mother   . Stroke Mother   . Cancer Father     lung  . Hypertension Sister   . Hyperlipidemia Sister   . Hypertension Daughter   . Cerebral palsy Daughter   . Hypertension Maternal Grandmother   . Hypertension Maternal Grandfather   . Hypertension Paternal Grandmother   . Hypertension Paternal Grandfather   . Hypertension Sister   . Hyperlipidemia Sister   . Migraines Daughter   . Diabetes Brother   . Hypertension Brother   . Hyperlipidemia Brother     Social History Social History  Substance Use Topics  . Smoking status: Never Smoker  . Smokeless tobacco: Never Used  . Alcohol use No    No Known Allergies  Current Outpatient Prescriptions  Medication Sig Dispense Refill  . benazepril (LOTENSIN) 5 MG tablet Take 1 tablet (5 mg total) by mouth daily. 90 tablet 4  . diphenhydrAMINE (BENADRYL) 25 mg capsule Take 25 mg by mouth every 6 (six) hours as needed.    . diphenhydramine-acetaminophen (TYLENOL PM) 25-500 MG TABS tablet Take 1 tablet by mouth at bedtime as needed (sleep).    . EPIPEN 2-PAK 0.3 MG/0.3ML SOAJ injection     . fluticasone (FLONASE) 50 MCG/ACT nasal spray     .  triamcinolone cream (KENALOG) 0.1 % APPLY TOPICALLY TWO TIMES DAILY. 30 g 0   No current facility-administered medications for this visit.     Review of Systems Review of Systems  Constitutional: Negative.   Respiratory: Negative.   Cardiovascular: Negative.     Blood pressure 110/68, pulse 62, resp. rate 12, height 5' (1.524 m), weight 118 lb (53.5 kg).  Physical Exam Physical Exam  Constitutional: She is oriented to person, place, and time. She appears well-developed and well-nourished.  Eyes: Conjunctivae are normal. No scleral icterus.  Neck: Neck supple.  Cardiovascular: Normal rate, regular rhythm and normal heart sounds.   Pulmonary/Chest: Effort normal and breath sounds normal.  Abdominal: Soft. Bowel sounds are normal. A hernia (3-4 cm hernia at and just above umbilicus. Not fully reducible. Mildly tender ) is present.  Lymphadenopathy:    She has no cervical adenopathy.  Neurological: She is alert and oriented to person, place, and time.  Skin: Skin is warm and dry.    Data Reviewed Notes  Assessment    Periumbilical hernia, appears to have partial incarceration, chronic. Symptomatic from time to time    Plan   Recommended repair to be done under MAC. Pt is agreeable. Hernia precautions and incarceration were discussed with the  patient. If they develop symptoms of an incarcerated hernia, they were encouraged to seek prompt medical attention.  I have recommended repair of the hernia with or without mesh on an outpatient basis in the near future. The risk of infection was reviewed. The role of prosthetic mesh to minimize the risk of recurrence was reviewed.  Patient's surgery has been scheduled for 10-08-16 at [facility].    This information has been scribed by Rebeca Morris, CMA          SANKAR,SEEPLAPUTHUR G 09/17/2016, 2:07 PM   

## 2016-09-18 ENCOUNTER — Telehealth: Payer: Self-pay

## 2016-09-18 NOTE — Telephone Encounter (Signed)
Patient notified of Pre Admit testing time. The patient will pre admit at the hospital on 09/28/16 at 1:00 pm. She is aware of date, time, and instructions.

## 2016-09-28 ENCOUNTER — Inpatient Hospital Stay: Admission: RE | Admit: 2016-09-28 | Payer: Commercial Managed Care - HMO | Source: Ambulatory Visit

## 2016-10-01 ENCOUNTER — Telehealth: Payer: Self-pay | Admitting: *Deleted

## 2016-10-01 NOTE — Telephone Encounter (Signed)
Patient called the office back to report that she was unaware of appointment. She will call and get this rescheduled.

## 2016-10-01 NOTE — Telephone Encounter (Signed)
Per Denny PeonErin with the Pre-admission Testing department, patient missed her pre-admit appointment on Friday.   Message left on home number for patient to call the office. We need to find out why she missed appointment and give her the number to call and get it rescheduled- [phone].  Patient's [facility].

## 2016-10-02 ENCOUNTER — Encounter
Admission: RE | Admit: 2016-10-02 | Discharge: 2016-10-02 | Disposition: A | Discharge status: Home / Self Care | Payer: Medicare HMO | Source: Ambulatory Visit | Attending: General Surgery | Admitting: General Surgery

## 2016-10-02 DIAGNOSIS — I10 Essential (primary) hypertension: Secondary | ICD-10-CM | POA: Insufficient documentation

## 2016-10-02 DIAGNOSIS — Z01810 Encounter for preprocedural cardiovascular examination: Secondary | ICD-10-CM | POA: Insufficient documentation

## 2016-10-02 NOTE — Patient Instructions (Signed)
Your procedure is scheduled on: Monday 10/08/16 Report to Day Surgery. 2ND FLOOR MEDICAL MALL ENTRANCE To find out your arrival time please call [phone] between 1PM - 3PM on Friday 10/05/16.  Remember: Instructions that are not followed completely may result in serious medical risk, up to and including death, or upon the discretion of your surgeon and anesthesiologist your surgery may need to be rescheduled.    __X__ 1. Do not eat food or drink liquids after midnight. No gum chewing or hard candies.     __X__ 2. No Alcohol for 24 hours before or after surgery.   ____ 3. Bring all medications with you on the day of surgery if instructed.    __X__ 4. Notify your doctor if there is any change in your medical condition     (cold, fever, infections).     Do not wear jewelry, make-up, hairpins, clips or nail polish.  Do not wear lotions, powders, or perfumes.   Do not shave 48 hours prior to surgery. Men may shave face and neck.  Do not bring valuables to the hospital.    [facility] not [facility] for any belongings or valuables.               Contacts, dentures or bridgework may not be worn into surgery.  Leave your suitcase in the car. After surgery it may be brought to your room.  For patients admitted to the hospital, discharge time is determined by your                treatment team.   Patients discharged the day of surgery will not be allowed to drive home.   Please read over the following fact sheets that you were given:   Pain Booklet   __X__ Take these medicines the morning of surgery with A SIP OF WATER:    1. BENAZEPRIL  2.   3.   4.  5.  6.  ____ Fleet Enema (as directed)   __X__ Use CHG Soap as directed  ____ Use inhalers on the day of surgery  ____ Stop metformin 2 days prior to surgery    ____ Take 1/2 of usual insulin dose the night before surgery and none on the morning of surgery.   __X__ Stop Coumadin/Plavix/aspirin on NO BC POWDER STARTING  TODAY, MAY USE TYLENOL FOR PAIN  ____ Stop Anti-inflammatories on    ____ Stop supplements until after surgery.    ____ Bring C-Pap to the hospital.

## 2016-10-08 ENCOUNTER — Ambulatory Visit: Payer: Medicare HMO | Admitting: Anesthesiology

## 2016-10-08 ENCOUNTER — Encounter
Admission: RE | Disposition: A | Discharge status: Home / Self Care | Payer: Self-pay | Source: Ambulatory Visit | Attending: General Surgery

## 2016-10-08 ENCOUNTER — Encounter: Payer: Self-pay | Admitting: *Deleted

## 2016-10-08 ENCOUNTER — Ambulatory Visit
Admission: RE | Admit: 2016-10-08 | Discharge: 2016-10-08 | Disposition: A | Discharge status: Home / Self Care | Payer: Medicare HMO | Source: Ambulatory Visit | Attending: General Surgery | Admitting: General Surgery

## 2016-10-08 DIAGNOSIS — K430 Incisional hernia with obstruction, without gangrene: Secondary | ICD-10-CM | POA: Diagnosis not present

## 2016-10-08 DIAGNOSIS — Z79899 Other long term (current) drug therapy: Secondary | ICD-10-CM | POA: Insufficient documentation

## 2016-10-08 DIAGNOSIS — K420 Umbilical hernia with obstruction, without gangrene: Secondary | ICD-10-CM | POA: Diagnosis not present

## 2016-10-08 DIAGNOSIS — K439 Ventral hernia without obstruction or gangrene: Secondary | ICD-10-CM | POA: Diagnosis not present

## 2016-10-08 DIAGNOSIS — I10 Essential (primary) hypertension: Secondary | ICD-10-CM | POA: Diagnosis not present

## 2016-10-08 DIAGNOSIS — Z7982 Long term (current) use of aspirin: Secondary | ICD-10-CM | POA: Insufficient documentation

## 2016-10-08 DIAGNOSIS — K429 Umbilical hernia without obstruction or gangrene: Secondary | ICD-10-CM | POA: Diagnosis present

## 2016-10-08 HISTORY — PX: VENTRAL HERNIA REPAIR: SHX424

## 2016-10-08 SURGERY — REPAIR, HERNIA, VENTRAL
Anesthesia: General | Site: Abdomen | Wound class: Clean

## 2016-10-08 MED ORDER — FENTANYL CITRATE (PF) 100 MCG/2ML IJ SOLN
INTRAMUSCULAR | Status: DC | PRN
Start: 2016-10-08 — End: 2016-10-08
  Administered 2016-10-08 (×2): 50 ug via INTRAVENOUS

## 2016-10-08 MED ORDER — LIDOCAINE HCL (PF) 1 % IJ SOLN
INTRAMUSCULAR | Status: AC
  Filled 2016-10-08: qty 30

## 2016-10-08 MED ORDER — FAMOTIDINE 20 MG PO TABS
ORAL_TABLET | ORAL | Status: AC
  Filled 2016-10-08: qty 1

## 2016-10-08 MED ORDER — CEFAZOLIN SODIUM-DEXTROSE 2-4 GM/100ML-% IV SOLN
2.0000 g | INTRAVENOUS | Status: AC
  Administered 2016-10-08: 2 g via INTRAVENOUS

## 2016-10-08 MED ORDER — BUPIVACAINE HCL (PF) 0.5 % IJ SOLN
INTRAMUSCULAR | Status: DC | PRN
  Administered 2016-10-08: 15 mL

## 2016-10-08 MED ORDER — ROCURONIUM BROMIDE 100 MG/10ML IV SOLN
INTRAVENOUS | Status: DC | PRN
Start: 2016-10-08 — End: 2016-10-08
  Administered 2016-10-08: 20 mg via INTRAVENOUS

## 2016-10-08 MED ORDER — ONDANSETRON HCL 4 MG/2ML IJ SOLN
INTRAMUSCULAR | Status: DC | PRN
  Administered 2016-10-08: 4 mg via INTRAVENOUS

## 2016-10-08 MED ORDER — PROMETHAZINE HCL 25 MG/ML IJ SOLN: 6.2500 mg | INTRAMUSCULAR | Status: DC | PRN

## 2016-10-08 MED ORDER — PROPOFOL 10 MG/ML IV BOLUS
INTRAVENOUS | Status: DC | PRN
  Administered 2016-10-08: 120 mg via INTRAVENOUS

## 2016-10-08 MED ORDER — ACETAMINOPHEN 10 MG/ML IV SOLN
INTRAVENOUS | Status: DC | PRN
  Administered 2016-10-08: 1000 mg via INTRAVENOUS

## 2016-10-08 MED ORDER — FAMOTIDINE 20 MG PO TABS
20.0000 mg | ORAL_TABLET | Freq: Once | ORAL | Status: AC
  Administered 2016-10-08: 20 mg via ORAL

## 2016-10-08 MED ORDER — TRAMADOL HCL 50 MG PO TABS: 50.0000 mg | ORAL_TABLET | Freq: Four times a day (QID) | ORAL | 0 refills | Status: DC | PRN

## 2016-10-08 MED ORDER — ACETAMINOPHEN 10 MG/ML IV SOLN
INTRAVENOUS | Status: AC
  Filled 2016-10-08: qty 100

## 2016-10-08 MED ORDER — CHLORHEXIDINE GLUCONATE CLOTH 2 % EX PADS: 6.0000 | MEDICATED_PAD | Freq: Once | CUTANEOUS | Status: DC

## 2016-10-08 MED ORDER — OXYCODONE HCL 5 MG/5ML PO SOLN: 5.0000 mg | Freq: Once | ORAL | Status: DC | PRN

## 2016-10-08 MED ORDER — LACTATED RINGERS IV SOLN
INTRAVENOUS | Status: DC
  Administered 2016-10-08: 10:00:00 via INTRAVENOUS

## 2016-10-08 MED ORDER — FENTANYL CITRATE (PF) 100 MCG/2ML IJ SOLN: 25.0000 ug | INTRAMUSCULAR | Status: DC | PRN

## 2016-10-08 MED ORDER — EPHEDRINE SULFATE 50 MG/ML IJ SOLN
INTRAMUSCULAR | Status: DC | PRN
  Administered 2016-10-08: 10 mg via INTRAVENOUS

## 2016-10-08 MED ORDER — TRAMADOL HCL 50 MG PO TABS
ORAL_TABLET | ORAL | Status: AC
  Filled 2016-10-08: qty 1

## 2016-10-08 MED ORDER — LIDOCAINE HCL 1 % IJ SOLN
INTRAMUSCULAR | Status: DC | PRN
  Administered 2016-10-08: 15 mL

## 2016-10-08 MED ORDER — MEPERIDINE HCL 25 MG/ML IJ SOLN: 6.2500 mg | INTRAMUSCULAR | Status: DC | PRN

## 2016-10-08 MED ORDER — MIDAZOLAM HCL 2 MG/2ML IJ SOLN
INTRAMUSCULAR | Status: DC | PRN
Start: 2016-10-08 — End: 2016-10-08
  Administered 2016-10-08: 1 mg via INTRAVENOUS

## 2016-10-08 MED ORDER — TRAMADOL HCL 50 MG PO TABS
50.0000 mg | ORAL_TABLET | Freq: Four times a day (QID) | ORAL | Status: DC
  Administered 2016-10-08: 50 mg via ORAL

## 2016-10-08 MED ORDER — DEXAMETHASONE SODIUM PHOSPHATE 10 MG/ML IJ SOLN
INTRAMUSCULAR | Status: DC | PRN
  Administered 2016-10-08: 4 mg via INTRAVENOUS

## 2016-10-08 MED ORDER — LIDOCAINE HCL (CARDIAC) 20 MG/ML IV SOLN
INTRAVENOUS | Status: DC | PRN
  Administered 2016-10-08: 50 mg via INTRAVENOUS

## 2016-10-08 MED ORDER — OXYCODONE HCL 5 MG PO TABS: 5.0000 mg | ORAL_TABLET | Freq: Once | ORAL | Status: DC | PRN

## 2016-10-08 MED ORDER — BUPIVACAINE HCL (PF) 0.5 % IJ SOLN
INTRAMUSCULAR | Status: AC
  Filled 2016-10-08: qty 30

## 2016-10-08 MED ORDER — CEFAZOLIN SODIUM-DEXTROSE 2-4 GM/100ML-% IV SOLN
INTRAVENOUS | Status: AC
  Filled 2016-10-08: qty 100

## 2016-10-08 MED ORDER — SUGAMMADEX SODIUM 200 MG/2ML IV SOLN
INTRAVENOUS | Status: DC | PRN
  Administered 2016-10-08: 120 mg via INTRAVENOUS

## 2016-10-08 SURGICAL SUPPLY — 30 items
BLADE SURG 15 STRL SS SAFETY (BLADE) ×3 IMPLANT
CANISTER SUCT 1200ML W/VALVE (MISCELLANEOUS) ×3 IMPLANT
CHLORAPREP W/TINT 26ML (MISCELLANEOUS) ×3 IMPLANT
DECANTER SPIKE VIAL GLASS SM (MISCELLANEOUS) ×3 IMPLANT
DERMABOND ADVANCED (GAUZE/BANDAGES/DRESSINGS) ×2
DERMABOND ADVANCED .7 DNX12 (GAUZE/BANDAGES/DRESSINGS) ×1 IMPLANT
DRAPE LAPAROTOMY 100X77 ABD (DRAPES) ×3 IMPLANT
ELECT REM PT RETURN 9FT ADLT (ELECTROSURGICAL) ×3
ELECTRODE REM PT RTRN 9FT ADLT (ELECTROSURGICAL) ×1 IMPLANT
GLOVE BIO SURGEON STRL SZ7 (GLOVE) ×9 IMPLANT
GOWN STRL REUS W/ TWL LRG LVL3 (GOWN DISPOSABLE) ×3 IMPLANT
GOWN STRL REUS W/TWL LRG LVL3 (GOWN DISPOSABLE) ×6
KIT RM TURNOVER STRD PROC AR (KITS) ×3 IMPLANT
LABEL OR SOLS (LABEL) ×3 IMPLANT
MESH VENTRALEX ST 2.5 CRC MED (Mesh General) ×3 IMPLANT
NDL HPO THNWL 1X22GA REG BVL (NEEDLE) ×1 IMPLANT
NEEDLE HYPO 25X1 1.5 SAFETY (NEEDLE) IMPLANT
NEEDLE SAFETY 22GX1 (NEEDLE) ×2
NS IRRIG 500ML POUR BTL (IV SOLUTION) ×3 IMPLANT
PACK BASIN MINOR ARMC (MISCELLANEOUS) ×3 IMPLANT
SPONGE LAP 18X18 5 PK (GAUZE/BANDAGES/DRESSINGS) IMPLANT
SUT PROLENE 0 CT 1 30 (SUTURE) ×6 IMPLANT
SUT PROLENE 0 CT 2 (SUTURE) IMPLANT
SUT VIC AB 2-0 CT1 27 (SUTURE) ×4
SUT VIC AB 2-0 CT1 TAPERPNT 27 (SUTURE) ×2 IMPLANT
SUT VIC AB 3-0 SH 27 (SUTURE) ×2
SUT VIC AB 3-0 SH 27X BRD (SUTURE) ×1 IMPLANT
SUT VIC AB 4-0 FS2 27 (SUTURE) ×3 IMPLANT
SUT VICRYL+ 3-0 144IN (SUTURE) ×3 IMPLANT
SYR CONTROL 10ML (SYRINGE) ×3 IMPLANT

## 2016-10-08 NOTE — Anesthesia Procedure Notes (Signed)
Procedure Name: Intubation Date/Time: 10/08/2016 10:46 AM Performed by: Ginger CarneMICHELET, Machaela Caterino Pre-anesthesia Checklist: Patient identified, Emergency Drugs available, Suction available, Patient being monitored and Timeout performed Patient Re-evaluated:Patient Re-evaluated prior to inductionOxygen Delivery Method: Circle system utilized Preoxygenation: Pre-oxygenation with 100% oxygen Intubation Type: IV induction Ventilation: Mask ventilation without difficulty and Oral airway inserted - appropriate to patient size Laryngoscope Size: Miller and 2 Grade View: Grade I Tube type: Oral Number of attempts: 1 Airway Equipment and Method: Stylet Secured at: 20 cm Tube secured with: Tape Dental Injury: Teeth and Oropharynx as per pre-operative assessment

## 2016-10-08 NOTE — Addendum Note (Signed)
Addendum  created 10/08/16 1242 by Ginger CarneStephanie Luverne Zerkle, CRNA   Charge Capture section accepted

## 2016-10-08 NOTE — Anesthesia Postprocedure Evaluation (Signed)
Anesthesia Post Note  Patient: Rhonda Ingram  Procedure(s) Performed: Procedure(s) (LRB): HERNIA REPAIR VENTRAL ADULT (N/A)  Patient location during evaluation: PACU Anesthesia Type: General Level of consciousness: awake and alert Pain management: pain level controlled Vital Signs Assessment: post-procedure vital signs reviewed and stable Respiratory status: spontaneous breathing, nonlabored ventilation and respiratory function stable Cardiovascular status: blood pressure returned to baseline and stable Postop Assessment: no signs of nausea or vomiting Anesthetic complications: no    Last Vitals:  Vitals:   10/08/16 1202 10/08/16 1210  BP:  (!) 158/79  Pulse: 75 77  Resp: 18 16  Temp:      Last Pain:  Vitals:   10/08/16 1154  TempSrc:   PainSc: Asleep                 Kristiann Noyce

## 2016-10-08 NOTE — Op Note (Signed)
Preop diagnosis: Ventral periumbilical hernia  Post op diagnosis: Same 2 hernias  Operation: Repair of incarcerated ventral hernia with use of mesh  Surgeon: Kathreen CosierS. G. Lashawn Bromwell  Assistant:     Anesthesia: Gen.  Complications: None  EBL: Less than 10 mL  Drains: None  Description: Patient was placed supine and put to sleep in the operating table. The abdomen was prepped and draped as sterile field and timeout performed. Local anesthetic containing 0.5% Marcaine mixed with 1% Xylocaine was instilled around the umbilical area to obtain postop analgesia total of 30 mL used. Elliptical incision was made from above the umbilicus to just below the umbilicus. Incision was deepened through the subcutaneous tissue and a fatty hernia containing omental fat was identified above the umbilicus with a fingerbreadth slightly smaller than a fingerbreadth defect. After the hernia was satisfactorily freed the incarcerated portion of the omentum was clamped cut and ligated with 3-0 Vicryl and the remaining portion pushed back in the peritoneal cavity. Is noted there was a second hernia inferior to this with a small opening about half a centimeter inferior to the mid the large defect where a portion of the omentum was also stuck with careful dissection the fascial opening was extended downward until this incarcerated of portion of the omentum was freed and. A vertical 2 cm defect was encountered and was decided to use a mesh to repair this. Accordingly the posterior sheath area was opened on either side and the retrorectus space was opened to allow for placement of mesh. This space was connected superiorly and inferiorly and a 6 cm Ventralex ST patch was position in the space and the central straps pulled up to make it even. The posterior sheath prior to placement of the mesh was closed with 2-0 Vicryl. The anterior sheath was then closed with interrupted figure-of-eight stitches of 0 Vicryl proline incorporating the  anterior leaves and the excess of leaflet was cut off. Reinforcement repair was obtained. Subcutaneous tissue closed with 2-0 Vicryl. Skin closed with subcuticular 4-0 Vicryl covered with Dermabond. Procedure was well-tolerated with no immediate problems encountered she was subsequently returned recovery room stable condition

## 2016-10-08 NOTE — Discharge Instructions (Signed)

## 2016-10-08 NOTE — H&P (View-Only) (Signed)
Patient ID: Rhonda Ingram, female   DOB: 05/26/1926, 82 y.o.   MRN: [id number]  Chief Complaint  Patient presents with  . Other    umbilical hernia     HPI Rhonda ProudMary M Cappiello is a 82 y.o. female here today for an evaluation of a umbilical hernia.  The patient states she first noticed this last year when lifting a box.  She states she has occasional tenderness.  Denies any GI issues.  I have reviewed the history of present illness with the patient.  HPI  Past Medical History:  Diagnosis Date  . Arthritis    osteoarthritis  . Hyperlipidemia   . Hypertension   . Osteoporosis   . Vitamin D deficiency     Past Surgical History:  Procedure Laterality Date  . CHOLECYSTECTOMY    . EYE SURGERY     cataract extraction    Family History  Problem Relation Age of Onset  . Hypertension Mother   . Stroke Mother   . Cancer Father     lung  . Hypertension Sister   . Hyperlipidemia Sister   . Hypertension Daughter   . Cerebral palsy Daughter   . Hypertension Maternal Grandmother   . Hypertension Maternal Grandfather   . Hypertension Paternal Grandmother   . Hypertension Paternal Grandfather   . Hypertension Sister   . Hyperlipidemia Sister   . Migraines Daughter   . Diabetes Brother   . Hypertension Brother   . Hyperlipidemia Brother     Social History Social History  Substance Use Topics  . Smoking status: Never Smoker  . Smokeless tobacco: Never Used  . Alcohol use No    No Known Allergies  Current Outpatient Prescriptions  Medication Sig Dispense Refill  . benazepril (LOTENSIN) 5 MG tablet Take 1 tablet (5 mg total) by mouth daily. 90 tablet 4  . diphenhydrAMINE (BENADRYL) 25 mg capsule Take 25 mg by mouth every 6 (six) hours as needed.    . diphenhydramine-acetaminophen (TYLENOL PM) 25-500 MG TABS tablet Take 1 tablet by mouth at bedtime as needed (sleep).    . EPIPEN 2-PAK 0.3 MG/0.3ML SOAJ injection     . fluticasone (FLONASE) 50 MCG/ACT nasal spray     .  triamcinolone cream (KENALOG) 0.1 % APPLY TOPICALLY TWO TIMES DAILY. 30 g 0   No current facility-administered medications for this visit.     Review of Systems Review of Systems  Constitutional: Negative.   Respiratory: Negative.   Cardiovascular: Negative.     Blood pressure 110/68, pulse 62, resp. rate 12, height 5' (1.524 m), weight 118 lb (53.5 kg).  Physical Exam Physical Exam  Constitutional: She is oriented to person, place, and time. She appears well-developed and well-nourished.  Eyes: Conjunctivae are normal. No scleral icterus.  Neck: Neck supple.  Cardiovascular: Normal rate, regular rhythm and normal heart sounds.   Pulmonary/Chest: Effort normal and breath sounds normal.  Abdominal: Soft. Bowel sounds are normal. A hernia (3-4 cm hernia at and just above umbilicus. Not fully reducible. Mildly tender ) is present.  Lymphadenopathy:    She has no cervical adenopathy.  Neurological: She is alert and oriented to person, place, and time.  Skin: Skin is warm and dry.    Data Reviewed Notes  Assessment    Periumbilical hernia, appears to have partial incarceration, chronic. Symptomatic from time to time    Plan   Recommended repair to be done under MAC. Pt is agreeable. Hernia precautions and incarceration were discussed with the  patient. If they develop symptoms of an incarcerated hernia, they were encouraged to seek prompt medical attention.  I have recommended repair of the hernia with or without mesh on an outpatient basis in the near future. The risk of infection was reviewed. The role of prosthetic mesh to minimize the risk of recurrence was reviewed.  Patient's [facility]08-[facility].    This information has been scribed by Milas Kocherebeca Morris, CMA          Gerlene BurdockSANKAR,Tamekia Rotter G 09/17/2016, 2:07 PM

## 2016-10-08 NOTE — Interval H&P Note (Signed)
History and Physical Interval Note:  10/08/2016 10:02 AM  Rhonda Ingram  has presented today for surgery, with the diagnosis of ventral hernia  The various methods of treatment have been discussed with the patient and family. After consideration of risks, benefits and other options for treatment, the patient has consented to  Procedure(s): HERNIA REPAIR VENTRAL ADULT (N/A) as a surgical intervention .  The patient's history has been reviewed, patient examined, no change in status, stable for surgery.  I have reviewed the patient's chart and labs.  Questions were answered to the patient's satisfaction.     SANKAR,SEEPLAPUTHUR G

## 2016-10-08 NOTE — Anesthesia Preprocedure Evaluation (Signed)
Anesthesia Evaluation  Patient identified by MRN, date of birth, ID band Patient awake    Reviewed: Allergy & Precautions, NPO status , Patient's Chart, lab work & pertinent test results  History of Anesthesia Complications Negative for: history of anesthetic complications  Airway Mallampati: II  TM Distance: >3 FB Neck ROM: Full    Dental   Pulmonary neg pulmonary ROS, neg sleep apnea, neg COPD,    breath sounds clear to auscultation- rhonchi (-) wheezing      Cardiovascular hypertension, Pt. on medications (-) CAD, (-) Past MI and (-) Cardiac Stents  Rhythm:Regular Rate:Normal - Systolic murmurs and - Diastolic murmurs    Neuro/Psych negative neurological ROS  negative psych ROS   GI/Hepatic negative GI ROS, Neg liver ROS,   Endo/Other  negative endocrine ROSneg diabetes  Renal/GU negative Renal ROS     Musculoskeletal  (+) Arthritis , Osteoarthritis,    Abdominal (+) - obese,   Peds  Hematology negative hematology ROS (+)   Anesthesia Other Findings Past Medical History: No date: Arthritis     Comment: osteoarthritis No date: Hyperlipidemia No date: Hypertension No date: Osteoporosis No date: Vitamin D deficiency   Reproductive/Obstetrics                             Anesthesia Physical Anesthesia Plan  ASA: II  Anesthesia Plan: General   Post-op Pain Management:    Induction: Intravenous  Airway Management Planned: Oral ETT  Additional Equipment:   Intra-op Plan:   Post-operative Plan: Extubation in OR  Informed Consent: I have reviewed the patients History and Physical, chart, labs and discussed the procedure including the risks, benefits and alternatives for the proposed anesthesia with the patient or authorized representative who has indicated his/her understanding and acceptance.   Dental advisory given  Plan Discussed with: CRNA and  Anesthesiologist  Anesthesia Plan Comments:         Anesthesia Quick Evaluation

## 2016-10-08 NOTE — Transfer of Care (Signed)
Immediate Anesthesia Transfer of Care Note  Patient: Rhonda Ingram  Procedure(s) Performed: Procedure(s): HERNIA REPAIR VENTRAL ADULT (N/A)  Patient Location: PACU  Anesthesia Type:General  Level of Consciousness: sedated  Airway & Oxygen Therapy: Patient Spontanous Breathing and Patient connected to face mask oxygen  Post-op Assessment: Report given to RN and Post -op Vital signs reviewed and stable  Post vital signs: Reviewed and stable  Last Vitals:  Vitals:   10/08/16 0940 10/08/16 1154  BP: (!) 174/88 (!) 157/77  Pulse: 81 73  Resp: 18 11  Temp: 36.6 C 36.7 C    Complications: No apparent anesthesia complications

## 2016-10-09 ENCOUNTER — Telehealth: Payer: Self-pay | Admitting: *Deleted

## 2016-10-09 ENCOUNTER — Telehealth: Payer: Self-pay | Admitting: Family Medicine

## 2016-10-09 NOTE — Telephone Encounter (Signed)
Per Dr. Dossie Arbourrissman, patient needs to contact her gastroenterologist, the one who did her hernia repair.

## 2016-10-09 NOTE — Telephone Encounter (Signed)
Pt had hernia repair surgery yesterday and stated that she hasn't had a bowel movement in 4 days. She took 2 stool softeners yesterday and 1 this morning and has only passed a little gas and would like suggestions.

## 2016-10-09 NOTE — Telephone Encounter (Signed)
She called to ask about postoperative constipation. Advised to use Miralax and continue the stool softeners, pt agrees

## 2016-10-09 NOTE — Telephone Encounter (Signed)
Patient notified

## 2016-10-18 ENCOUNTER — Ambulatory Visit: Payer: Medicare HMO | Admitting: General Surgery

## 2016-10-22 ENCOUNTER — Encounter: Payer: Self-pay | Admitting: General Surgery

## 2016-10-22 ENCOUNTER — Ambulatory Visit (INDEPENDENT_AMBULATORY_CARE_PROVIDER_SITE_OTHER): Payer: Commercial Managed Care - HMO | Admitting: General Surgery

## 2016-10-22 VITALS — BP 136/70 | HR 68 | Resp 13 | Ht 60.0 in | Wt 116.0 lb

## 2016-10-22 DIAGNOSIS — K429 Umbilical hernia without obstruction or gangrene: Secondary | ICD-10-CM

## 2016-10-22 NOTE — Progress Notes (Signed)
Patient ID: Rhonda Ingram, female   DOB: 03/14/1926, 82 y.o.   MRN: [id number]  Chief Complaint  [name]    HPI Rhonda Ingram is a 82 y.o. female here for a post op from a ventral hernia repair done on 10/08/16. She reports that she is doing well. She has had no problems with eating or using the bathroom.  I have reviewed the history of present illness with the patient.  HPI  Past Medical History:  Diagnosis Date  . Arthritis    osteoarthritis  . Hyperlipidemia   . Hypertension   . Osteoporosis   . Vitamin D deficiency     Past Surgical History:  Procedure Laterality Date  . CHOLECYSTECTOMY    . EYE SURGERY     cataract extraction  . VENTRAL HERNIA REPAIR N/A 10/08/2016   Procedure: HERNIA REPAIR VENTRAL ADULT;  Surgeon: Kieth BrightlySeeplaputhur G Sankar, MD;  Location: [facility] ORS;  Service: General;  Laterality: N/A;    Family History  Problem Relation Age of Onset  . Hypertension Mother   . Stroke Mother   . Cancer Father     lung  . Hypertension Sister   . Hyperlipidemia Sister   . Hypertension Daughter   . Cerebral palsy Daughter   . Hypertension Maternal Grandmother   . Hypertension Maternal Grandfather   . Hypertension Paternal Grandmother   . Hypertension Paternal Grandfather   . Hypertension Sister   . Hyperlipidemia Sister   . Migraines Daughter   . Diabetes Brother   . Hypertension Brother   . Hyperlipidemia Brother     Social History Social History  Substance Use Topics  . Smoking status: Never Smoker  . Smokeless tobacco: Never Used  . Alcohol use No    Allergies  Allergen Reactions  . Meat Extract Shortness Of Breath  . Other     Alpha-gal allergy (meat allergy or Mammalian Meat Allergy) beef/red meat/pork    Current Outpatient Prescriptions  Medication Sig Dispense Refill  . Aspirin-Salicylamide-Caffeine (BC FAST PAIN RELIEF) 650-195-33.3 MG PACK Take 1 packet by mouth 2 (two) times daily as needed (for  pain/headache.).    [name]. benazepril (LOTENSIN) 5 MG tablet Take 1 tablet (5 mg total) by mouth daily. 90 tablet 4  . diphenhydrAMINE (BENADRYL) 25 mg capsule Take 25 mg by mouth every 6 (six) hours as needed (for itching/allergies).     . diphenhydramine-acetaminophen (TYLENOL PM) 25-500 MG TABS tablet Take 1 tablet by mouth at bedtime as needed (sleep).    . EPIPEN 2-PAK 0.3 MG/0.3ML SOAJ injection Inject 0.3 mg into the muscle daily as needed (for allergic reaction).     . fluticasone (FLONASE) 50 MCG/ACT nasal spray Place 1-2 sprays into both nostrils daily as needed (for allergies.).     [name]. triamcinolone cream (KENALOG) 0.1 % APPLY TOPICALLY TWO TIMES DAILY. (Patient taking differently: APPLY TOPICALLY TWO TIMES DAILY AS NEEDED FOR ITCHING) 30 g 0   No current facility-administered medications for this visit.     Review of Systems Review of Systems  Constitutional: Negative.   Respiratory: Negative.   Cardiovascular: Negative.     Blood pressure 136/70, pulse 68, resp. rate 13, height 5' (1.524 m), weight 116 lb (52.6 kg).  Physical Exam Physical Exam  Constitutional: She is oriented to person, place, and time. She appears well-developed and well-nourished.  Abdominal: Soft. Normal appearance. There is no tenderness.    Neurological: She is alert and oriented to person, place,  and time.  Skin: Skin is warm and dry.  Psychiatric: She has a normal mood and affect.    Data Reviewed Op note  Assessment    Post op repair ventral hernia- 6cm Ventralex patch. Doing well.    Plan    Avoid exertional activity for another week. Then advance as tolerated.   Follow up in 4 weeks This has been scribed by Sinda Duaryl-Lyn M Kennedy LPN    Gerlene BurdockSANKAR,SEEPLAPUTHUR G 10/22/2016, 3:20 PM

## 2016-10-22 NOTE — Patient Instructions (Signed)
No physical activity for the next two weeks. Use proper lifting techniques. Follow up in 4 weeks.

## 2016-11-26 ENCOUNTER — Encounter: Payer: Self-pay | Admitting: General Surgery

## 2016-11-26 ENCOUNTER — Ambulatory Visit (INDEPENDENT_AMBULATORY_CARE_PROVIDER_SITE_OTHER): Payer: Medicare HMO | Admitting: General Surgery

## 2016-11-26 VITALS — BP 130/70 | HR 68 | Resp 14 | Ht 60.0 in | Wt 115.0 lb

## 2016-11-26 DIAGNOSIS — K429 Umbilical hernia without obstruction or gangrene: Secondary | ICD-10-CM

## 2016-11-26 NOTE — Progress Notes (Signed)
Patient ID: Rhonda Ingram, female   DOB: [birth year], 83 y.o.   MRN: [id number]  Chief Complaint  Patient presents with  . Follow-up    HPI Rhonda Ingram is a 83 y.o. female here for a post op from a ventral hernia repair done on 10/08/16. She reports that she is doing well, no complaints. I have reviewed the history of present illness with the patient.   Rhonda Ingram    HPI  Past Medical History:  Diagnosis Date  . Arthritis    osteoarthritis  . Hyperlipidemia   . Hypertension   . Osteoporosis   . Vitamin D deficiency     Past Surgical History:  Procedure Laterality Date  . CHOLECYSTECTOMY    . EYE SURGERY     cataract extraction  . VENTRAL HERNIA REPAIR N/A 10/08/2016   Procedure: HERNIA REPAIR VENTRAL ADULT;  Surgeon: Kieth Brightly, MD;  Location: [facility] ORS;  Service: General;  Laterality: N/A;    Family History  Problem Relation Age of Onset  . Hypertension Mother   . Stroke Mother   . Cancer Father     lung  . Hypertension Sister   . Hyperlipidemia Sister   . Hypertension Daughter   . Cerebral palsy Daughter   . Hypertension Maternal Grandmother   . Hypertension Maternal Grandfather   . Hypertension Paternal Grandmother   . Hypertension Paternal Grandfather   . Hypertension Sister   . Hyperlipidemia Sister   . Migraines Daughter   . Diabetes Brother   . Hypertension Brother   . Hyperlipidemia Brother     Social History Social History  Substance Use Topics  . Smoking status: Never Smoker  . Smokeless tobacco: Never Used  . Alcohol use No    Allergies  Allergen Reactions  . Meat Extract Shortness Of Breath  . Other     Alpha-gal allergy (meat allergy or Mammalian Meat Allergy) beef/red meat/pork    Current Outpatient Prescriptions  Medication Sig Dispense Refill  . Aspirin-Salicylamide-Caffeine (BC FAST PAIN RELIEF) 650-195-33.3 MG PACK Take 1 packet by mouth 2 (two) times daily as needed (for pain/headache.).    Rhonda Ingram benazepril (LOTENSIN) 5 MG  tablet Take 1 tablet (5 mg total) by mouth daily. 90 tablet 4  . diphenhydrAMINE (BENADRYL) 25 mg capsule Take 25 mg by mouth every 6 (six) hours as needed (for itching/allergies).     . diphenhydramine-acetaminophen (TYLENOL PM) 25-500 MG TABS tablet Take 1 tablet by mouth at bedtime as needed (sleep).    . EPIPEN 2-PAK 0.3 MG/0.3ML SOAJ injection Inject 0.3 mg into the muscle daily as needed (for allergic reaction).     . fluticasone (FLONASE) 50 MCG/ACT nasal spray Place 1-2 sprays into both nostrils daily as needed (for allergies.).     Rhonda Ingram triamcinolone cream (KENALOG) 0.1 % APPLY TOPICALLY TWO TIMES DAILY. (Patient taking differently: APPLY TOPICALLY TWO TIMES DAILY AS NEEDED FOR ITCHING) 30 g 0   No current facility-administered medications for this visit.     Review of Systems Review of Systems  Constitutional: Negative.   Respiratory: Negative.   Cardiovascular: Negative.     Blood pressure 130/70, pulse 68, resp. rate 14, height 5' (1.524 m), weight 115 lb (52.2 kg).  Physical Exam Physical Exam  Constitutional: She is oriented to person, place, and time. She appears well-developed and well-nourished.  Cardiovascular: Normal rate, regular rhythm and normal heart sounds.   Pulmonary/Chest: Effort normal and breath sounds normal.  Abdominal: Soft. Normal appearance and bowel sounds are  normal. There is no tenderness. No hernia.  Incision site is clean and well healed.   Neurological: She is alert and oriented to person, place, and time.  Skin: Skin is warm and dry.    Data Reviewed Notes reviewed   Assessment   Post op repair ventral hernia - 6cm Ventralex patch. Good recovery.  Plan     Patient instructed to increase activity as tolerated.   Return as needed.   This information has been scribed by Ples SpecterJessica Qualls CMA.    Binnie Vonderhaar G 11/26/2016, 1:38 PM

## 2016-11-26 NOTE — Patient Instructions (Signed)
Return as needed

## 2016-12-17 ENCOUNTER — Telehealth: Payer: Self-pay | Admitting: Family Medicine

## 2016-12-17 NOTE — Telephone Encounter (Signed)
Please advise 

## 2016-12-17 NOTE — Telephone Encounter (Signed)
Pt called and stated that she is pretty sure she has food poisoning and she would like to know if there is anything she can take. She stated that she has had loose stool and vomiting.  Her pharmacy is [facility].

## 2016-12-17 NOTE — Telephone Encounter (Signed)
Call pt 

## 2016-12-18 NOTE — Telephone Encounter (Signed)
Phone call Discussed with patient some GI upset patient is doing slow refeeding and may be doing a little better maybe subtle little low-grade fever will observe if still sick tomorrow patient will set up for a reevaluation.

## 2016-12-25 ENCOUNTER — Encounter: Payer: Self-pay | Admitting: General Surgery

## 2017-01-08 ENCOUNTER — Other Ambulatory Visit: Payer: Self-pay | Admitting: Family Medicine

## 2017-02-14 ENCOUNTER — Encounter: Payer: Self-pay | Admitting: Family Medicine

## 2017-02-14 ENCOUNTER — Ambulatory Visit (INDEPENDENT_AMBULATORY_CARE_PROVIDER_SITE_OTHER): Payer: Medicare HMO | Admitting: Family Medicine

## 2017-02-14 VITALS — BP 155/84 | HR 76 | Wt 114.8 lb

## 2017-02-14 DIAGNOSIS — E785 Hyperlipidemia, unspecified: Secondary | ICD-10-CM | POA: Diagnosis not present

## 2017-02-14 DIAGNOSIS — Z1329 Encounter for screening for other suspected endocrine disorder: Secondary | ICD-10-CM

## 2017-02-14 DIAGNOSIS — I10 Essential (primary) hypertension: Secondary | ICD-10-CM

## 2017-02-14 DIAGNOSIS — Z13220 Encounter for screening for lipoid disorders: Secondary | ICD-10-CM | POA: Diagnosis not present

## 2017-02-14 DIAGNOSIS — Z0000 Encounter for general adult medical examination without abnormal findings: Secondary | ICD-10-CM

## 2017-02-14 LAB — MICROSCOPIC EXAMINATION
Bacteria, UA: NONE SEEN
RBC, UA: NONE SEEN /hpf (ref 0–?)

## 2017-02-14 LAB — URINALYSIS, ROUTINE W REFLEX MICROSCOPIC
Bilirubin, UA: NEGATIVE
Glucose, UA: NEGATIVE
Ketones, UA: NEGATIVE
Nitrite, UA: NEGATIVE
Protein, UA: NEGATIVE
Specific Gravity, UA: 1.01 (ref 1.005–1.030)
Urobilinogen, Ur: 0.2 mg/dL (ref 0.2–1.0)
pH, UA: 5.5 (ref 5.0–7.5)

## 2017-02-14 MED ORDER — BENAZEPRIL HCL 5 MG PO TABS: 5.0000 mg | ORAL_TABLET | Freq: Every day | ORAL | 4 refills | Status: DC

## 2017-02-14 NOTE — Progress Notes (Signed)
BP (!) 155/84   Pulse 76   Wt 114 lb 12.8 oz (52.1 kg)   LMP  (LMP Unknown)   SpO2 95%   BMI 22.42 kg/m    Subjective:    Patient ID: Rhonda Ingram, female    DOB: 11-Sep-1926, 83 y.o.   MRN: [id number]  HPI: Rhonda Ingram is a 83 y.o. female  Chief Complaint  Patient presents with  . Follow-up  . Hypertension  Annual exam AWV metrics met  Patient all in all doing very well taking Benzapril for blood pressure 5 mg without problems noted low blood sugar spells. Takes Tylenol PM every now and then with no issues no hangover. Has triamcinolone hasn't needed any for some time but keep it on standby along with her EpiPen. Blood pressures up a little bit but due to fall risk and will not try to get lower   Relevant past medical, surgical, family and social history reviewed and updated as indicated. Interim medical history since our last visit reviewed. Allergies and medications reviewed and updated.  Review of Systems  Constitutional: Negative.   HENT: Negative.   Eyes: Negative.   Respiratory: Negative.   Cardiovascular: Negative.   Gastrointestinal: Negative.   Endocrine: Negative.   Genitourinary: Negative.   Musculoskeletal: Negative.   Skin: Negative.   Allergic/Immunologic: Negative.   Neurological: Negative.   Hematological: Negative.   Psychiatric/Behavioral: Negative.     Per HPI unless specifically indicated above     Objective:    BP (!) 155/84   Pulse 76   Wt 114 lb 12.8 oz (52.1 kg)   LMP  (LMP Unknown)   SpO2 95%   BMI 22.42 kg/m   Wt Readings from Last 3 Encounters:  02/14/17 114 lb 12.8 oz (52.1 kg)  11/26/16 115 lb (52.2 kg)  10/22/16 116 lb (52.6 kg)    Physical Exam  Constitutional: She is oriented to person, place, and time. She appears well-developed and well-nourished.  HENT:  Head: Normocephalic and atraumatic.  Right Ear: External ear normal.  Left Ear: External ear normal.  Nose: Nose normal.  Mouth/Throat: Oropharynx is  clear and moist.  Eyes: Conjunctivae and EOM are normal. Pupils are equal, round, and reactive to light.  Neck: Normal range of motion. Neck supple. Carotid bruit is not present.  Cardiovascular: Normal rate, regular rhythm and normal heart sounds.   No murmur heard. Pulmonary/Chest: Effort normal and breath sounds normal. She exhibits no mass. Breasts are asymmetrical.  Abdominal: Soft. Bowel sounds are normal. There is no hepatosplenomegaly.  Musculoskeletal: Normal range of motion.  Neurological: She is alert and oriented to person, place, and time.  Skin: No rash noted.  Psychiatric: She has a normal mood and affect. Her behavior is normal. Judgment and thought content normal.    Results for orders placed or performed in visit on 84/66/59  Basic metabolic panel  Result Value Ref Range   Glucose 92 65 - 99 mg/dL   BUN 14 8 - 27 mg/dL   Creatinine, Ser 0.77 0.57 - 1.00 mg/dL   GFR calc non Af Amer 69 >59 mL/min/1.73   GFR calc Af Amer 80 >59 mL/min/1.73   BUN/Creatinine Ratio 18 12 - 28   Sodium 141 134 - 144 mmol/L   Potassium 4.5 3.5 - 5.2 mmol/L   Chloride 102 96 - 106 mmol/L   CO2 25 18 - 29 mmol/L   Calcium 9.2 8.7 - 10.3 mg/dL      Assessment &  Plan:   Problem List Items Addressed This Visit      Cardiovascular and Mediastinum   Hypertension   Relevant Medications   benazepril (LOTENSIN) 5 MG tablet   Other Relevant Orders   CBC with Differential/Platelet   Comprehensive metabolic panel   Urinalysis, Routine w reflex microscopic     Other   Hyperlipidemia   Relevant Medications   benazepril (LOTENSIN) 5 MG tablet   Other Relevant Orders   CBC with Differential/Platelet   Comprehensive metabolic panel   Lipid panel   Urinalysis, Routine w reflex microscopic    Other Visit Diagnoses    Annual physical exam    -  Primary   Relevant Orders   CBC with Differential/Platelet   Comprehensive metabolic panel   Lipid panel   TSH   Urinalysis, Routine w reflex  microscopic   Screening cholesterol level       Relevant Orders   Lipid panel   Thyroid disorder screen       Relevant Orders   TSH       Follow up plan: Return in about 6 months (around 08/16/2017) for BMP.

## 2017-02-15 LAB — CBC WITH DIFFERENTIAL/PLATELET
Basophils Absolute: 0 10*3/uL (ref 0.0–0.2)
Basos: 1 %
EOS (ABSOLUTE): 0.2 10*3/uL (ref 0.0–0.4)
Eos: 3 %
Hematocrit: 38.6 % (ref 34.0–46.6)
Hemoglobin: 12.3 g/dL (ref 11.1–15.9)
Immature Grans (Abs): 0 10*3/uL (ref 0.0–0.1)
Immature Granulocytes: 0 %
Lymphocytes Absolute: 1.8 10*3/uL (ref 0.7–3.1)
Lymphs: 32 %
MCH: 28 pg (ref 26.6–33.0)
MCHC: 31.9 g/dL (ref 31.5–35.7)
MCV: 88 fL (ref 79–97)
Monocytes Absolute: 0.7 10*3/uL (ref 0.1–0.9)
Monocytes: 12 %
Neutrophils Absolute: 2.8 10*3/uL (ref 1.4–7.0)
Neutrophils: 52 %
Platelets: 251 10*3/uL (ref 150–379)
RBC: 4.39 x10E6/uL (ref 3.77–5.28)
RDW: 14.6 % (ref 12.3–15.4)
WBC: 5.5 10*3/uL (ref 3.4–10.8)

## 2017-02-15 LAB — COMPREHENSIVE METABOLIC PANEL
ALT: 7 IU/L (ref 0–32)
AST: 16 IU/L (ref 0–40)
Albumin/Globulin Ratio: 1.1 — ABNORMAL LOW (ref 1.2–2.2)
Albumin: 3.6 g/dL (ref 3.5–4.7)
Alkaline Phosphatase: 56 IU/L (ref 39–117)
BUN/Creatinine Ratio: 18 (ref 12–28)
BUN: 13 mg/dL (ref 8–27)
Bilirubin Total: <0.2 mg/dL (ref 0.0–1.2)
CO2: 25 mmol/L (ref 18–29)
Calcium: 9.1 mg/dL (ref 8.7–10.3)
Chloride: 101 mmol/L (ref 96–106)
Creatinine, Ser: 0.71 mg/dL (ref 0.57–1.00)
GFR calc Af Amer: 87 mL/min/{1.73_m2} (ref >59)
GFR calc non Af Amer: 76 mL/min/{1.73_m2} (ref >59)
Globulin, Total: 3.4 g/dL (ref 1.5–4.5)
Glucose: 108 mg/dL — ABNORMAL HIGH (ref 65–99)
Potassium: 4.1 mmol/L (ref 3.5–5.2)
Sodium: 139 mmol/L (ref 134–144)
Total Protein: 7 g/dL (ref 6.0–8.5)

## 2017-02-15 LAB — LIPID PANEL
Chol/HDL Ratio: 3.7 ratio (ref 0.0–4.4)
Cholesterol, Total: 230 mg/dL — ABNORMAL HIGH (ref 100–199)
HDL: 63 mg/dL (ref >39)
LDL Calculated: 129 mg/dL — ABNORMAL HIGH (ref 0–99)
Triglycerides: 190 mg/dL — ABNORMAL HIGH (ref 0–149)
VLDL Cholesterol Cal: 38 mg/dL (ref 5–40)

## 2017-02-15 LAB — TSH: TSH: 2.38 u[IU]/mL (ref 0.450–4.500)

## 2017-07-24 ENCOUNTER — Ambulatory Visit (INDEPENDENT_AMBULATORY_CARE_PROVIDER_SITE_OTHER): Payer: Medicare HMO

## 2017-07-24 VITALS — BP 144/80 | HR 72 | Temp 97.9°F | Resp 16 | Ht <= 58 in | Wt 115.4 lb

## 2017-07-24 DIAGNOSIS — Z23 Encounter for immunization: Secondary | ICD-10-CM | POA: Diagnosis not present

## 2017-07-24 DIAGNOSIS — Z0000 Encounter for general adult medical examination without abnormal findings: Secondary | ICD-10-CM | POA: Diagnosis not present

## 2017-07-24 NOTE — Progress Notes (Addendum)
Subjective:   Rhonda Ingram is a 83 y.o. female who presents for Medicare Annual (Subsequent) preventive examination.  Review of Systems:  Cardiac Risk Factors include: advanced age (>33men, >32 women);hypertension;dyslipidemia     Objective:     Vitals: BP (!) 144/80 (BP Location: Left Arm, Patient Position: Sitting)   Pulse 72   Temp 97.9 F (36.6 C)   Resp 16   Ht  (1.473 m)   Wt 115 lb 6.4 oz (52.3 kg)   LMP  (LMP Unknown)   BMI 24.12 kg/m   Body mass index is 24.12 kg/m.   Tobacco History  Smoking Status  . Never Smoker  Smokeless Tobacco  . Never Used     Counseling given: Not Answered   Past Medical History:  Diagnosis Date  . Arthritis    osteoarthritis  . Hyperlipidemia   . Hypertension   . Osteoporosis   . Vitamin D deficiency    Past Surgical History:  Procedure Laterality Date  . CHOLECYSTECTOMY    . EYE SURGERY     cataract extraction  . VENTRAL HERNIA REPAIR N/A 10/08/2016   Procedure: HERNIA REPAIR VENTRAL ADULT;  Surgeon: Kieth Brightly, MD;  Location: [facility] ORS;  Service: General;  Laterality: N/A;   Family History  Problem Relation Age of Onset  . Hypertension Mother   . Stroke Mother   . Cancer Father        lung  . Hypertension Sister   . Hyperlipidemia Sister   . Hypertension Daughter   . Cerebral palsy Daughter   . Hypertension Maternal Grandmother   . Hypertension Maternal Grandfather   . Hypertension Paternal Grandmother   . Hypertension Paternal Grandfather   . Hypertension Sister   . Hyperlipidemia Sister   . Migraines Daughter   . Diabetes Brother   . Hypertension Brother   . Hyperlipidemia Brother    History  Sexual Activity  . Sexual activity: No    Outpatient Encounter Prescriptions as of 07/24/2017  Medication Sig  . benazepril (LOTENSIN) 5 MG tablet Take 1 tablet (5 mg total) by mouth daily.  . diphenhydrAMINE (BENADRYL) 25 mg capsule Take 25 mg by mouth every 6 (six) hours as needed (for  itching/allergies).   . diphenhydramine-acetaminophen (TYLENOL PM) 25-500 MG TABS tablet Take 1 tablet by mouth at bedtime as needed (sleep).  . EPIPEN 2-PAK 0.3 MG/0.3ML SOAJ injection Inject 0.3 mg into the muscle daily as needed (for allergic reaction).   . fluticasone (FLONASE) 50 MCG/ACT nasal spray Place 1-2 sprays into both nostrils daily as needed (for allergies.).   [name] triamcinolone cream (KENALOG) 0.1 % APPLY TOPICALLY TWO TIMES DAILY.   No facility-administered encounter medications on file as of 07/24/2017.     Activities of Daily Living In your present state of health, do you have any difficulty performing the following activities: 07/24/2017 10/02/2016  Hearing? Y N  Vision? N N  Difficulty concentrating or making decisions? Y N  Walking or climbing stairs? N N  Dressing or bathing? N N  Doing errands, shopping? N N  Preparing Food and eating ? N -  Using the Toilet? N -  In the past six months, have you accidently leaked urine? N -  Do you have problems with loss of bowel control? N -  Managing your Medications? N -  Managing your Finances? N -  Housekeeping or managing your Housekeeping? N -  Some recent data might be hidden    Patient Care Team:  Steele Sizerrissman, Mark A, MD as PCP - General (Family Medicine) Steele Sizerrissman, Mark A, MD (Family Medicine) Kieth BrightlySankar, Seeplaputhur G, MD (General Surgery)    Assessment:    Exercise Activities and Dietary recommendations Current Exercise Habits: The patient does not participate in regular exercise at present, Exercise limited by: None identified  Goals    None     Fall Risk Fall Risk  07/24/2017 02/14/2017 08/15/2016 10/03/2015  Falls in the past year? No No No Yes  Number falls in past yr: - - - 1  Injury with Fall? - - - No   Depression Screen PHQ 2/9 Scores 07/24/2017 08/15/2016 10/03/2015  PHQ - 2 Score 0 0 0     Cognitive Function     6CIT Screen 07/24/2017  What Year? 0 points  What month? 0 points  What time? 0 points    Count back from 20 0 points  Months in reverse 0 points  Repeat phrase 2 points  Total Score 2    Immunization History  Administered Date(s) Administered  . Influenza, High Dose Seasonal PF 08/15/2016, 07/24/2017  . Influenza,inj,Quad PF,6+ Mos 08/02/2015  . Influenza-Unspecified 08/12/2014, 08/23/2014  . Pneumococcal Conjugate-13 02/14/2016  . Pneumococcal Polysaccharide-23 08/16/2008  . Td 10/24/2004   Screening Tests Health Maintenance  Topic Date Due  . TETANUS/TDAP  07/13/2018 (Originally 10/24/2014)  . INFLUENZA VACCINE  Completed  . DEXA SCAN  Completed  . PNA vac Low Risk Adult  Completed      Plan:    I have personally reviewed and addressed the Medicare Annual Wellness questionnaire and have noted the following in the patient's chart:  A. Medical and social history B. Use of alcohol, tobacco or illicit drugs  C. Current medications and supplements D. Functional ability and status E.  Nutritional status F.  Physical activity G. Advance directives H. List of other physicians I.  Hospitalizations, surgeries, and ER visits in previous 12 months J.  Vitals K. Screenings such as hearing and vision if needed, cognitive and depression L. Referrals and appointments - none  In addition, I have reviewed and discussed with patient certain preventive protocols, quality metrics, and best practice recommendations. A written personalized care plan for preventive services as well as general preventive health recommendations were provided to patient.   Signed,  Marin Robertsiffany Ashtan Girtman, LPN Nurse Health Advisor   MD Recommendations:none  I, as supervising physician, have reviewed the nurse health supervisors Medicare wellness visit note for this patient and concur with the findings and recommendations listed above.

## 2017-07-24 NOTE — Patient Instructions (Addendum)
Rhonda Ingram , Thank you for taking time to come for your Medicare Wellness Visit. I appreciate your ongoing commitment to your health goals. Please review the following plan we discussed and let me know if I can assist you in the future.   Screening recommendations/referrals: Colonoscopy: no longer required Mammogram: no longer required Bone Density: completed 01/12/2014 Recommended yearly ophthalmology/optometry visit for glaucoma screening and checkup Recommended yearly dental visit for hygiene and checkup  Vaccinations: Influenza vaccine: due now Pneumococcal vaccine: up to date Tdap vaccine: due, check with your insurance company for coverage Shingles vaccine: due, check with your insurance company for coverage  Advanced directives: Advance directive discussed with you today. I have provided a copy for you to complete at home and have notarized. Once this is complete please bring a copy in to our office so we can scan it into your chart.  Conditions/risks identified: recommend drinking at least 5-6 glasses of water a day.  Next appointment: Follow up on 08/20/2017 at 1:30pm with Dr.Crissman. Follow up in one year for your annual wellness exam.    Preventive Care 65 Years and Older, Female Preventive care refers to lifestyle choices and visits with your health care provider that can promote health and wellness. What does preventive care include?  A yearly physical exam. This is also called an annual well check.  Dental exams once or twice a year.  Routine eye exams. Ask your health care provider how often you should have your eyes checked.  Personal lifestyle choices, including:  Daily care of your teeth and gums.  Regular physical activity.  Eating a healthy diet.  Avoiding tobacco and drug use.  Limiting alcohol use.  Practicing safe sex.  Taking low-dose aspirin every day.  Taking vitamin and mineral supplements as recommended by your health care provider. What  happens during an annual well check? The services and screenings done by your health care provider during your annual well check will depend on your age, overall health, lifestyle risk factors, and family history of disease. Counseling  Your health care provider may ask you questions about your:  Alcohol use.  Tobacco use.  Drug use.  Emotional well-being.  Home and relationship well-being.  Sexual activity.  Eating habits.  History of falls.  Memory and ability to understand (cognition).  Work and work Astronomer.  Reproductive health. Screening  You may have the following tests or measurements:  Height, weight, and BMI.  Blood pressure.  Lipid and cholesterol levels. These may be checked every 5 years, or more frequently if you are over 68 years old.  Skin check.  Lung cancer screening. You may have this screening every year starting at age 23 if you have a 30-pack-year history of smoking and currently smoke or have quit within the past 15 years.  Fecal occult blood test (FOBT) of the stool. You may have this test every year starting at age 15.  Flexible sigmoidoscopy or colonoscopy. You may have a sigmoidoscopy every 5 years or a colonoscopy every 10 years starting at age 60.  Hepatitis C blood test.  Hepatitis B blood test.  Sexually transmitted disease (STD) testing.  Diabetes screening. This is done by checking your blood sugar (glucose) after you have not eaten for a while (fasting). You may have this done every 1-3 years.  Bone density scan. This is done to screen for osteoporosis. You may have this done starting at age 44.  Mammogram. This may be done every 1-2 years. Talk to your  health care provider about how often you should have regular mammograms. Talk with your health care provider about your test results, treatment options, and if necessary, the need for more tests. Vaccines  Your health care provider may recommend certain vaccines, such  as:  Influenza vaccine. This is recommended every year.  Tetanus, diphtheria, and acellular pertussis (Tdap, Td) vaccine. You may need a Td booster every 10 years.  Zoster vaccine. You may need this after age 93.  Pneumococcal 13-valent conjugate (PCV13) vaccine. One dose is recommended after age 80.  Pneumococcal polysaccharide (PPSV23) vaccine. One dose is recommended after age 53. Talk to your health care provider about which screenings and vaccines you need and how often you need them. This information is not intended to replace advice given to you by your health care provider. Make sure you discuss any questions you have with your health care provider. Document Released: 11/25/2015 Document Revised: 07/18/2016 Document Reviewed: 08/30/2015 Elsevier Interactive Patient Education  2017 [place] Prevention in the Home Falls can cause injuries. They can happen to people of all ages. There are many things you can do to make your home safe and to help prevent falls. What can I do on the outside of my home?  Regularly fix the edges of walkways and driveways and fix any cracks.  Remove anything that might make you trip as you walk through a door, such as a raised step or threshold.  Trim any bushes or trees on the path to your home.  Use bright outdoor lighting.  Clear any walking paths of anything that might make someone trip, such as rocks or tools.  Regularly check to see if handrails are loose or broken. Make sure that both sides of any steps have handrails.  Any raised decks and porches should have guardrails on the edges.  Have any leaves, snow, or ice cleared regularly.  Use sand or salt on walking paths during winter.  Clean up any spills in your garage right away. This includes oil or grease spills. What can I do in the bathroom?  Use night lights.  Install grab bars by the toilet and in the tub and shower. Do not use towel bars as grab bars.  Use  non-skid mats or decals in the tub or shower.  If you need to sit down in the shower, use a plastic, non-slip stool.  Keep the floor dry. Clean up any water that spills on the floor as soon as it happens.  Remove soap buildup in the tub or shower regularly.  Attach bath mats securely with double-sided non-slip rug tape.  Do not have throw rugs and other things on the floor that can make you trip. What can I do in the bedroom?  Use night lights.  Make sure that you have a light by your bed that is easy to reach.  Do not use any sheets or blankets that are too big for your bed. They should not hang down onto the floor.  Have a firm chair that has side arms. You can use this for support while you get dressed.  Do not have throw rugs and other things on the floor that can make you trip. What can I do in the kitchen?  Clean up any spills right away.  Avoid walking on wet floors.  Keep items that you use a lot in easy-to-reach places.  If you need to reach something above you, use a strong step stool that has a  grab bar.  Keep electrical cords out of the way.  Do not use floor polish or wax that makes floors slippery. If you must use wax, use non-skid floor wax.  Do not have throw rugs and other things on the floor that can make you trip. What can I do with my stairs?  Do not leave any items on the stairs.  Make sure that there are handrails on both sides of the stairs and use them. Fix handrails that are broken or loose. Make sure that handrails are as long as the stairways.  Check any carpeting to make sure that it is firmly attached to the stairs. Fix any carpet that is loose or worn.  Avoid having throw rugs at the top or bottom of the stairs. If you do have throw rugs, attach them to the floor with carpet tape.  Make sure that you have a light switch at the top of the stairs and the bottom of the stairs. If you do not have them, ask someone to add them for you. What  else can I do to help prevent falls?  Wear shoes that:  Do not have high heels.  Have rubber bottoms.  Are comfortable and fit you well.  Are closed at the toe. Do not wear sandals.  If you use a stepladder:  Make sure that it is fully opened. Do not climb a closed stepladder.  Make sure that both sides of the stepladder are locked into place.  Ask someone to hold it for you, if possible.  Clearly mark and make sure that you can see:  Any grab bars or handrails.  First and last steps.  Where the edge of each step is.  Use tools that help you move around (mobility aids) if they are needed. These include:  Canes.  Walkers.  Scooters.  Crutches.  Turn on the lights when you go into a dark area. Replace any light bulbs as soon as they burn out.  Set up your furniture so you have a clear path. Avoid moving your furniture around.  If any of your floors are uneven, fix them.  If there are any pets around you, be aware of where they are.  Review your medicines with your doctor. Some medicines can make you feel dizzy. This can increase your chance of falling. Ask your doctor what other things that you can do to help prevent falls. This information is not intended to replace advice given to you by your health care provider. Make sure you discuss any questions you have with your health care provider. Document Released: 08/25/2009 Document Revised: 04/05/2016 Document Reviewed: 12/03/2014 Elsevier Interactive Patient Education  2017 [employer] Inc[employer]  Influenza (Flu) Vaccine (Inactivated or Recombinant): What You Need to Know 1. Why get vaccinated? Influenza ("flu") is a contagious disease that spreads around the [place] every year, usually between October and May. Flu is caused by influenza viruses, and is spread mainly by coughing, sneezing, and close contact. Anyone can get flu. Flu strikes suddenly and can last several days. Symptoms vary by age, but can  include:  fever/chills  sore throat  muscle aches  fatigue  cough  headache  runny or stuffy nose  Flu can also lead to pneumonia and blood infections, and cause diarrhea and seizures in children. If you have a medical condition, such as heart or lung disease, flu can make it worse. Flu is more dangerous for some people. Infants and young children, people 81 years of  age and older, pregnant women, and people with certain health conditions or a weakened immune system are at greatest risk. Each year thousands of people in the [place] die from flu, and many more are hospitalized. Flu vaccine can:  keep you from getting flu,  make flu less severe if you do get it, and  keep you from spreading flu to your family and other people. 2. Inactivated and recombinant flu vaccines A dose of flu vaccine is recommended every flu season. Children 6 months through 52 years of age may need two doses during the same flu season. Everyone else needs only one dose each flu season. Some inactivated flu vaccines contain a very small amount of a mercury-based preservative called thimerosal. Studies have not shown thimerosal in vaccines to be harmful, but flu vaccines that do not contain thimerosal are available. There is no live flu virus in flu shots. They cannot cause the flu. There are many flu viruses, and they are always changing. Each year a new flu vaccine is made to protect against three or four viruses that are likely to cause disease in the upcoming flu season. But even when the vaccine doesn't exactly match these viruses, it may still provide some protection. Flu vaccine cannot prevent:  flu that is caused by a virus not covered by the vaccine, or  illnesses that look like flu but are not.  It takes about 2 weeks for protection to develop after vaccination, and protection lasts through the flu season. 3. Some people should not get this vaccine Tell the person who is giving you the  vaccine:  If you have any severe, life-threatening allergies. If you ever had a life-threatening allergic reaction after a dose of flu vaccine, or have a severe allergy to any part of this vaccine, you may be advised not to get vaccinated. Most, but not all, types of flu vaccine contain a small amount of egg protein.  If you ever had Guillain-Barr Syndrome (also called GBS). Some people with a history of GBS should not get this vaccine. This should be discussed with your doctor.  If you are not feeling well. It is usually okay to get flu vaccine when you have a mild illness, but you might be asked to come back when you feel better.  4. Risks of a vaccine reaction With any medicine, including vaccines, there is a chance of reactions. These are usually mild and go away on their own, but serious reactions are also possible. Most people who get a flu shot do not have any problems with it. Minor problems following a flu shot include:  soreness, redness, or swelling where the shot was given  hoarseness  sore, red or itchy eyes  cough  fever  aches  headache  itching  fatigue  If these problems occur, they usually begin soon after the shot and last 1 or 2 days. More serious problems following a flu shot can include the following:  There may be a small increased risk of Guillain-Barre Syndrome (GBS) after inactivated flu vaccine. This risk has been estimated at 1 or 2 additional cases per million people vaccinated. This is much lower than the risk of severe complications from flu, which can be prevented by flu vaccine.  Young children who get the flu shot along with pneumococcal vaccine (PCV13) and/or DTaP vaccine at the same time might be slightly more likely to have a seizure caused by fever. Ask your doctor for more information. Tell your doctor if  a child who is getting flu vaccine has ever had a seizure.  Problems that could happen after any injected vaccine:  People sometimes  faint after a medical procedure, including vaccination. Sitting or lying down for about 15 minutes can help prevent fainting, and injuries caused by a fall. Tell your doctor if you feel dizzy, or have vision changes or ringing in the ears.  Some people get severe pain in the shoulder and have difficulty moving the arm where a shot was given. This happens very rarely.  Any medication can cause a severe allergic reaction. Such reactions from a vaccine are very rare, estimated at about 1 in a million doses, and would happen within a few minutes to a few hours after the vaccination. As with any medicine, there is a very remote chance of a vaccine causing a serious injury or death. The safety of vaccines is always being monitored. For more information, visit: http://floyd.org/www.cdc.gov/vaccinesafety/ 5. What if there is a serious reaction? What should I look for? Look for anything that concerns you, such as signs of a severe allergic reaction, very high fever, or unusual behavior. Signs of a severe allergic reaction can include hives, swelling of the face and throat, difficulty breathing, a fast heartbeat, dizziness, and weakness. These would start a few minutes to a few hours after the vaccination. What should I do?  If you think it is a severe allergic reaction or other emergency that can't wait, call 9-1-1 and get the person to the nearest hospital. Otherwise, call your doctor.  Reactions should be reported to the Vaccine Adverse Event Reporting System (VAERS). Your doctor should file this report, or you can do it yourself through the VAERS web site at www.vaers.LAgents.nohhs.gov, or by calling 1-925-498-5346. ? VAERS does not give medical advice. 6. The National Vaccine Injury Compensation Program The [employer] Vaccine Injury Compensation Program (VICP) is a federal program that was created to compensate people who may have been injured by certain vaccines. Persons who believe they may have been injured by a vaccine can  learn about the program and about filing a claim by calling 1-610-524-2496 or visiting the VICP website at SpiritualWord.atwww.hrsa.gov/vaccinecompensation. There is a time limit to file a claim for compensation. 7. How can I learn more?  Ask your healthcare provider. He or she can give you the vaccine package insert or suggest other sources of information.  Call your local or state health department.  Contact the Centers for Disease Control and Prevention (CDC): ? Call (450)832-17921-(769) 777-6524 (1-800-CDC-INFO) or ? Visit CDC's website at BiotechRoom.com.cywww.cdc.gov/flu Vaccine Information Statement, Inactivated Influenza Vaccine (06/18/2014) This information is not intended to replace advice given to you by your health care provider. Make sure you discuss any questions you have with your health care provider. Document Released: 08/23/2006 Document Revised: 07/19/2016 Document Reviewed: 07/19/2016 Elsevier Interactive Patient Education  2017 [employer]

## 2017-08-20 ENCOUNTER — Ambulatory Visit (INDEPENDENT_AMBULATORY_CARE_PROVIDER_SITE_OTHER): Payer: Medicare HMO | Admitting: Family Medicine

## 2017-08-20 ENCOUNTER — Encounter: Payer: Self-pay | Admitting: Family Medicine

## 2017-08-20 VITALS — BP 124/70 | HR 97 | Wt 106.0 lb

## 2017-08-20 DIAGNOSIS — I10 Essential (primary) hypertension: Secondary | ICD-10-CM | POA: Diagnosis not present

## 2017-08-20 NOTE — Progress Notes (Signed)
BP 124/70   Pulse 97   Wt 106 lb (48.1 kg)   LMP  (LMP Unknown)   SpO2 92%   BMI 22.15 kg/m    Subjective:    Patient ID: Rhonda Ingram, female    DOB: 02/28/1926, 83 y.o.   MRN: [id number]  HPI: Rhonda Ingram is a 83 y.o. female  Chief Complaint  Patient presents with  . Follow-up  Patient follow-up blood pressure doing well with blood pressure and good blood pressure readings. No complaints from medications. Still takes an occasional BC 4 bad headaches but that's less than once a month. Otherwise doing well a lot of stress at home going on.  Relevant past medical, surgical, family and social history reviewed and updated as indicated. Interim medical history since our last visit reviewed. Allergies and medications reviewed and updated.  Review of Systems  Constitutional: Negative.   Respiratory: Negative.   Cardiovascular: Negative.     Per HPI unless specifically indicated above     Objective:    BP 124/70   Pulse 97   Wt 106 lb (48.1 kg)   LMP  (LMP Unknown)   SpO2 92%   BMI 22.15 kg/m   Wt Readings from Last 3 Encounters:  08/20/17 106 lb (48.1 kg)  07/24/17 115 lb 6.4 oz (52.3 kg)  02/14/17 114 lb 12.8 oz (52.1 kg)    Physical Exam  Constitutional: She is oriented to person, place, and time. She appears well-developed and well-nourished.  HENT:  Head: Normocephalic and atraumatic.  Eyes: Conjunctivae and EOM are normal.  Neck: Normal range of motion.  Cardiovascular: Normal rate, regular rhythm and normal heart sounds.   Pulmonary/Chest: Effort normal and breath sounds normal.  Musculoskeletal: Normal range of motion.  Neurological: She is alert and oriented to person, place, and time.  Skin: No erythema.  Psychiatric: She has a normal mood and affect. Her behavior is normal. Judgment and thought content normal.    Results for orders placed or performed in visit on 02/14/17  Microscopic Examination  Result Value Ref Range   WBC, UA 0-5 0 -  5 /hpf   RBC, UA None seen 0 - 2 /hpf   Epithelial Cells (non renal) 0-10 0 - 10 /hpf   Bacteria, UA None seen None seen/Few  CBC with Differential/Platelet  Result Value Ref Range   WBC 5.5 3.4 - 10.8 x10E3/uL   RBC 4.39 3.77 - 5.28 x10E6/uL   Hemoglobin 12.3 11.1 - 15.9 g/dL   Hematocrit 40.9 81.1 - 46.6 %   MCV 88 79 - 97 fL   MCH 28.0 26.6 - 33.0 pg   MCHC 31.9 31.5 - 35.7 g/dL   RDW 91.4 78.2 - 95.6 %   Platelets 251 150 - 379 x10E3/uL   Neutrophils 52 Not Estab. %   Lymphs 32 Not Estab. %   Monocytes 12 Not Estab. %   Eos 3 Not Estab. %   Basos 1 Not Estab. %   Neutrophils Absolute 2.8 1.4 - 7.0 x10E3/uL   Lymphocytes Absolute 1.8 0.7 - 3.1 x10E3/uL   Monocytes Absolute 0.7 0.1 - 0.9 x10E3/uL   EOS (ABSOLUTE) 0.2 0.0 - 0.4 x10E3/uL   Basophils Absolute 0.0 0.0 - 0.2 x10E3/uL   Immature Granulocytes 0 Not Estab. %   Immature Grans (Abs) 0.0 0.0 - 0.1 x10E3/uL  Comprehensive metabolic panel  Result Value Ref Range   Glucose 108 (H) 65 - 99 mg/dL   BUN 13 8 - 27  mg/dL   Creatinine, Ser 1.61 0.57 - 1.00 mg/dL   GFR calc non Af Amer 76 >59 mL/min/1.73   GFR calc Af Amer 87 >59 mL/min/1.73   BUN/Creatinine Ratio 18 12 - 28   Sodium 139 134 - 144 mmol/L   Potassium 4.1 3.5 - 5.2 mmol/L   Chloride 101 96 - 106 mmol/L   CO2 25 18 - 29 mmol/L   Calcium 9.1 8.7 - 10.3 mg/dL   Total Protein 7.0 6.0 - 8.5 g/dL   Albumin 3.6 3.5 - 4.7 g/dL   Globulin, Total 3.4 1.5 - 4.5 g/dL   Albumin/Globulin Ratio 1.1 (L) 1.2 - 2.2   Bilirubin Total <0.2 0.0 - 1.2 mg/dL   Alkaline Phosphatase 56 39 - 117 IU/L   AST 16 0 - 40 IU/L   ALT 7 0 - 32 IU/L  Lipid panel  Result Value Ref Range   Cholesterol, Total 230 (H) 100 - 199 mg/dL   Triglycerides 096 (H) 0 - 149 mg/dL   HDL 63 >04 mg/dL   VLDL Cholesterol Cal 38 5 - 40 mg/dL   LDL Calculated 540 (H) 0 - 99 mg/dL   Chol/HDL Ratio 3.7 0.0 - 4.4 ratio  TSH  Result Value Ref Range   TSH 2.380 0.450 - 4.500 uIU/mL  Urinalysis, Routine  w reflex microscopic  Result Value Ref Range   Specific Gravity, UA 1.010 1.005 - 1.030   pH, UA 5.5 5.0 - 7.5   Color, UA Yellow Yellow   Appearance Ur Clear Clear   Leukocytes, UA 2+ (A) Negative   Protein, UA Negative Negative/Trace   Glucose, UA Negative Negative   Ketones, UA Negative Negative   RBC, UA 1+ (A) Negative   Bilirubin, UA Negative Negative   Urobilinogen, Ur 0.2 0.2 - 1.0 mg/dL   Nitrite, UA Negative Negative   Microscopic Examination See below:       Assessment & Plan:   Problem List Items Addressed This Visit      Cardiovascular and Mediastinum   Hypertension - Primary    The current medical regimen is effective;  continue present plan and medications.         Discussed with patient review of renal function which is been okay patient can go ahead and use an occasional BC as that helps for headache the most.  Follow up plan: No Follow-up on file.

## 2017-08-20 NOTE — Assessment & Plan Note (Signed)
The current medical regimen is effective;  continue present plan and medications.  

## 2017-08-21 ENCOUNTER — Encounter: Payer: Self-pay | Admitting: Family Medicine

## 2017-08-21 LAB — BASIC METABOLIC PANEL
BUN/Creatinine Ratio: 25 (ref 12–28)
BUN: 20 mg/dL (ref 8–27)
CO2: 24 mmol/L (ref 20–29)
Calcium: 9.6 mg/dL (ref 8.7–10.3)
Chloride: 101 mmol/L (ref 96–106)
Creatinine, Ser: 0.8 mg/dL (ref 0.57–1.00)
GFR calc Af Amer: 76 mL/min/{1.73_m2} (ref >59)
GFR calc non Af Amer: 66 mL/min/{1.73_m2} (ref >59)
Glucose: 146 mg/dL — ABNORMAL HIGH (ref 65–99)
Potassium: 4.1 mmol/L (ref 3.5–5.2)
Sodium: 140 mmol/L (ref 134–144)

## 2017-12-24 ENCOUNTER — Encounter: Payer: Self-pay | Admitting: Family Medicine

## 2018-02-24 ENCOUNTER — Encounter: Payer: Medicare HMO | Admitting: Family Medicine

## 2018-03-18 ENCOUNTER — Ambulatory Visit (INDEPENDENT_AMBULATORY_CARE_PROVIDER_SITE_OTHER): Payer: Medicare HMO | Admitting: Family Medicine

## 2018-03-18 ENCOUNTER — Encounter: Payer: Self-pay | Admitting: Family Medicine

## 2018-03-18 VITALS — BP 154/77 | HR 66 | Ht 60.0 in | Wt 117.0 lb

## 2018-03-18 DIAGNOSIS — E785 Hyperlipidemia, unspecified: Secondary | ICD-10-CM

## 2018-03-18 DIAGNOSIS — K439 Ventral hernia without obstruction or gangrene: Secondary | ICD-10-CM | POA: Diagnosis not present

## 2018-03-18 DIAGNOSIS — E782 Mixed hyperlipidemia: Secondary | ICD-10-CM

## 2018-03-18 DIAGNOSIS — Z1329 Encounter for screening for other suspected endocrine disorder: Secondary | ICD-10-CM | POA: Diagnosis not present

## 2018-03-18 DIAGNOSIS — Z0000 Encounter for general adult medical examination without abnormal findings: Secondary | ICD-10-CM | POA: Diagnosis not present

## 2018-03-18 DIAGNOSIS — I10 Essential (primary) hypertension: Secondary | ICD-10-CM

## 2018-03-18 DIAGNOSIS — Z7189 Other specified counseling: Secondary | ICD-10-CM | POA: Diagnosis not present

## 2018-03-18 DIAGNOSIS — T7840XA Allergy, unspecified, initial encounter: Secondary | ICD-10-CM

## 2018-03-18 LAB — URINALYSIS, ROUTINE W REFLEX MICROSCOPIC
Bilirubin, UA: NEGATIVE
Glucose, UA: NEGATIVE
Ketones, UA: NEGATIVE
Nitrite, UA: NEGATIVE
Protein, UA: NEGATIVE
Specific Gravity, UA: 1.02 (ref 1.005–1.030)
Urobilinogen, Ur: 0.2 mg/dL (ref 0.2–1.0)
pH, UA: 5 (ref 5.0–7.5)

## 2018-03-18 LAB — MICROSCOPIC EXAMINATION

## 2018-03-18 MED ORDER — BENAZEPRIL HCL 5 MG PO TABS: 5.0000 mg | ORAL_TABLET | Freq: Every day | ORAL | 4 refills | Status: DC

## 2018-03-18 NOTE — Assessment & Plan Note (Signed)
Stable off medications.  

## 2018-03-18 NOTE — Assessment & Plan Note (Signed)
A voluntary discussion about advance care planning including the explanation and discussion of advance directives was extensively discussed  with the patient.  Explanation about the health care proxy and Living will was reviewed and packet with forms with explanation of how to fill them out was given.    Time spent: 16+ min       Individuals present: Pt and Granddaughter.

## 2018-03-18 NOTE — Assessment & Plan Note (Signed)
All resolved

## 2018-03-18 NOTE — Progress Notes (Signed)
BP (!) 154/77   Pulse 66   Ht 5' (1.524 m)   Wt 117 lb (53.1 kg)   LMP  (LMP Unknown)   SpO2 98%   BMI 22.85 kg/m    Subjective:    Patient ID: Rhonda Ingram, female    DOB: 1926-01-23, 84 y.o.   MRN: [id number]  HPI: Rhonda Ingram is a 84 y.o. female  Chief Complaint  Patient presents with  . Annual Exam   Patient with occasional dizzy spells especially when going from sitting to standing if that comes on patient just sits there a little bit longer and then is able to go and do without problems. Blood pressure also otherwise doing well.  Taking Benzapril 5 mg. Takes an occasional Tylenol PM is not really hung over the next day uses occasional Flonase for allergies. Triamcinolone is used up and does not need anymore.  Relevant past medical, surgical, family and social history reviewed and updated as indicated. Interim medical history since our last visit reviewed. Allergies and medications reviewed and updated.  Review of Systems  Constitutional: Negative.   HENT: Negative.   Eyes: Negative.   Respiratory: Negative.   Cardiovascular: Negative.   Gastrointestinal: Negative.   Endocrine: Negative.   Genitourinary: Negative.   Musculoskeletal: Negative.   Skin: Negative.   Allergic/Immunologic: Negative.   Neurological: Negative.   Hematological: Negative.   Psychiatric/Behavioral: Negative.     Per HPI unless specifically indicated above     Objective:    BP (!) 154/77   Pulse 66   Ht 5' (1.524 m)   Wt 117 lb (53.1 kg)   LMP  (LMP Unknown)   SpO2 98%   BMI 22.85 kg/m   Wt Readings from Last 3 Encounters:  03/18/18 117 lb (53.1 kg)  08/20/17 106 lb (48.1 kg)  07/24/17 115 lb 6.4 oz (52.3 kg)    Physical Exam  Constitutional: She is oriented to person, place, and time. She appears well-developed and well-nourished.  HENT:  Head: Normocephalic and atraumatic.  Right Ear: External ear normal.  Left Ear: External ear normal.  Nose: Nose normal.    Mouth/Throat: Oropharynx is clear and moist.  Eyes: Pupils are equal, round, and reactive to light. Conjunctivae and EOM are normal.  Neck: Normal range of motion. Neck supple. Carotid bruit is not present.  Cardiovascular: Normal rate, regular rhythm and normal heart sounds.  No murmur heard. Pulmonary/Chest: Effort normal and breath sounds normal. She exhibits no mass. Right breast exhibits no mass, no skin change and no tenderness. Left breast exhibits no mass, no skin change and no tenderness. Breasts are symmetrical.  Abdominal: Soft. Bowel sounds are normal. There is no hepatosplenomegaly.  Musculoskeletal: Normal range of motion.  Neurological: She is alert and oriented to person, place, and time.  Skin: No rash noted.  Psychiatric: She has a normal mood and affect. Her behavior is normal. Judgment and thought content normal.    Results for orders placed or performed in visit on 08/20/17  Basic metabolic panel  Result Value Ref Range   Glucose 146 (H) 65 - 99 mg/dL   BUN 20 8 - 27 mg/dL   Creatinine, Ser 4.09 0.57 - 1.00 mg/dL   GFR calc non Af Amer 66 >59 mL/min/1.73   GFR calc Af Amer 76 >59 mL/min/1.73   BUN/Creatinine Ratio 25 12 - 28   Sodium 140 134 - 144 mmol/L   Potassium 4.1 3.5 - 5.2 mmol/L   Chloride 101  96 - 106 mmol/L   CO2 24 20 - 29 mmol/L   Calcium 9.6 8.7 - 10.3 mg/dL      Assessment & Plan:   Problem List Items Addressed This Visit      Cardiovascular and Mediastinum   Hypertension - Primary    Discussed blood pressure up a little bit but do not want to low because of fall risk will not adjust medications.      Relevant Medications   benazepril (LOTENSIN) 5 MG tablet   Other Relevant Orders   CBC with Differential/Platelet   Comprehensive metabolic panel   Lipid panel   TSH   Urinalysis, Routine w reflex microscopic     Other   Hyperlipidemia    Stable off medications      Relevant Medications   benazepril (LOTENSIN) 5 MG tablet   Other  Relevant Orders   CBC with Differential/Platelet   Comprehensive metabolic panel   Lipid panel   TSH   Urinalysis, Routine w reflex microscopic   CBC with Differential/Platelet   Comprehensive metabolic panel   Lipid panel   TSH   Urinalysis, Routine w reflex microscopic   Allergic reaction    All resolved      Ventral hernia    No sx for now      Advanced care planning/counseling discussion    A voluntary discussion about advance care planning including the explanation and discussion of advance directives was extensively discussed  with the patient.  Explanation about the health care proxy and Living will was reviewed and packet with forms with explanation of how to fill them out was given.    Time spent: 16+ min       Individuals present: Pt and Granddaughter.        Other Visit Diagnoses    Thyroid disorder screen       Relevant Orders   CBC with Differential/Platelet   Comprehensive metabolic panel   Lipid panel   TSH   Urinalysis, Routine w reflex microscopic   Annual physical exam       Relevant Orders   CBC with Differential/Platelet   Comprehensive metabolic panel   Lipid panel   TSH   Urinalysis, Routine w reflex microscopic       Follow up plan: Return in about 6 months (around 09/18/2018) for BP check.

## 2018-03-18 NOTE — Assessment & Plan Note (Signed)
No sx for now

## 2018-03-18 NOTE — Assessment & Plan Note (Signed)
Discussed blood pressure up a little bit but do not want to low because of fall risk will not adjust medications.

## 2018-03-19 ENCOUNTER — Encounter: Payer: Self-pay | Admitting: Family Medicine

## 2018-03-19 LAB — COMPREHENSIVE METABOLIC PANEL
ALT: 7 IU/L (ref 0–32)
AST: 22 IU/L (ref 0–40)
Albumin/Globulin Ratio: 1.1 — ABNORMAL LOW (ref 1.2–2.2)
Albumin: 3.7 g/dL (ref 3.2–4.6)
Alkaline Phosphatase: 64 IU/L (ref 39–117)
BUN/Creatinine Ratio: 19 (ref 12–28)
BUN: 13 mg/dL (ref 10–36)
Bilirubin Total: <0.2 mg/dL (ref 0.0–1.2)
CO2: 21 mmol/L (ref 20–29)
Calcium: 9 mg/dL (ref 8.7–10.3)
Chloride: 105 mmol/L (ref 96–106)
Creatinine, Ser: 0.69 mg/dL (ref 0.57–1.00)
GFR calc Af Amer: 89 mL/min/{1.73_m2} (ref >59)
GFR calc non Af Amer: 77 mL/min/{1.73_m2} (ref >59)
Globulin, Total: 3.4 g/dL (ref 1.5–4.5)
Glucose: 120 mg/dL — ABNORMAL HIGH (ref 65–99)
Potassium: 4.2 mmol/L (ref 3.5–5.2)
Sodium: 142 mmol/L (ref 134–144)
Total Protein: 7.1 g/dL (ref 6.0–8.5)

## 2018-03-19 LAB — LIPID PANEL
Chol/HDL Ratio: 4.1 ratio (ref 0.0–4.4)
Cholesterol, Total: 225 mg/dL — ABNORMAL HIGH (ref 100–199)
HDL: 55 mg/dL (ref >39)
LDL Calculated: 110 mg/dL — ABNORMAL HIGH (ref 0–99)
Triglycerides: 299 mg/dL — ABNORMAL HIGH (ref 0–149)
VLDL Cholesterol Cal: 60 mg/dL — ABNORMAL HIGH (ref 5–40)

## 2018-03-19 LAB — CBC WITH DIFFERENTIAL/PLATELET
Basophils Absolute: 0.1 10*3/uL (ref 0.0–0.2)
Basos: 2 %
EOS (ABSOLUTE): 0.3 10*3/uL (ref 0.0–0.4)
Eos: 5 %
Hematocrit: 38.3 % (ref 34.0–46.6)
Hemoglobin: 12.6 g/dL (ref 11.1–15.9)
Immature Grans (Abs): 0 10*3/uL (ref 0.0–0.1)
Immature Granulocytes: 0 %
Lymphocytes Absolute: 1.8 10*3/uL (ref 0.7–3.1)
Lymphs: 33 %
MCH: 28.5 pg (ref 26.6–33.0)
MCHC: 32.9 g/dL (ref 31.5–35.7)
MCV: 87 fL (ref 79–97)
Monocytes Absolute: 0.5 10*3/uL (ref 0.1–0.9)
Monocytes: 9 %
Neutrophils Absolute: 2.9 10*3/uL (ref 1.4–7.0)
Neutrophils: 51 %
Platelets: 222 10*3/uL (ref 150–379)
RBC: 4.42 x10E6/uL (ref 3.77–5.28)
RDW: 13.9 % (ref 12.3–15.4)
WBC: 5.5 10*3/uL (ref 3.4–10.8)

## 2018-03-19 LAB — TSH: TSH: 2.2 u[IU]/mL (ref 0.450–4.500)

## 2018-04-18 ENCOUNTER — Ambulatory Visit: Payer: Self-pay

## 2018-04-18 NOTE — Telephone Encounter (Signed)
Telephone call form patient  Stating that she noticed  pink on the toilet paper after a bowel movement.  Patient states it was about a table spoon full on the paper.  States this was the first time since 3 to 4 years ago. Stool was soft. Denies taking blood thinners. Only takes Lisinopril 5 mg Positive for gas.  Pt, scheduled for   Appointment  June 13th. 2019. Voiced understanding.   Reason for Disposition . MILD rectal bleeding (more than just a few drops or streaks)  Answer Assessment - Initial Assessment Questions 1. APPEARANCE of BLOOD: "What color is it?" "Is it passed separately, on the surface of the stool, or mixed in with the stool?"     " Bright red to a hot pink".  separately 2. AMOUNT: "How much blood was passed?"      ? Table spoon 3. FREQUENCY: "How many times has blood been passed with the stools?"     First time 4. ONSET: "When was the blood first seen in the stools?" (Days or weeks)      today 5. DIARRHEA: "Is there also some diarrhea?" If so, ask: "How many diarrhea stools were passed in past 24 hours?"       soft 6. CONSTIPATION: "Do you have constipation?" If so, "How bad is it?"    no 7. RECURRENT SYMPTOMS: "Have you had blood in your stools before?" If so, ask: "When was the last time?" and "What happened that time?"      3 to 4 years ago 8. BLOOD THINNERS: "Do you take any blood thinners?" (e.g., Coumadin/warfarin, Pradaxa/dabigatran, aspirin)    No  Only high blood pressure medication Lisinopril 5mg  9. OTHER SYMPTOMS: "Do you have any other symptoms?"  (e.g., abdominal pain, vomiting, dizziness, fever)  Gas  Protocols used: RECTAL BLEEDING-A-AH

## 2018-04-24 ENCOUNTER — Ambulatory Visit (INDEPENDENT_AMBULATORY_CARE_PROVIDER_SITE_OTHER): Payer: Medicare HMO | Admitting: Physician Assistant

## 2018-04-24 ENCOUNTER — Encounter: Payer: Self-pay | Admitting: Physician Assistant

## 2018-04-24 VITALS — BP 136/80 | HR 80 | Temp 99.5°F | Wt 117.3 lb

## 2018-04-24 DIAGNOSIS — K625 Hemorrhage of anus and rectum: Secondary | ICD-10-CM | POA: Diagnosis not present

## 2018-04-24 NOTE — Patient Instructions (Signed)
Rectal Bleeding °Rectal bleeding is when blood comes out of the opening of the butt (anus). People with this kind of bleeding may notice bright red blood in their underwear or in the toilet after they poop (have a bowel movement). They may also have dark red or black poop (stool). Rectal bleeding is often a sign that something is wrong. It needs to be checked by a doctor. °Follow these instructions at home: °Watch for any changes in your condition. Take these actions to help with bleeding and discomfort: °· Eat a diet that is high in fiber. This will keep your poop soft so it is easier for you to poop without pushing too hard. Ask your doctor to tell you what foods and drinks are high in fiber. °· Drink enough fluid to keep your pee (urine) clear or pale yellow. This also helps keep your poop soft. °· Try taking a warm bath. This may help with pain. °· Keep all follow-up visits as told by your doctor. This is important. ° °Get help right away if: °· You have new bleeding. °· You have more bleeding than before. °· You have black or dark red poop. °· You throw up (vomit) blood or something that looks like coffee grounds. °· You have pain or tenderness in your belly (abdomen). °· You have a fever. °· You feel weak. °· You feel sick to your stomach (nauseous). °· You pass out (faint). °· You have very bad pain in your butt. °· You cannot poop. °This information is not intended to replace advice given to you by your health care provider. Make sure you discuss any questions you have with your health care provider. °Document Released: 07/11/2011 Document Revised: 04/05/2016 Document Reviewed: 12/25/2015 °Elsevier Interactive Patient Education © 2018 [employer] ° °

## 2018-04-24 NOTE — Progress Notes (Signed)
Subjective:    Patient ID: Rhonda Ingram, female    DOB: 03/20/1926, 84 y.o.   MRN: [id number]  Rhonda Ingram is a 84 y.o. female presenting on 04/24/2018 for Rectal Bleeding   HPI   Past medical history of polyps, unsure which type. Reports she had a colonoscopy with Dr. Skeet Simmeriftikar 3-4 years ago that she thinks was normal. She had an episode over the weekend where she noticed redness in the toilet and on the tissue. The night before she had some red berries. She denies abdominal pain, rectal pain, constipation, anal itching. She denies fevers, chills, and weight loss. She hasn't had a change in consistency of bowel movements, but has had change in amounts. She has had prior infrequent episodes    Social History   Tobacco Use  . Smoking status: Never Smoker  . Smokeless tobacco: Never Used  Substance Use Topics  . Alcohol use: No  . Drug use: No    Review of Systems Per HPI unless specifically indicated above     Objective:    BP 136/80 (BP Location: Left Arm, Patient Position: Sitting, Cuff Size: Normal)   Pulse 80   Temp 99.5 F (37.5 C) (Tympanic)   Wt 117 lb 4.8 oz (53.2 kg)   LMP  (LMP Unknown)   SpO2 100%   BMI 22.91 kg/m   Wt Readings from Last 3 Encounters:  04/24/18 117 lb 4.8 oz (53.2 kg)  03/18/18 117 lb (53.1 kg)  08/20/17 106 lb (48.1 kg)    Physical Exam  Constitutional: She is oriented to person, place, and time. She appears well-developed and well-nourished.  Genitourinary: Rectal exam shows guaiac positive stool. Rectal exam shows no external hemorrhoid, no fissure, no mass, no tenderness and anal tone normal.  Neurological: She is alert and oriented to person, place, and time.  Skin: Skin is warm and dry.  Psychiatric: She has a normal mood and affect. Her behavior is normal.   Results for orders placed or performed in visit on 03/18/18  Microscopic Examination  Result Value Ref Range   WBC, UA 6-10 (A) 0 - 5 /hpf   RBC, UA 0-2 0 - 2 /hpf   Epithelial Cells (non renal) 0-10 0 - 10 /hpf   Renal Epithel, UA 0-10 (A) None seen /hpf   Bacteria, UA Few None seen/Few  CBC with Differential/Platelet  Result Value Ref Range   WBC 5.5 3.4 - 10.8 x10E3/uL   RBC 4.42 3.77 - 5.28 x10E6/uL   Hemoglobin 12.6 11.1 - 15.9 g/dL   Hematocrit 78.238.3 95.634.0 - 46.6 %   MCV 87 79 - 97 fL   MCH 28.5 26.6 - 33.0 pg   MCHC 32.9 31.5 - 35.7 g/dL   RDW 21.313.9 08.612.3 - 57.815.4 %   Platelets 222 150 - 379 x10E3/uL   Neutrophils 51 Not Estab. %   Lymphs 33 Not Estab. %   Monocytes 9 Not Estab. %   Eos 5 Not Estab. %   Basos 2 Not Estab. %   Neutrophils Absolute 2.9 1.4 - 7.0 x10E3/uL   Lymphocytes Absolute 1.8 0.7 - 3.1 x10E3/uL   Monocytes Absolute 0.5 0.1 - 0.9 x10E3/uL   EOS (ABSOLUTE) 0.3 0.0 - 0.4 x10E3/uL   Basophils Absolute 0.1 0.0 - 0.2 x10E3/uL   Immature Granulocytes 0 Not Estab. %   Immature Grans (Abs) 0.0 0.0 - 0.1 x10E3/uL  Comprehensive metabolic panel  Result Value Ref Range   Glucose 120 (H) 65 - 99  mg/dL   BUN 13 10 - 36 mg/dL   Creatinine, Ser 1.61 0.57 - 1.00 mg/dL   GFR calc non Af Amer 77 >59 mL/min/1.73   GFR calc Af Amer 89 >59 mL/min/1.73   BUN/Creatinine Ratio 19 12 - 28   Sodium 142 134 - 144 mmol/L   Potassium 4.2 3.5 - 5.2 mmol/L   Chloride 105 96 - 106 mmol/L   CO2 21 20 - 29 mmol/L   Calcium 9.0 8.7 - 10.3 mg/dL   Total Protein 7.1 6.0 - 8.5 g/dL   Albumin 3.7 3.2 - 4.6 g/dL   Globulin, Total 3.4 1.5 - 4.5 g/dL   Albumin/Globulin Ratio 1.1 (L) 1.2 - 2.2   Bilirubin Total <0.2 0.0 - 1.2 mg/dL   Alkaline Phosphatase 64 39 - 117 IU/L   AST 22 0 - 40 IU/L   ALT 7 0 - 32 IU/L  Lipid panel  Result Value Ref Range   Cholesterol, Total 225 (H) 100 - 199 mg/dL   Triglycerides 096 (H) 0 - 149 mg/dL   HDL 55 >04 mg/dL   VLDL Cholesterol Cal 60 (H) 5 - 40 mg/dL   LDL Calculated 540 (H) 0 - 99 mg/dL   Chol/HDL Ratio 4.1 0.0 - 4.4 ratio  TSH  Result Value Ref Range   TSH 2.200 0.450 - 4.500 uIU/mL  Urinalysis, Routine  w reflex microscopic  Result Value Ref Range   Specific Gravity, UA 1.020 1.005 - 1.030   pH, UA 5.0 5.0 - 7.5   Color, UA Yellow Yellow   Appearance Ur Cloudy (A) Clear   Leukocytes, UA 2+ (A) Negative   Protein, UA Negative Negative/Trace   Glucose, UA Negative Negative   Ketones, UA Negative Negative   RBC, UA 2+ (A) Negative   Bilirubin, UA Negative Negative   Urobilinogen, Ur 0.2 0.2 - 1.0 mg/dL   Nitrite, UA Negative Negative   Microscopic Examination See below:       Assessment & Plan:   1. Rectal bleeding  Exam benign, no hemorrhoids noted. Hemoccult positive for occult blood. Will obtain patient's colonoscopy records. Patient has infrequent, sporadic episodes of BRB in toilet tissue. May represent small anal tear or irritation from tissue, could possibly be malignancy but she reports she had a normal colonoscopy 3-4 years ago. Do think her most recent episode is from the berries. Will work on getting these colonoscopy records, ROI signed and will be faxed to Dr. Margarito Liner office. She does not want to see GI just yet and plans to review this with Dr. Dossie Arbour at her next visit in November.    Follow up plan: Return if symptoms worsen or fail to improve.  Osvaldo Angst, PA-C [facility] 04/24/2018, 3:07 PM

## 2018-04-30 LAB — POC HEMOCCULT BLD/STL (OFFICE/1-CARD/DIAGNOSTIC): Fecal Occult Blood, POC: POSITIVE — AB

## 2018-05-08 ENCOUNTER — Encounter: Payer: Self-pay | Admitting: Family Medicine

## 2018-07-02 DIAGNOSIS — H903 Sensorineural hearing loss, bilateral: Secondary | ICD-10-CM | POA: Diagnosis not present

## 2018-07-03 ENCOUNTER — Emergency Department
Admission: EM | Admit: 2018-07-03 | Discharge: 2018-07-03 | Disposition: A | Discharge status: Home / Self Care | Payer: Medicare HMO | Attending: Emergency Medicine | Admitting: Emergency Medicine

## 2018-07-03 ENCOUNTER — Other Ambulatory Visit: Payer: Self-pay

## 2018-07-03 ENCOUNTER — Emergency Department: Payer: Medicare HMO

## 2018-07-03 DIAGNOSIS — R079 Chest pain, unspecified: Secondary | ICD-10-CM | POA: Diagnosis not present

## 2018-07-03 DIAGNOSIS — I10 Essential (primary) hypertension: Secondary | ICD-10-CM | POA: Diagnosis not present

## 2018-07-03 DIAGNOSIS — S299XXA Unspecified injury of thorax, initial encounter: Secondary | ICD-10-CM | POA: Diagnosis not present

## 2018-07-03 DIAGNOSIS — R51 Headache: Secondary | ICD-10-CM | POA: Insufficient documentation

## 2018-07-03 DIAGNOSIS — Z79899 Other long term (current) drug therapy: Secondary | ICD-10-CM | POA: Insufficient documentation

## 2018-07-03 DIAGNOSIS — S0990XA Unspecified injury of head, initial encounter: Secondary | ICD-10-CM | POA: Diagnosis not present

## 2018-07-03 DIAGNOSIS — N644 Mastodynia: Secondary | ICD-10-CM

## 2018-07-03 DIAGNOSIS — R519 Headache, unspecified: Secondary | ICD-10-CM

## 2018-07-03 MED ORDER — ACETAMINOPHEN 325 MG PO TABS
650.0000 mg | ORAL_TABLET | Freq: Once | ORAL | Status: AC
  Administered 2018-07-03: 650 mg via ORAL
  Filled 2018-07-03: qty 2

## 2018-07-03 NOTE — ED Notes (Signed)
MD at bedside. 

## 2018-07-03 NOTE — ED Notes (Signed)
Pt gone to CT 
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
## 2018-07-03 NOTE — ED Notes (Signed)
Pt assisted to toilet. Pt ambulated without difficulty. 

## 2018-07-03 NOTE — Discharge Instructions (Addendum)
You may take Tylenol or Motrin for your pain, and use heat packs as needed to decrease the pain in your chest.  Return to the emergency department if you develop severe pain, vomiting, changes in mental status, shortness of breath, or any other symptoms concerning to you.
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
## 2018-07-09 ENCOUNTER — Ambulatory Visit (INDEPENDENT_AMBULATORY_CARE_PROVIDER_SITE_OTHER): Payer: Medicare HMO | Admitting: Family Medicine

## 2018-07-09 ENCOUNTER — Encounter: Payer: Self-pay | Admitting: Family Medicine

## 2018-07-09 VITALS — BP 152/85 | HR 88 | Temp 98.1°F | Wt 118.0 lb

## 2018-07-09 DIAGNOSIS — J019 Acute sinusitis, unspecified: Secondary | ICD-10-CM | POA: Diagnosis not present

## 2018-07-09 MED ORDER — AZITHROMYCIN 250 MG PO TABS: ORAL_TABLET | ORAL | 0 refills | Status: DC

## 2018-07-09 MED ORDER — BENZONATATE 100 MG PO CAPS: 100.0000 mg | ORAL_CAPSULE | Freq: Two times a day (BID) | ORAL | 0 refills | Status: DC | PRN
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
## 2018-07-30 ENCOUNTER — Ambulatory Visit (INDEPENDENT_AMBULATORY_CARE_PROVIDER_SITE_OTHER): Payer: Medicare HMO

## 2018-07-30 VITALS — BP 122/64 | HR 74 | Temp 98.1°F | Resp 16 | Ht <= 58 in | Wt 115.4 lb

## 2018-07-30 DIAGNOSIS — Z23 Encounter for immunization: Secondary | ICD-10-CM | POA: Diagnosis not present

## 2018-07-30 DIAGNOSIS — Z0000 Encounter for general adult medical examination without abnormal findings: Secondary | ICD-10-CM | POA: Diagnosis not present

## 2018-07-30 NOTE — Patient Instructions (Addendum)
[name] , Thank you for taking time to come for your Medicare Wellness Visit. I appreciate your ongoing commitment to your health goals. Please review the following plan we discussed and let me know if I can assist you in the future.   Screening recommendations/referrals: Colonoscopy: no longer required Mammogram: no longer required Bone Density: no longer required Recommended yearly ophthalmology/optometry visit for glaucoma screening and checkup Recommended yearly dental visit for hygiene and checkup  Vaccinations: Influenza vaccine: done today  Pneumococcal vaccine: completed series Tdap vaccine: due, check with your insurance for coverage  Shingles vaccine: shingrix eligible, check with your insurance company for coverage     Advanced directives: Advance directive discussed with you today. I have provided a copy for you to complete at home and have notarized. Once this is complete please bring a copy in to our office so we can scan it into your chart.  Conditions/risks identified: Recommend drinking at least 6-8 glasses of water a day   Next appointment: Follow up on 10/01/2018 at 2:30am with Dr.Crissman. Follow up in one year for your annual wellness exam.    Preventive Care 65 Years and Older, Female Preventive care refers to lifestyle choices and visits with your health care provider that can promote health and wellness. What does preventive care include?  A yearly physical exam. This is also called an annual well check.  Dental exams once or twice a year.  Routine eye exams. Ask your health care provider how often you should have your eyes checked.  Personal lifestyle choices, including:  Daily care of your teeth and gums.  Regular physical activity.  Eating a healthy diet.  Avoiding tobacco and drug use.  Limiting alcohol use.  Practicing safe sex.  Taking low-dose aspirin every day.  Taking vitamin and mineral supplements as recommended by your health  care provider. What happens during an annual well check? The services and screenings done by your health care provider during your annual well check will depend on your age, overall health, lifestyle risk factors, and family history of disease. Counseling  Your health care provider may ask you questions about your:  Alcohol use.  Tobacco use.  Drug use.  Emotional well-being.  Home and relationship well-being.  Sexual activity.  Eating habits.  History of falls.  Memory and ability to understand (cognition).  Work and work Astronomer.  Reproductive health. Screening  You may have the following tests or measurements:  Height, weight, and BMI.  Blood pressure.  Lipid and cholesterol levels. These may be checked every 5 years, or more frequently if you are over 33 years old.  Skin check.  Lung cancer screening. You may have this screening every year starting at age 19 if you have a 30-pack-year history of smoking and currently smoke or have quit within the past 15 years.  Fecal occult blood test (FOBT) of the stool. You may have this test every year starting at age 17.  Flexible sigmoidoscopy or colonoscopy. You may have a sigmoidoscopy every 5 years or a colonoscopy every 10 years starting at age 60.  Hepatitis C blood test.  Hepatitis B blood test.  Sexually transmitted disease (STD) testing.  Diabetes screening. This is done by checking your blood sugar (glucose) after you have not eaten for a while (fasting). You may have this done every 1-3 years.  Bone density scan. This is done to screen for osteoporosis. You may have this done starting at age 48.  Mammogram. This may be done  every 1-2 years. Talk to your health care provider about how often you should have regular mammograms. Talk with your health care provider about your test results, treatment options, and if necessary, the need for more tests. Vaccines  Your health care provider may recommend certain  vaccines, such as:  Influenza vaccine. This is recommended every year.  Tetanus, diphtheria, and acellular pertussis (Tdap, Td) vaccine. You may need a Td booster every 10 years.  Zoster vaccine. You may need this after age 53.  Pneumococcal 13-valent conjugate (PCV13) vaccine. One dose is recommended after age 23.  Pneumococcal polysaccharide (PPSV23) vaccine. One dose is recommended after age 20. Talk to your health care provider about which screenings and vaccines you need and how often you need them. This information is not intended to replace advice given to you by your health care provider. Make sure you discuss any questions you have with your health care provider. Document Released: 11/25/2015 Document Revised: 07/18/2016 Document Reviewed: 08/30/2015 Elsevier Interactive Patient Education  2017 [place] Prevention in the Home Falls can cause injuries. They can happen to people of all ages. There are many things you can do to make your home safe and to help prevent falls. What can I do on the outside of my home?  Regularly fix the edges of walkways and driveways and fix any cracks.  Remove anything that might make you trip as you walk through a door, such as a raised step or threshold.  Trim any bushes or trees on the path to your home.  Use bright outdoor lighting.  Clear any walking paths of anything that might make someone trip, such as rocks or tools.  Regularly check to see if handrails are loose or broken. Make sure that both sides of any steps have handrails.  Any raised decks and porches should have guardrails on the edges.  Have any leaves, snow, or ice cleared regularly.  Use sand or salt on walking paths during winter.  Clean up any spills in your garage right away. This includes oil or grease spills. What can I do in the bathroom?  Use night lights.  Install grab bars by the toilet and in the tub and shower. Do not use towel bars as grab  bars.  Use non-skid mats or decals in the tub or shower.  If you need to sit down in the shower, use a plastic, non-slip stool.  Keep the floor dry. Clean up any water that spills on the floor as soon as it happens.  Remove soap buildup in the tub or shower regularly.  Attach bath mats securely with double-sided non-slip rug tape.  Do not have throw rugs and other things on the floor that can make you trip. What can I do in the bedroom?  Use night lights.  Make sure that you have a light by your bed that is easy to reach.  Do not use any sheets or blankets that are too big for your bed. They should not hang down onto the floor.  Have a firm chair that has side arms. You can use this for support while you get dressed.  Do not have throw rugs and other things on the floor that can make you trip. What can I do in the kitchen?  Clean up any spills right away.  Avoid walking on wet floors.  Keep items that you use a lot in easy-to-reach places.  If you need to reach something above you, use a  strong step stool that has a grab bar.  Keep electrical cords out of the way.  Do not use floor polish or wax that makes floors slippery. If you must use wax, use non-skid floor wax.  Do not have throw rugs and other things on the floor that can make you trip. What can I do with my stairs?  Do not leave any items on the stairs.  Make sure that there are handrails on both sides of the stairs and use them. Fix handrails that are broken or loose. Make sure that handrails are as long as the stairways.  Check any carpeting to make sure that it is firmly attached to the stairs. Fix any carpet that is loose or worn.  Avoid having throw rugs at the top or bottom of the stairs. If you do have throw rugs, attach them to the floor with carpet tape.  Make sure that you have a light switch at the top of the stairs and the bottom of the stairs. If you do not have them, ask someone to add them for  you. What else can I do to help prevent falls?  Wear shoes that:  Do not have high heels.  Have rubber bottoms.  Are comfortable and fit you well.  Are closed at the toe. Do not wear sandals.  If you use a stepladder:  Make sure that it is fully opened. Do not climb a closed stepladder.  Make sure that both sides of the stepladder are locked into place.  Ask someone to hold it for you, if possible.  Clearly mark and make sure that you can see:  Any grab bars or handrails.  First and last steps.  Where the edge of each step is.  Use tools that help you move around (mobility aids) if they are needed. These include:  Canes.  Walkers.  Scooters.  Crutches.  Turn on the lights when you go into a dark area. Replace any light bulbs as soon as they burn out.  Set up your furniture so you have a clear path. Avoid moving your furniture around.  If any of your floors are uneven, fix them.  If there are any pets around you, be aware of where they are.  Review your medicines with your doctor. Some medicines can make you feel dizzy. This can increase your chance of falling. Ask your doctor what other things that you can do to help prevent falls. This information is not intended to replace advice given to you by your health care provider. Make sure you discuss any questions you have with your health care provider. Document Released: 08/25/2009 Document Revised: 04/05/2016 Document Reviewed: 12/03/2014 Elsevier Interactive Patient Education  2017 [employer]  Influenza (Flu) Vaccine (Inactivated or Recombinant): What You Need to Know 1. Why get vaccinated? Influenza ("flu") is a contagious disease that spreads around the [place] every year, usually between October and May. Flu is caused by influenza viruses, and is spread mainly by coughing, sneezing, and close contact. Anyone can get flu. Flu strikes suddenly and can last several days. Symptoms vary by age, but can  include:  fever/chills  sore throat  muscle aches  fatigue  cough  headache  runny or stuffy nose  Flu can also lead to pneumonia and blood infections, and cause diarrhea and seizures in children. If you have a medical condition, such as heart or lung disease, flu can make it worse. Flu is more dangerous for some people. Infants and  young children, people 68 years of age and older, pregnant women, and people with certain health conditions or a weakened immune system are at greatest risk. Each year thousands of people in the [place] die from flu, and many more are hospitalized. Flu vaccine can:  keep you from getting flu,  make flu less severe if you do get it, and  keep you from spreading flu to your family and other people. 2. Inactivated and recombinant flu vaccines A dose of flu vaccine is recommended every flu season. Children 6 months through 62 years of age may need two doses during the same flu season. Everyone else needs only one dose each flu season. Some inactivated flu vaccines contain a very small amount of a mercury-based preservative called thimerosal. Studies have not shown thimerosal in vaccines to be harmful, but flu vaccines that do not contain thimerosal are available. There is no live flu virus in flu shots. They cannot cause the flu. There are many flu viruses, and they are always changing. Each year a new flu vaccine is made to protect against three or four viruses that are likely to cause disease in the upcoming flu season. But even when the vaccine doesn't exactly match these viruses, it may still provide some protection. Flu vaccine cannot prevent:  flu that is caused by a virus not covered by the vaccine, or  illnesses that look like flu but are not.  It takes about 2 weeks for protection to develop after vaccination, and protection lasts through the flu season. 3. Some people should not get this vaccine Tell the person who is giving you the  vaccine:  If you have any severe, life-threatening allergies. If you ever had a life-threatening allergic reaction after a dose of flu vaccine, or have a severe allergy to any part of this vaccine, you may be advised not to get vaccinated. Most, but not all, types of flu vaccine contain a small amount of egg protein.  If you ever had Guillain-Barr Syndrome (also called GBS). Some people with a history of GBS should not get this vaccine. This should be discussed with your doctor.  If you are not feeling well. It is usually okay to get flu vaccine when you have a mild illness, but you might be asked to come back when you feel better.  4. Risks of a vaccine reaction With any medicine, including vaccines, there is a chance of reactions. These are usually mild and go away on their own, but serious reactions are also possible. Most people who get a flu shot do not have any problems with it. Minor problems following a flu shot include:  soreness, redness, or swelling where the shot was given  hoarseness  sore, red or itchy eyes  cough  fever  aches  headache  itching  fatigue  If these problems occur, they usually begin soon after the shot and last 1 or 2 days. More serious problems following a flu shot can include the following:  There may be a small increased risk of Guillain-Barre Syndrome (GBS) after inactivated flu vaccine. This risk has been estimated at 1 or 2 additional cases per million people vaccinated. This is much lower than the risk of severe complications from flu, which can be prevented by flu vaccine.  Young children who get the flu shot along with pneumococcal vaccine (PCV13) and/or DTaP vaccine at the same time might be slightly more likely to have a seizure caused by fever. Ask your doctor for  more information. Tell your doctor if a child who is getting flu vaccine has ever had a seizure.  Problems that could happen after any injected vaccine:  People sometimes  faint after a medical procedure, including vaccination. Sitting or lying down for about 15 minutes can help prevent fainting, and injuries caused by a fall. Tell your doctor if you feel dizzy, or have vision changes or ringing in the ears.  Some people get severe pain in the shoulder and have difficulty moving the arm where a shot was given. This happens very rarely.  Any medication can cause a severe allergic reaction. Such reactions from a vaccine are very rare, estimated at about 1 in a million doses, and would happen within a few minutes to a few hours after the vaccination. As with any medicine, there is a very remote chance of a vaccine causing a serious injury or death. The safety of vaccines is always being monitored. For more information, visit: http://floyd.org/www.cdc.gov/vaccinesafety/ 5. What if there is a serious reaction? What should I look for? Look for anything that concerns you, such as signs of a severe allergic reaction, very high fever, or unusual behavior. Signs of a severe allergic reaction can include hives, swelling of the face and throat, difficulty breathing, a fast heartbeat, dizziness, and weakness. These would start a few minutes to a few hours after the vaccination. What should I do?  If you think it is a severe allergic reaction or other emergency that can't wait, call 9-1-1 and get the person to the nearest hospital. Otherwise, call your doctor.  Reactions should be reported to the Vaccine Adverse Event Reporting System (VAERS). Your doctor should file this report, or you can do it yourself through the VAERS web site at www.vaers.LAgents.nohhs.gov, or by calling 1-320-684-7573. ? VAERS does not give medical advice. 6. The National Vaccine Injury Compensation Program The [employer] Vaccine Injury Compensation Program (VICP) is a federal program that was created to compensate people who may have been injured by certain vaccines. Persons who believe they may have been injured by a vaccine can  learn about the program and about filing a claim by calling 1-551-489-6778 or visiting the VICP website at SpiritualWord.atwww.hrsa.gov/vaccinecompensation. There is a time limit to file a claim for compensation. 7. How can I learn more?  Ask your healthcare provider. He or she can give you the vaccine package insert or suggest other sources of information.  Call your local or state health department.  Contact the Centers for Disease Control and Prevention (CDC): ? Call 202-128-56431-(331)014-6367 (1-800-CDC-INFO) or ? Visit CDC's website at BiotechRoom.com.cywww.cdc.gov/flu Vaccine Information Statement, Inactivated Influenza Vaccine (06/18/2014) This information is not intended to replace advice given to you by your health care provider. Make sure you discuss any questions you have with your health care provider. Document Released: 08/23/2006 Document Revised: 07/19/2016 Document Reviewed: 07/19/2016 Elsevier Interactive Patient Education  2017 [employer]
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
## 2018-08-14 ENCOUNTER — Encounter: Payer: Self-pay | Admitting: Family Medicine

## 2018-08-14 ENCOUNTER — Ambulatory Visit (INDEPENDENT_AMBULATORY_CARE_PROVIDER_SITE_OTHER): Payer: Medicare HMO | Admitting: Family Medicine

## 2018-08-14 ENCOUNTER — Other Ambulatory Visit: Payer: Self-pay

## 2018-08-14 VITALS — BP 121/68 | HR 70 | Temp 97.8°F | Ht 60.0 in | Wt 117.0 lb

## 2018-08-14 DIAGNOSIS — S76011A Strain of muscle, fascia and tendon of right hip, initial encounter: Secondary | ICD-10-CM

## 2018-08-14 MED ORDER — TRIAMCINOLONE ACETONIDE 40 MG/ML IJ SUSP
40.0000 mg | Freq: Once | INTRAMUSCULAR | Status: AC
  Administered 2018-08-14: 40 mg via INTRAMUSCULAR

## 2018-08-14 MED ORDER — MELOXICAM 7.5 MG PO TABS
7.5000 mg | ORAL_TABLET | Freq: Every day | ORAL | 0 refills | Status: DC
Start: 2018-08-14 — End: 2018-09-15

## 2018-08-14 NOTE — Patient Instructions (Signed)
Piriformis Syndrome Rehab Ask your health care provider which exercises are safe for you. Do exercises exactly as told by your health care provider and adjust them as directed. It is normal to feel mild stretching, pulling, tightness, or discomfort as you do these exercises, but you should stop right away if you feel sudden pain or your pain gets worse.Do not begin these exercises until told by your health care provider. Stretching and range of motion exercises These exercises warm up your muscles and joints and improve the movement and flexibility of your hip and pelvis. These exercises also help to relieve pain, numbness, and tingling. Exercise A: Hip rotators  1. Lie on your back on a firm surface. 2. Pull your left / right knee toward your same shoulder with your left / right hand until your knee is pointing toward the ceiling. Hold your left / right ankle with your other hand. 3. Keeping your knee steady, gently pull your left / right ankle toward your other shoulder until you feel a stretch in your buttocks. 4. Hold this position for __________ seconds. Repeat __________ times. Complete this stretch __________ times a day. Exercise B: Hip extensors 1. Lie on your back on a firm surface. Both of your legs should be straight. 2. Pull your left / right knee to your chest. Hold your leg in this position by holding onto the back of your thigh or the front of your knee. 3. Hold this position for __________ seconds. 4. Slowly return to the starting position. Repeat __________ times. Complete this stretch __________ times a day. Strengthening exercises These exercises build strength and endurance in your hip and thigh muscles. Endurance is the ability to use your muscles for a long time, even after they get tired. Exercise C: Straight leg raises ( hip abductors) 1. Lie on your side with your left / right leg in the top position. Lie so your head, shoulder, knee, and hip line up. Bend your bottom  knee to help you balance. 2. Lift your top leg up 4-6 inches (10-15 cm), keeping your toes pointed straight ahead. 3. Hold this position for __________ seconds. 4. Slowly lower your leg to the starting position. Let your muscles relax completely. Repeat __________ times. Complete this exercise__________ times a day. Exercise D: Hip abductors and rotators, quadruped  1. Get on your hands and knees on a firm, lightly padded surface. Your hands should be directly below your shoulders, and your knees should be directly below your hips. 2. Lift your left / right knee out to the side. Keep your knee bent. Do not twist your body. 3. Hold this position for __________ seconds. 4. Slowly lower your leg. Repeat __________ times. Complete this exercise__________ times a day. Exercise E: Straight leg raises ( hip extensors) 1. Lie on your abdomen on a bed or a firm surface with a pillow under your hips. 2. Squeeze your buttock muscles and lift your left / right thigh off the bed. Do not let your back arch. 3. Hold this position for __________ seconds. 4. Slowly return to the starting position. Let your muscles relax completely before doing another repetition. Repeat __________ times. Complete this exercise__________ times a day. This information is not intended to replace advice given to you by your health care provider. Make sure you discuss any questions you have with your health care provider. Document Released: 10/29/2005 Document Revised: 07/03/2016 Document Reviewed: 10/11/2015 Elsevier Interactive Patient Education  2018 Else[employer] Inc[employer]  Piriformis Syndrome Rehab Ask your   health care provider which exercises are safe for you. Do exercises exactly as told by your health care provider and adjust them as directed. It is normal to feel mild stretching, pulling, tightness, or discomfort as you do these exercises, but you should stop right away if you feel sudden pain or your pain gets worse.Do not begin  these exercises until told by your health care provider. Stretching and range of motion exercises These exercises warm up your muscles and joints and improve the movement and flexibility of your hip and pelvis. These exercises also help to relieve pain, numbness, and tingling. Exercise A: Hip rotators  5. Lie on your back on a firm surface. 6. Pull your left / right knee toward your same shoulder with your left / right hand until your knee is pointing toward the ceiling. Hold your left / right ankle with your other hand. 7. Keeping your knee steady, gently pull your left / right ankle toward your other shoulder until you feel a stretch in your buttocks. 8. Hold this position for __________ seconds. Repeat __________ times. Complete this stretch __________ times a day. Exercise B: Hip extensors 5. Lie on your back on a firm surface. Both of your legs should be straight. 6. Pull your left / right knee to your chest. Hold your leg in this position by holding onto the back of your thigh or the front of your knee. 7. Hold this position for __________ seconds. 8. Slowly return to the starting position. Repeat __________ times. Complete this stretch __________ times a day. Strengthening exercises These exercises build strength and endurance in your hip and thigh muscles. Endurance is the ability to use your muscles for a long time, even after they get tired. Exercise C: Straight leg raises ( hip abductors) 5. Lie on your side with your left / right leg in the top position. Lie so your head, shoulder, knee, and hip line up. Bend your bottom knee to help you balance. 6. Lift your top leg up 4-6 inches (10-15 cm), keeping your toes pointed straight ahead. 7. Hold this position for __________ seconds. 8. Slowly lower your leg to the starting position. Let your muscles relax completely. Repeat __________ times. Complete this exercise__________ times a day. Exercise D: Hip abductors and rotators,  quadruped  5. Get on your hands and knees on a firm, lightly padded surface. Your hands should be directly below your shoulders, and your knees should be directly below your hips. 6. Lift your left / right knee out to the side. Keep your knee bent. Do not twist your body. 7. Hold this position for __________ seconds. 8. Slowly lower your leg. Repeat __________ times. Complete this exercise__________ times a day. Exercise E: Straight leg raises ( hip extensors) 5. Lie on your abdomen on a bed or a firm surface with a pillow under your hips. 6. Squeeze your buttock muscles and lift your left / right thigh off the bed. Do not let your back arch. 7. Hold this position for __________ seconds. 8. Slowly return to the starting position. Let your muscles relax completely before doing another repetition. Repeat __________ times. Complete this exercise__________ times a day. This information is not intended to replace advice given to you by your health care provider. Make sure you discuss any questions you have with your health care provider. Document Released: 10/29/2005 Document Revised: 07/03/2016 Document Reviewed: 10/11/2015 Elsevier Interactive Patient Education  2018 Elsevier Inc.  
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
## 2018-09-15 ENCOUNTER — Other Ambulatory Visit: Payer: Self-pay | Admitting: Family Medicine

## 2018-09-30 ENCOUNTER — Ambulatory Visit: Payer: Medicare HMO | Admitting: Family Medicine

## 2018-10-01 ENCOUNTER — Ambulatory Visit: Payer: Medicare HMO | Admitting: Family Medicine

## 2018-10-12 ENCOUNTER — Other Ambulatory Visit: Payer: Self-pay | Admitting: Family Medicine

## 2018-10-13 ENCOUNTER — Ambulatory Visit: Payer: Medicare HMO | Admitting: Family Medicine

## 2018-10-13 NOTE — Telephone Encounter (Signed)
Refill request for meloxicam 7.5 mg.   Last 3 appointments have been canceled or no show.  LOV  08/14/18  LR  09/15/18 for 30 tabs  Provider  Roosvelt Maserachel Lane PA

## 2018-10-28 ENCOUNTER — Ambulatory Visit: Payer: Self-pay

## 2018-10-28 NOTE — Telephone Encounter (Signed)
Pt. Reports she started seeing blood in her stools Saturday. Bright red and is mixed in with the stool. States she thinks it's a small amount. Denies any other symptoms. No pain, diarrhea or constipation. States she had "this about 4 years ago and they said I had polyps." Appointment made for tomorrow. Instructed if symptoms worsen to call back.  Reason for Disposition . MODERATE rectal bleeding (small blood clots, passing blood without stool, or toilet water turns red)  Answer Assessment - Initial Assessment Questions 1. APPEARANCE of BLOOD: "What color is it?" "Is it passed separately, on the surface of the stool, or mixed in with the stool?"      Mixed in with the stools 2. AMOUNT: "How much blood was passed?"      Small amount 3. FREQUENCY: "How many times has blood been passed with the stools?"      Every stool - once a day 4. ONSET: "When was the blood first seen in the stools?" (Days or weeks)      Saturday 5. DIARRHEA: "Is there also some diarrhea?" If so, ask: "How many diarrhea stools were passed in past 24 hours?"      No 6. CONSTIPATION: "Do you have constipation?" If so, "How bad is it?"     No 7. RECURRENT SYMPTOMS: "Have you had blood in your stools before?" If so, ask: "When was the last time?" and "What happened that time?"      Yes 8. BLOOD THINNERS: "Do you take any blood thinners?" (e.g., Coumadin/warfarin, Pradaxa/dabigatran, aspirin)     No 9. OTHER SYMPTOMS: "Do you have any other symptoms?"  (e.g., abdominal pain, vomiting, dizziness, fever)     No 10. PREGNANCY: "Is there any chance you are pregnant?" "When was your last menstrual period?"       No  Protocols used: RECTAL BLEEDING-A-AH

## 2018-10-29 ENCOUNTER — Ambulatory Visit (INDEPENDENT_AMBULATORY_CARE_PROVIDER_SITE_OTHER): Payer: Medicare HMO | Admitting: Family Medicine

## 2018-10-29 ENCOUNTER — Encounter: Payer: Self-pay | Admitting: Family Medicine

## 2018-10-29 DIAGNOSIS — I10 Essential (primary) hypertension: Secondary | ICD-10-CM

## 2018-10-29 DIAGNOSIS — K922 Gastrointestinal hemorrhage, unspecified: Secondary | ICD-10-CM | POA: Insufficient documentation

## 2018-10-29 MED ORDER — OMEPRAZOLE 20 MG PO CPDR: 20.0000 mg | DELAYED_RELEASE_CAPSULE | Freq: Every day | ORAL | 3 refills | Status: DC

## 2018-10-29 NOTE — Assessment & Plan Note (Signed)
Blood pressure elevated but stable due to falling risk will not change medications are are add.
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
## 2018-10-29 NOTE — Assessment & Plan Note (Signed)
GI bleed most likely secondary to taking meloxicam gave written directions to stop meloxicam start Prilosec check CBC and CMP today follow-up pending results

## 2018-10-30 ENCOUNTER — Encounter: Payer: Self-pay | Admitting: Family Medicine

## 2018-10-30 LAB — CBC WITH DIFFERENTIAL/PLATELET
Basophils Absolute: 0.1 10*3/uL (ref 0.0–0.2)
Basos: 1 %
EOS (ABSOLUTE): 0.2 10*3/uL (ref 0.0–0.4)
Eos: 3 %
Hematocrit: 36.2 % (ref 34.0–46.6)
Hemoglobin: 11.8 g/dL (ref 11.1–15.9)
Immature Grans (Abs): 0 10*3/uL (ref 0.0–0.1)
Immature Granulocytes: 1 %
Lymphocytes Absolute: 2 10*3/uL (ref 0.7–3.1)
Lymphs: 23 %
MCH: 28.5 pg (ref 26.6–33.0)
MCHC: 32.6 g/dL (ref 31.5–35.7)
MCV: 87 fL (ref 79–97)
Monocytes Absolute: 0.7 10*3/uL (ref 0.1–0.9)
Monocytes: 9 %
Neutrophils Absolute: 5.6 10*3/uL (ref 1.4–7.0)
Neutrophils: 63 %
Platelets: 249 10*3/uL (ref 150–450)
RBC: 4.14 x10E6/uL (ref 3.77–5.28)
RDW: 12.8 % (ref 12.3–15.4)
WBC: 8.7 10*3/uL (ref 3.4–10.8)

## 2018-10-30 LAB — COMPREHENSIVE METABOLIC PANEL
ALT: 10 IU/L (ref 0–32)
AST: 15 IU/L (ref 0–40)
Albumin/Globulin Ratio: 1.2 (ref 1.2–2.2)
Albumin: 3.8 g/dL (ref 3.2–4.6)
Alkaline Phosphatase: 64 IU/L (ref 39–117)
BUN/Creatinine Ratio: 23 (ref 12–28)
BUN: 18 mg/dL (ref 10–36)
Bilirubin Total: <0.2 mg/dL (ref 0.0–1.2)
CO2: 25 mmol/L (ref 20–29)
Calcium: 9.2 mg/dL (ref 8.7–10.3)
Chloride: 100 mmol/L (ref 96–106)
Creatinine, Ser: 0.77 mg/dL (ref 0.57–1.00)
GFR calc Af Amer: 79 mL/min/{1.73_m2} (ref >59)
GFR calc non Af Amer: 68 mL/min/{1.73_m2} (ref >59)
Globulin, Total: 3.3 g/dL (ref 1.5–4.5)
Glucose: 87 mg/dL (ref 65–99)
Potassium: 4.3 mmol/L (ref 3.5–5.2)
Sodium: 137 mmol/L (ref 134–144)
Total Protein: 7.1 g/dL (ref 6.0–8.5)

## 2018-11-24 ENCOUNTER — Ambulatory Visit (INDEPENDENT_AMBULATORY_CARE_PROVIDER_SITE_OTHER): Payer: Medicare HMO | Admitting: Family Medicine

## 2018-11-24 ENCOUNTER — Encounter: Payer: Self-pay | Admitting: Family Medicine

## 2018-11-24 VITALS — BP 128/73 | HR 78 | Temp 98.2°F | Ht 60.0 in | Wt 118.0 lb

## 2018-11-24 DIAGNOSIS — K922 Gastrointestinal hemorrhage, unspecified: Secondary | ICD-10-CM | POA: Diagnosis not present

## 2018-11-24 DIAGNOSIS — I10 Essential (primary) hypertension: Secondary | ICD-10-CM | POA: Diagnosis not present

## 2018-11-24 DIAGNOSIS — T7840XA Allergy, unspecified, initial encounter: Secondary | ICD-10-CM

## 2018-11-24 NOTE — Assessment & Plan Note (Signed)
Discussed allergic reaction care and treatment use of Allegra

## 2018-11-24 NOTE — Assessment & Plan Note (Signed)
The current medical regimen is effective;  continue present plan and medications.  

## 2018-11-24 NOTE — Progress Notes (Signed)
BP 128/73   Pulse 78   Temp 98.2 F (36.8 C) (Oral)   Ht 5' (1.524 m)   Wt 118 lb (53.5 kg)   LMP  (LMP Unknown)   SpO2 96%   BMI 23.05 kg/m    Subjective:    Patient ID: Rhonda Ingram, female    DOB: [birth year], 85 y.o.   MRN: [id number]  HPI: Rhonda Ingram is a 85 y.o. female  Chief Complaint  Patient presents with  . Follow-up  . Hypertension  Patient with complaints of follow-up hypertension doing well no complaints from medications taking faithfully without problems. Blood pressure good control. Patient accompanied by HER-2 daughters who assists with history. Patient no further GI bleed after stopping meloxicam.  No further problems with anemia or signs or symptoms. Discussed use of Benadryl and potential increased risk of falls discussed Allegra  Relevant past medical, surgical, family and social history reviewed and updated as indicated. Interim medical history since our last visit reviewed. Allergies and medications reviewed and updated.  Review of Systems  Constitutional: Negative.   Respiratory: Negative.   Cardiovascular: Negative.     Per HPI unless specifically indicated above     Objective:    BP 128/73   Pulse 78   Temp 98.2 F (36.8 C) (Oral)   Ht 5' (1.524 m)   Wt 118 lb (53.5 kg)   LMP  (LMP Unknown)   SpO2 96%   BMI 23.05 kg/m   Wt Readings from Last 3 Encounters:  11/24/18 118 lb (53.5 kg)  10/29/18 117 lb (53.1 kg)  08/14/18 117 lb (53.1 kg)    Physical Exam Constitutional:      Appearance: She is well-developed.  HENT:     Head: Normocephalic and atraumatic.  Eyes:     Conjunctiva/sclera: Conjunctivae normal.  Neck:     Musculoskeletal: Normal range of motion.  Cardiovascular:     Rate and Rhythm: Normal rate and regular rhythm.     Heart sounds: Normal heart sounds.  Pulmonary:     Effort: Pulmonary effort is normal.     Breath sounds: Normal breath sounds.  Musculoskeletal: Normal range of motion.  Skin:  Findings: No erythema.  Neurological:     Mental Status: She is alert and oriented to person, place, and time.  Psychiatric:        Behavior: Behavior normal.        Thought Content: Thought content normal.        Judgment: Judgment normal.     Results for orders placed or performed in visit on 10/29/18  CBC with Differential/Platelet  Result Value Ref Range   WBC 8.7 3.4 - 10.8 x10E3/uL   RBC 4.14 3.77 - 5.28 x10E6/uL   Hemoglobin 11.8 11.1 - 15.9 g/dL   Hematocrit 36.2 34.0 - 46.6 %   MCV 87 79 - 97 fL   MCH 28.5 26.6 - 33.0 pg   MCHC 32.6 31.5 - 35.7 g/dL   RDW 12.8 12.3 - 15.4 %   Platelets 249 150 - 450 x10E3/uL   Neutrophils 63 Not Estab. %   Lymphs 23 Not Estab. %   Monocytes 9 Not Estab. %   Eos 3 Not Estab. %   Basos 1 Not Estab. %   Neutrophils Absolute 5.6 1.4 - 7.0 x10E3/uL   Lymphocytes Absolute 2.0 0.7 - 3.1 x10E3/uL   Monocytes Absolute 0.7 0.1 - 0.9 x10E3/uL   EOS (ABSOLUTE) 0.2 0.0 - 0.4 x10E3/uL   Basophils  Absolute 0.1 0.0 - 0.2 x10E3/uL   Immature Granulocytes 1 Not Estab. %   Immature Grans (Abs) 0.0 0.0 - 0.1 x10E3/uL  Comprehensive metabolic panel  Result Value Ref Range   Glucose 87 65 - 99 mg/dL   BUN 18 10 - 36 mg/dL   Creatinine, Ser 0.77 0.57 - 1.00 mg/dL   GFR calc non Af Amer 68 >59 mL/min/1.73   GFR calc Af Amer 79 >59 mL/min/1.73   BUN/Creatinine Ratio 23 12 - 28   Sodium 137 134 - 144 mmol/L   Potassium 4.3 3.5 - 5.2 mmol/L   Chloride 100 96 - 106 mmol/L   CO2 25 20 - 29 mmol/L   Calcium 9.2 8.7 - 10.3 mg/dL   Total Protein 7.1 6.0 - 8.5 g/dL   Albumin 3.8 3.2 - 4.6 g/dL   Globulin, Total 3.3 1.5 - 4.5 g/dL   Albumin/Globulin Ratio 1.2 1.2 - 2.2   Bilirubin Total <0.2 0.0 - 1.2 mg/dL   Alkaline Phosphatase 64 39 - 117 IU/L   AST 15 0 - 40 IU/L   ALT 10 0 - 32 IU/L      Assessment & Plan:   Problem List Items Addressed This Visit      Cardiovascular and Mediastinum   Hypertension - Primary    The current medical regimen is  effective;  continue present plan and medications.         Digestive   GI bleed    No further signs or symptoms      Relevant Orders   CBC with Differential/Platelet     Other   Allergic reaction    Discussed allergic reaction care and treatment use of Allegra          Follow up plan: Return in about 6 months (around 05/25/2019) for Physical Exam.

## 2018-11-24 NOTE — Assessment & Plan Note (Signed)
No further signs or symptoms. 

## 2018-11-25 ENCOUNTER — Encounter: Payer: Self-pay | Admitting: Family Medicine

## 2018-11-25 LAB — CBC WITH DIFFERENTIAL/PLATELET
Basophils Absolute: 0.1 10*3/uL (ref 0.0–0.2)
Basos: 1 %
EOS (ABSOLUTE): 0.1 10*3/uL (ref 0.0–0.4)
Eos: 2 %
Hematocrit: 36.6 % (ref 34.0–46.6)
Hemoglobin: 12.1 g/dL (ref 11.1–15.9)
Immature Grans (Abs): 0 10*3/uL (ref 0.0–0.1)
Immature Granulocytes: 0 %
Lymphocytes Absolute: 1.9 10*3/uL (ref 0.7–3.1)
Lymphs: 28 %
MCH: 28.3 pg (ref 26.6–33.0)
MCHC: 33.1 g/dL (ref 31.5–35.7)
MCV: 86 fL (ref 79–97)
Monocytes Absolute: 0.7 10*3/uL (ref 0.1–0.9)
Monocytes: 10 %
Neutrophils Absolute: 3.8 10*3/uL (ref 1.4–7.0)
Neutrophils: 59 %
Platelets: 271 10*3/uL (ref 150–450)
RBC: 4.27 x10E6/uL (ref 3.77–5.28)
RDW: 12.6 % (ref 11.7–15.4)
WBC: 6.6 10*3/uL (ref 3.4–10.8)

## 2018-12-02 ENCOUNTER — Telehealth: Payer: Self-pay

## 2018-12-02 NOTE — Telephone Encounter (Signed)
Prescription for Triamcinolone acetonide 0.1% topical cream

## 2018-12-03 MED ORDER — TRIAMCINOLONE ACETONIDE 0.1 % EX CREA: 1.0000 "application " | TOPICAL_CREAM | Freq: Two times a day (BID) | CUTANEOUS | 0 refills | Status: DC

## 2018-12-25 ENCOUNTER — Other Ambulatory Visit: Payer: Self-pay | Admitting: Family Medicine

## 2019-03-13 ENCOUNTER — Other Ambulatory Visit: Payer: Self-pay | Admitting: Family Medicine

## 2019-04-06 ENCOUNTER — Other Ambulatory Visit: Payer: Self-pay | Admitting: Family Medicine

## 2019-04-06 DIAGNOSIS — I10 Essential (primary) hypertension: Secondary | ICD-10-CM

## 2019-05-25 ENCOUNTER — Encounter: Payer: Self-pay | Admitting: Family Medicine

## 2019-05-25 ENCOUNTER — Other Ambulatory Visit: Payer: Self-pay

## 2019-05-25 ENCOUNTER — Ambulatory Visit (INDEPENDENT_AMBULATORY_CARE_PROVIDER_SITE_OTHER): Payer: Medicare HMO | Admitting: Family Medicine

## 2019-05-25 DIAGNOSIS — Z7189 Other specified counseling: Secondary | ICD-10-CM | POA: Diagnosis not present

## 2019-05-25 DIAGNOSIS — E785 Hyperlipidemia, unspecified: Secondary | ICD-10-CM

## 2019-05-25 DIAGNOSIS — I10 Essential (primary) hypertension: Secondary | ICD-10-CM

## 2019-05-25 NOTE — Assessment & Plan Note (Signed)
A voluntary discussion about advanced care planning including explanation and discussion of advanced directives was extentively discussed with the patient.  Explained about the healthcare proxy and living will was reviewed and packet with forms with expiration of how to fill them out was given.  Time spent: Encounter 16+ min individuals present: Patient 

## 2019-05-25 NOTE — Assessment & Plan Note (Signed)
The current medical regimen is effective;  continue present plan and medications.  

## 2019-05-25 NOTE — Progress Notes (Signed)
LMP  (LMP Unknown)    Subjective:    Patient ID: Rhonda Ingram, female    DOB: [birth year], 85 y.o.   MRN: [id number]  HPI: Rhonda Ingram is a 84 y.o. female  Med check Patient all in all doing well no complaints has good care system in place to help with patient.  Patient mention her daughter who she is cared for for years but due to [place] is unable to visit. Medical issues stable along with medication. Patient being cautious with COVID-19 and limiting interactions.   Relevant past medical, surgical, family and social history reviewed and updated as indicated. Interim medical history since our last visit reviewed. Allergies and medications reviewed and updated.  Review of Systems  Constitutional: Negative.   HENT: Negative.   Eyes: Negative.   Respiratory: Negative.   Cardiovascular: Negative.   Gastrointestinal: Negative.   Endocrine: Negative.   Genitourinary: Negative.   Musculoskeletal: Negative.   Skin: Negative.   Allergic/Immunologic: Negative.   Neurological: Negative.   Hematological: Negative.   Psychiatric/Behavioral: Negative.     Per HPI unless specifically indicated above     Objective:    LMP  (LMP Unknown)   Wt Readings from Last 3 Encounters:  11/24/18 118 lb (53.5 kg)  10/29/18 117 lb (53.1 kg)  08/14/18 117 lb (53.1 kg)    Physical Exam  Results for orders placed or performed in visit on 11/24/18  CBC with Differential/Platelet  Result Value Ref Range   WBC 6.6 3.4 - 10.8 x10E3/uL   RBC 4.27 3.77 - 5.28 x10E6/uL   Hemoglobin 12.1 11.1 - 15.9 g/dL   Hematocrit 36.6 34.0 - 46.6 %   MCV 86 79 - 97 fL   MCH 28.3 26.6 - 33.0 pg   MCHC 33.1 31.5 - 35.7 g/dL   RDW 12.6 11.7 - 15.4 %   Platelets 271 150 - 450 x10E3/uL   Neutrophils 59 Not Estab. %   Lymphs 28 Not Estab. %   Monocytes 10 Not Estab. %   Eos 2 Not Estab. %   Basos 1 Not Estab. %   Neutrophils Absolute 3.8 1.4 - 7.0 x10E3/uL   Lymphocytes Absolute 1.9 0.7 - 3.1 x10E3/uL    Monocytes Absolute 0.7 0.1 - 0.9 x10E3/uL   EOS (ABSOLUTE) 0.1 0.0 - 0.4 x10E3/uL   Basophils Absolute 0.1 0.0 - 0.2 x10E3/uL   Immature Granulocytes 0 Not Estab. %   Immature Grans (Abs) 0.0 0.0 - 0.1 x10E3/uL      Assessment & Plan:   Problem List Items Addressed This Visit      Cardiovascular and Mediastinum   Hypertension    The current medical regimen is effective;  continue present plan and medications.         Other   Hyperlipidemia    The current medical regimen is effective;  continue present plan and medications.       Advanced care planning/counseling discussion    A voluntary discussion about advanced care planning including explanation and discussion of advanced directives was extentively discussed with the patient.  Explained about the healthcare proxy and living will was reviewed and packet with forms with expiration of how to fill them out was given.  Time spent: Encounter 16+ min individuals present: Patient          Telemedicine using audio/video telecommunications for a synchronous communication visit. Today's visit due to COVID-19 isolation precautions I connected with and verified that I am speaking with the correct person  using two identifiers.   I discussed the limitations, risks, security and privacy concerns of performing an evaluation and management service by telecommunication and the availability of in person appointments. I also discussed with the patient that there may be a patient responsible charge related to this service. The patient expressed understanding and agreed to proceed. The patient's location is home. I am at home.   I discussed the assessment and treatment plan with the patient. The patient was provided an opportunity to ask questions and all were answered. The patient agreed with the plan and demonstrated an understanding of the instructions.   The patient was advised to call back or seek an in-person evaluation if the symptoms  worsen or if the condition fails to improve as anticipated.   I provided 21+ minutes of time during this encounter. Follow up plan: Return in about 6 months (around 11/25/2019).  Will do labs at that time.

## 2019-08-03 ENCOUNTER — Encounter: Payer: Self-pay | Admitting: Nurse Practitioner

## 2019-08-03 ENCOUNTER — Other Ambulatory Visit: Payer: Self-pay

## 2019-08-03 ENCOUNTER — Ambulatory Visit (INDEPENDENT_AMBULATORY_CARE_PROVIDER_SITE_OTHER): Payer: Medicare HMO

## 2019-08-03 ENCOUNTER — Ambulatory Visit (INDEPENDENT_AMBULATORY_CARE_PROVIDER_SITE_OTHER): Payer: Medicare HMO | Admitting: Nurse Practitioner

## 2019-08-03 VITALS — BP 134/66 | HR 84 | Resp 15 | Ht 60.0 in | Wt 113.0 lb

## 2019-08-03 VITALS — BP 145/70 | HR 84 | Temp 98.0°F | Resp 15 | Ht 60.0 in | Wt 113.0 lb

## 2019-08-03 DIAGNOSIS — Z0000 Encounter for general adult medical examination without abnormal findings: Secondary | ICD-10-CM | POA: Diagnosis not present

## 2019-08-03 DIAGNOSIS — D692 Other nonthrombocytopenic purpura: Secondary | ICD-10-CM | POA: Diagnosis not present

## 2019-08-03 DIAGNOSIS — E559 Vitamin D deficiency, unspecified: Secondary | ICD-10-CM | POA: Diagnosis not present

## 2019-08-03 DIAGNOSIS — I10 Essential (primary) hypertension: Secondary | ICD-10-CM | POA: Diagnosis not present

## 2019-08-03 DIAGNOSIS — Z23 Encounter for immunization: Secondary | ICD-10-CM

## 2019-08-03 DIAGNOSIS — E785 Hyperlipidemia, unspecified: Secondary | ICD-10-CM

## 2019-08-03 NOTE — Assessment & Plan Note (Signed)
No current medications.  No lipid panel today.  Patient age 85, risk vs benefit of medication with advanced age discussed and 10 year risk score discussed.

## 2019-08-03 NOTE — Progress Notes (Addendum)
BP 134/66 (BP Location: Left Arm, Patient Position: Sitting)   Pulse 84   Resp 15   Ht 5' (1.524 m)   Wt 113 lb (51.3 kg)   LMP  (LMP Unknown)   BMI 22.07 kg/m    Subjective:    Patient ID: Rhonda Ingram, female    DOB: [birth year], 85 y.o.   MRN: [id number]  HPI: Rhonda Ingram is a 85 y.o. female presenting on 08/03/2019 for comprehensive medical examination. Current medical complaints include:none  She currently lives with: self Menopausal Symptoms: no   HYPERTENSION / HYPERLIPIDEMIA Continues on Benazepril 5 MG and no statin.   Satisfied with current treatment? yes Duration of hypertension: chronic BP monitoring frequency: rarely BP range: 134/82 about a month ago BP medication side effects: no Duration of hyperlipidemia: chronic Aspirin: no Recent stressors: no Recurrent headaches: no Visual changes: no Palpitations: no Dyspnea: no Chest pain: no Lower extremity edema: no Dizzy/lightheaded: no   6CIT Screen 08/03/2019 07/30/2018 07/24/2017  What Year? 0 points 0 points 0 points  What month? 0 points 0 points 0 points  What time? 0 points 0 points 0 points  Count back from 20 0 points 0 points 0 points  Months in reverse 0 points 0 points 0 points  Repeat phrase 2 points 2 points 2 points  Total Score 2 2 2     Depression Screen done today and results listed below:  Depression screen [facility] 2/9 08/03/2019 07/30/2018 03/18/2018 08/20/2017 07/24/2017  Decreased Interest 0 0 0 0 0  Down, Depressed, Hopeless 0 0 0 0 0  PHQ - 2 Score 0 0 0 0 0    The patient does not have a history of falls. I did not complete a risk assessment for falls. A plan of care for falls was not documented.   Past Medical History:  Past Medical History:  Diagnosis Date  . Arthritis    osteoarthritis  . Hyperlipidemia   . Hypertension   . Osteoporosis   . Vitamin D deficiency     Surgical History:  Past Surgical History:  Procedure Laterality Date  . CHOLECYSTECTOMY    . EYE  SURGERY     cataract extraction  . VENTRAL HERNIA REPAIR N/A 10/08/2016   Procedure: HERNIA REPAIR VENTRAL ADULT;  Surgeon: Christene Lye, MD;  Location: [facility] ORS;  Service: General;  Laterality: N/A;    Medications:  Current Outpatient Medications on File Prior to Visit  Medication Sig  . benazepril (LOTENSIN) 5 MG tablet TAKE 1 TABLET EVERY DAY  . diphenhydrAMINE (BENADRYL) 25 MG tablet Take 25 mg by mouth every 6 (six) hours as needed. Taking 1/2 tablet to 1 tablet prn at night for sleep  . fluticasone (FLONASE) 50 MCG/ACT nasal spray Place 1-2 sprays into both nostrils daily as needed (for allergies.).   [name] triamcinolone cream (KENALOG) 0.1 % APPLY TWICE DAILY AS DIRECTED (Patient not taking: Reported on 08/03/2019)   No current facility-administered medications on file prior to visit.    REVIEWED MEDICATION LIST WITH PATIENT, SHE DOES REPORT OCCASIONAL USE OF BENADRYL AT NIGHT TO SLEEP.  EDUCATED HER ON BEERS CRITERIA AND THAT THIS IS INAPPROPRIATE MEDICATION FOR HER AGE GROUP, RECOMMENDED TO STOP TAKING THIS AND SWITCH TO MELATONIN AS NEEDED.  Allergies:  Allergies  Allergen Reactions  . Meat Extract Shortness Of Breath  . 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 Marital status: Divorced    Spouse name: Not on file  . Number of children: Not on file  . Years of education: Not on file  . Highest education level: 7th grade  Occupational History  . Occupation: retired   Engineer, productionocial Needs  . Financial resource strain: Somewhat hard  . Food insecurity    Worry: Never true    Inability: Never true  . Transportation needs    Medical: No    Non-medical: No  Tobacco Use  . Smoking status: Never Smoker  . Smokeless tobacco: Never Used  Substance and Sexual Activity  . Alcohol use: No  . Drug use: No  . Sexual activity: Never  Lifestyle  . Physical activity     Days per week: 0 days    Minutes per session: 0 min  . Stress: Not at all  Relationships  . Social connections    Talks on phone: More than three times a week    Gets together: More than three times a week    Attends religious service: Never    Active member of club or organization: No    Attends meetings of clubs or organizations: Never    Relationship status: Widowed  . Intimate partner violence    Fear of current or ex partner: No    Emotionally abused: No    Physically abused: No    Forced sexual activity: No  Other Topics Concern  . Not on file  Social History Narrative  . Not on file   Social History   Tobacco Use  Smoking Status Never Smoker  Smokeless Tobacco Never Used   Social History   Substance and Sexual Activity  Alcohol Use No    Family History:  Family History  Problem Relation Age of Onset  . Hypertension Mother   . Stroke Mother   . Cancer Father        lung  . Hypertension Sister   . Hyperlipidemia Sister   . Hypertension Daughter   . Cerebral palsy Daughter   . Hypertension Maternal Grandmother   . Hypertension Maternal Grandfather   . Hypertension Paternal Grandmother   . Hypertension Paternal Grandfather   . Hypertension Sister   . Hyperlipidemia Sister   . Migraines Daughter   . Diabetes Brother   . Hypertension Brother   . Hyperlipidemia Brother     Past medical history, surgical history, medications, allergies, family history and social history reviewed with patient today and changes made to appropriate areas of the chart.   Review of Systems - negative All other ROS negative except what is listed above and in the HPI.      Objective:    BP 134/66 (BP Location: Left Arm, Patient Position: Sitting)   Pulse 84   Resp 15   Ht 5' (1.524 m)   Wt 113 lb (51.3 kg)   LMP  (LMP Unknown)   BMI 22.07 kg/m   Wt Readings from Last 3 Encounters:  08/03/19 113 lb (51.3 kg)  08/03/19 113 lb (51.3 kg)  11/24/18 118 lb (53.5 kg)     Physical Exam Vitals signs and nursing note reviewed.  Constitutional:      General: She is awake. She is not in acute distress.    Appearance: She is well-developed. She is not ill-appearing.  HENT:     Head: Normocephalic.     Right Ear: Hearing, tympanic membrane, ear canal and external ear normal. No drainage.  Left Ear: Hearing, tympanic membrane, ear canal and external ear normal. No drainage.     Nose: Nose normal.     Mouth/Throat:     Mouth: Mucous membranes are moist.     Pharynx: Oropharynx is clear. Uvula midline.  Eyes:     General: Lids are normal.        Right eye: No discharge.        Left eye: No discharge.     Extraocular Movements: Extraocular movements intact.     Conjunctiva/sclera: Conjunctivae normal.     Pupils: Pupils are equal, round, and reactive to light.     Visual Fields: Right eye visual fields normal and left eye visual fields normal.  Neck:     Musculoskeletal: Normal range of motion and neck supple.     Thyroid: No thyromegaly.     Vascular: No carotid bruit.  Cardiovascular:     Rate and Rhythm: Normal rate and regular rhythm.     Heart sounds: Normal heart sounds. No murmur. No gallop.   Pulmonary:     Effort: Pulmonary effort is normal. No accessory muscle usage or respiratory distress.     Breath sounds: Normal breath sounds.  Abdominal:     General: Bowel sounds are normal.     Palpations: Abdomen is soft. There is no hepatomegaly or splenomegaly.     Tenderness: There is no abdominal tenderness.  Musculoskeletal:     Right lower leg: No edema.     Left lower leg: No edema.  Lymphadenopathy:     Head:     Right side of head: No submental, submandibular, tonsillar, preauricular or posterior auricular adenopathy.     Left side of head: No submental, submandibular, tonsillar, preauricular or posterior auricular adenopathy.     Cervical: No cervical adenopathy.  Skin:    General: Skin is warm and dry.     Capillary Refill: Capillary  refill takes less than 2 seconds.     Findings: No rash.     Comments: Multiple areas of pale purple bruises to bilateral upper extremities.  Skin intact.  Neurological:     Mental Status: She is alert and oriented to person, place, and time.     Deep Tendon Reflexes: Reflexes are normal and symmetric.     Reflex Scores:      Brachioradialis reflexes are 2+ on the right side and 2+ on the left side.      Patellar reflexes are 2+ on the right side and 2+ on the left side. Psychiatric:        Attention and Perception: Attention normal.        Mood and Affect: Mood normal.        Speech: Speech normal.        Behavior: Behavior normal. Behavior is cooperative.        Thought Content: Thought content normal.        Cognition and Memory: Cognition normal.        Judgment: Judgment normal.     Results for orders placed or performed in visit on 11/24/18  CBC with Differential/Platelet  Result Value Ref Range   WBC 6.6 3.4 - 10.8 x10E3/uL   RBC 4.27 3.77 - 5.28 x10E6/uL   Hemoglobin 12.1 11.1 - 15.9 g/dL   Hematocrit 09.8 11.9 - 46.6 %   MCV 86 79 - 97 fL   MCH 28.3 26.6 - 33.0 pg   MCHC 33.1 31.5 - 35.7 g/dL   RDW 14.7 82.9 -  15.4 %   Platelets 271 150 - 450 x10E3/uL   Neutrophils 59 Not Estab. %   Lymphs 28 Not Estab. %   Monocytes 10 Not Estab. %   Eos 2 Not Estab. %   Basos 1 Not Estab. %   Neutrophils Absolute 3.8 1.4 - 7.0 x10E3/uL   Lymphocytes Absolute 1.9 0.7 - 3.1 x10E3/uL   Monocytes Absolute 0.7 0.1 - 0.9 x10E3/uL   EOS (ABSOLUTE) 0.1 0.0 - 0.4 x10E3/uL   Basophils Absolute 0.1 0.0 - 0.2 x10E3/uL   Immature Granulocytes 0 Not Estab. %   Immature Grans (Abs) 0.0 0.0 - 0.1 x10E3/uL      Assessment & Plan:   Problem List Items Addressed This Visit      Cardiovascular and Mediastinum   Hypertension    Chronic, stable.  BP at goal for age.  Continue current medication regimen and adjust as needed.  Labs today.      Relevant Orders   Comprehensive metabolic panel    Senile purpura (HCC)    Ongoing.  Recommend gentle skin care at home and monitor for skin breakdown, alert provider if present.          Other   Hyperlipidemia    No current medications.  No lipid panel today.  Patient age 85, risk vs benefit of medication with advanced age discussed and 10 year risk score discussed.       Other Visit Diagnoses    Encounter for annual physical exam    -  Primary   Relevant Orders   CBC with Differential/Platelet   TSH   Vitamin D deficiency       History of low levels, no supplement.  Recheck level and add supplement as needed.   Relevant Orders   VITAMIN D 25 Hydroxy (Vit-D Deficiency, Fractures)       Follow up plan: Return in about 6 months (around 01/31/2020) for HTN/HLD.   LABORATORY TESTING:  - Pap smear: not applicable  IMMUNIZATIONS:   - Tdap: Tetanus vaccination status reviewed: last tetanus booster within 10 years. - Influenza: Up to date - Pneumovax: Up to date - Prevnar: Up to date - HPV: Not applicable - Zostavax vaccine: Refused  SCREENING: -Mammogram: Not applicable  - Colonoscopy: Not applicable  - Bone Density: Refused  -Hearing Test: Not applicable  -Spirometry: Not applicable   PATIENT COUNSELING:   Advised to take 1 mg of folate supplement per day if capable of pregnancy.   Sexuality: Discussed sexually transmitted diseases, partner selection, use of condoms, avoidance of unintended pregnancy  and contraceptive alternatives.   Advised to avoid cigarette smoking.  I discussed with the patient that most people either abstain from alcohol or drink within safe limits (<=14/week and <=4 drinks/occasion for males, <=7/weeks and <= 3 drinks/occasion for females) and that the risk for alcohol disorders and other health effects rises proportionally with the number of drinks per week and how often a drinker exceeds daily limits.  Discussed cessation/primary prevention of drug use and availability of treatment for  abuse.   Diet: Encouraged to adjust caloric intake to maintain  or achieve ideal body weight, to reduce intake of dietary saturated fat and total fat, to limit sodium intake by avoiding high sodium foods and not adding table salt, and to maintain adequate dietary potassium and calcium preferably from fresh fruits, vegetables, and low-fat dairy products.    stressed the importance of regular exercise  Injury prevention: Discussed safety belts, safety helmets, smoke detector, smoking  near bedding or upholstery.   Dental health: Discussed importance of regular tooth brushing, flossing, and dental visits.    NEXT PREVENTATIVE PHYSICAL DUE IN 1 YEAR. Return in about 6 months (around 01/31/2020) for HTN/HLD.

## 2019-08-03 NOTE — Assessment & Plan Note (Signed)
Chronic, stable.  BP at goal for age.  Continue current medication regimen and adjust as needed.  Labs today.

## 2019-08-03 NOTE — Patient Instructions (Signed)
STOP TAKING BENADRYL AND TAKE MELATONIN 3 to 6 MG AT NIGHT TO HELP YOU SLEEP.  THIS IS SAFER FOR YOUR AGE.  Melatonin oral solid dosage forms What is this medicine? MELATONIN (mel uh TOH nin) is a dietary supplement. It is mostly promoted to help maintain normal sleep patterns. The FDA has not approved this supplement for any medical use. This supplement may be used for other purposes; ask your health care provider or pharmacist if you have questions. This medicine may be used for other purposes; ask your health care provider or pharmacist if you have questions. COMMON BRAND NAME(S): Melatonex What should I tell my health care provider before I take this medicine? They need to know if you have any of these conditions:  cancer  depression or mental illness  diabetes  hormone problems  if you often drink alcohol  immune system problems  liver disease  lung or breathing disease, like asthma  organ transplant  seizure disorder  an unusual or allergic reaction to melatonin, other medicines, foods, dyes, or preservatives  pregnant or trying to get pregnant  breast-feeding How should I use this medicine? Take this supplement by mouth with a glass of water. Do not take with food. This supplement is usually taken 1 or 2 hours before bedtime. After taking this supplement, limit your activities to those needed to prepare for bed. Some products may be chewed or dissolved in the mouth before swallowing. Some tablets or capsules must be swallowed whole; do not cut, crush or chew. Follow the directions on the package labeling, or take as directed by your health care professional. Do not take this supplement more often than directed. Talk to your pediatrician regarding the use of this supplement in children. Special care may be needed. This supplement is not recommended for use in children without a prescription. Overdosage: If you think you have taken too much of this medicine contact a  poison control center or emergency room at once. NOTE: This medicine is only for you. Do not share this medicine with others. What if I miss a dose? If you miss taking your dose at the usual time, skip that dose. If it is almost time for your next dose, take only that dose. Do not take double or extra doses. What may interact with this medicine? Do not take this medicine with any of the following medications:  fluvoxamine  ramelteon  tasimelteon This medicine may also interact with the following medications:  alcohol  caffeine  carbamazepine  certain antibiotics like ciprofloxacin  certain medicines for depression, anxiety, or psychotic disturbances  cimetidine  female hormones, like estrogens and birth control pills, patches, rings, or injections  methoxsalen  nifedipine  other medications for sleep  other herbal or dietary supplements  phenobarbital  rifampin  smoking tobacco  tamoxifen  warfarin This list may not describe all possible interactions. Give your health care provider a list of all the medicines, herbs, non-prescription drugs, or dietary supplements you use. Also tell them if you smoke, drink alcohol, or use illegal drugs. Some items may interact with your medicine. What should I watch for while using this medicine? See your doctor if your symptoms do not get better or if they get worse. Do not take this supplement for more than 2 weeks unless your doctor tells you to. You may get drowsy or dizzy. Do not drive, use machinery, or do anything that needs mental alertness until you know how this medicine affects you. Do not stand  or sit up quickly, especially if you are an older patient. This reduces the risk of dizzy or fainting spells. Alcohol may interfere with the effect of this medicine. Avoid alcoholic drinks. After taking this medicine, you may get up out of bed and do an activity that you do not know you are doing. The next morning, you may have no  memory of this. Activities include driving a car ("sleep-driving"), making and eating food, talking on the phone, sexual activity, and sleep-walking. Serious injuries have occurred. Call your doctor right away if you find out you have done any of these activities. Do not take this medicine if you have used alcohol that evening. Do not take it if you have taken another medicine for sleep. The risk of doing these sleep-related activities is higher. Talk to your doctor before you use this supplement if you are currently being treated for an emotional, mental, or sleep problem. This medicine may interfere with your treatment. Herbal or dietary supplements are not regulated like medicines. Rigid quality control standards are not required for dietary supplements. The purity and strength of these products can vary. The safety and effect of this dietary supplement for a certain disease or illness is not well known. This product is not intended to diagnose, treat, cure or prevent any disease. The Food and Drug Administration suggests the following to help consumers protect themselves:  Always read product labels and follow directions.  Natural does not mean a product is safe for humans to take.  Look for products that include USP after the ingredient name. This means that the manufacturer followed the standards of the [place] Pharmacopoeia.  Supplements made or sold by a nationally known food or drug company are more likely to be made under tight controls. You can write to the company for more information about how the product was made. What side effects may I notice from receiving this medicine? Side effects that you should report to your doctor or health care professional as soon as possible:  allergic reactions like skin rash, itching or hives, swelling of the face, lips, or tongue  breathing problems  confusion  depressed mood, irritable, or other changes in moods or behaviors  feeling faint or  lightheaded, falls  increased blood pressure  irregular or missed menstrual periods  signs and symptoms of liver injury like dark yellow or brown urine; general ill feeling or flu-like symptoms; light-colored stools; loss of appetite; nausea; right upper belly pain; unusually weak or tired; yellowing of the eyes or skin  trouble staying awake or alert during the day  unusual activities while you are still asleep like driving, eating, making phone calls  unusual bleeding or bruising Side effects that usually do not require medical attention (report to your doctor or health care professional if they continue or are bothersome):  dizziness  drowsiness  headache  hot flashes  nausea  tiredness  unusual dreams or nightmares  upset stomach This list may not describe all possible side effects. Call your doctor for medical advice about side effects. You may report side effects to FDA at 1-800-FDA-1088. Where should I keep my medicine? Keep out of the reach of children. Store at room temperature or as directed on the package label. Protect from moisture. Throw away any unused supplement after the expiration date. NOTE: This sheet is a summary. It may not cover all possible information. If you have questions about this medicine, talk to your doctor, pharmacist, or health care provider.  2020  Elsevier/Gold Standard (2018-04-25 12:11:58)

## 2019-08-03 NOTE — Assessment & Plan Note (Addendum)
Ongoing.  Recommend gentle skin care at home and monitor for skin breakdown, alert provider if present.

## 2019-08-03 NOTE — Progress Notes (Signed)
Subjective:   Rhonda Ingram is a 85 y.o. female who presents for Medicare Annual (Subsequent) preventive examination.  Review of Systems:   Cardiac Risk Factors include: advanced age (>50men, >58 women);hypertension;dyslipidemia     Objective:     Vitals: BP (!) 145/70 (BP Location: Left Arm, Patient Position: Sitting, Cuff Size: Normal)   Pulse 84   Temp 98 F (36.7 C) (Oral)   Resp 15   Ht 5' (1.524 m)   Wt 113 lb (51.3 kg)   LMP  (LMP Unknown)   BMI 22.07 kg/m   Body mass index is 22.07 kg/m.  Advanced Directives 07/30/2018 07/03/2018 07/24/2017 10/02/2016 06/27/2016  Does Patient Have a Medical Advance Directive? No No No No No  Would patient like information on creating a medical advance directive? Yes (MAU/Ambulatory/Procedural Areas - Information given) No - Patient declined Yes (MAU/Ambulatory/Procedural Areas - Information given) No - Patient declined -    Tobacco Social History   Tobacco Use  Smoking Status Never Smoker  Smokeless Tobacco Never Used     Counseling given: Not Answered   Clinical Intake:  Pre-visit preparation completed: Yes  Pain : No/denies pain     Nutritional Risks: None Diabetes: No     Interpreter Needed?: No  Information entered by :: Tiffany Hill,LPN  Past Medical History:  Diagnosis Date  . Arthritis    osteoarthritis  . Hyperlipidemia   . Hypertension   . Osteoporosis   . Vitamin D deficiency    Past Surgical History:  Procedure Laterality Date  . CHOLECYSTECTOMY    . EYE SURGERY     cataract extraction  . VENTRAL HERNIA REPAIR N/A 10/08/2016   Procedure: HERNIA REPAIR VENTRAL ADULT;  Surgeon: Christene Lye, MD;  Location: [facility] ORS;  Service: General;  Laterality: N/A;   Family History  Problem Relation Age of Onset  . Hypertension Mother   . Stroke Mother   . Cancer Father        lung  . Hypertension Sister   . Hyperlipidemia Sister   . Hypertension Daughter   . Cerebral palsy Daughter   .  Hypertension Maternal Grandmother   . Hypertension Maternal Grandfather   . Hypertension Paternal Grandmother   . Hypertension Paternal Grandfather   . Hypertension Sister   . Hyperlipidemia Sister   . Migraines Daughter   . Diabetes Brother   . Hypertension Brother   . Hyperlipidemia Brother    Social History   Socioeconomic History  . Marital status: Divorced    Spouse name: Not on file  . Number of children: Not on file  . Years of education: Not on file  . Highest education level: 7th grade  Occupational History  . Occupation: retired   Scientific laboratory technician  . Financial resource strain: Somewhat hard  . Food insecurity    Worry: Never true    Inability: Never true  . Transportation needs    Medical: No    Non-medical: No  Tobacco Use  . Smoking status: Never Smoker  . Smokeless tobacco: Never Used  Substance and Sexual Activity  . Alcohol use: No  . Drug use: No  . Sexual activity: Never  Lifestyle  . Physical activity    Days per week: 0 days    Minutes per session: 0 min  . Stress: Not at all  Relationships  . Social connections    Talks on phone: More than three times a week    Gets together: More than three times  a week    Attends religious service: Never    Active member of club or organization: No    Attends meetings of clubs or organizations: Never    Relationship status: Widowed  Other Topics Concern  . Not on file  Social History Narrative  . Not on file    Outpatient Encounter Medications as of 08/03/2019  Medication Sig  . benazepril (LOTENSIN) 5 MG tablet TAKE 1 TABLET EVERY DAY  . diphenhydrAMINE (BENADRYL) 25 MG tablet Take 25 mg by mouth every 6 (six) hours as needed. Taking 1/2 tablet to 1 tablet prn at night for sleep  . fluticasone (FLONASE) 50 MCG/ACT nasal spray Place 1-2 sprays into both nostrils daily as needed (for allergies.).   [name] triamcinolone cream (KENALOG) 0.1 % APPLY TWICE DAILY AS DIRECTED (Patient not taking: Reported on 08/03/2019)   . [DISCONTINUED] omeprazole (PRILOSEC) 20 MG capsule Take 1 capsule (20 mg total) by mouth daily. (Patient not taking: Reported on 08/03/2019)   No facility-administered encounter medications on file as of 08/03/2019.     Activities of Daily Living In your present state of health, do you have any difficulty performing the following activities: 08/03/2019  Hearing? Y  Comment difficulty with right ear, no hearing aids  Vision? Y  Comment eyeglasses, [email or web address]  Difficulty concentrating or making decisions? Y  Walking or climbing stairs? Y  Dressing or bathing? N  Doing errands, shopping? N  Preparing Food and eating ? N  Using the Toilet? N  In the past six months, have you accidently leaked urine? N  Do you have problems with loss of bowel control? N  Managing your Medications? N  Managing your Finances? N  Housekeeping or managing your Housekeeping? N  Some recent data might be hidden    Patient Care Team: Steele Sizer, MD as PCP - General (Family Medicine) Steele Sizer, MD (Family Medicine)    Assessment:   This is a routine wellness examination for [facility].  Exercise Activities and Dietary recommendations Current Exercise Habits: The patient does not participate in regular exercise at present, Exercise limited by: None identified  Goals    . DIET - INCREASE WATER INTAKE     Recommend drinking at least 6-8 glasses of water a day        Fall Risk: Fall Risk  08/03/2019 11/24/2018 07/30/2018 07/09/2018 03/18/2018  Falls in the past year? 1 0 No No No  Number falls in past yr: 0 0 - - -  Injury with Fall? 0 0 - - -  Follow up - Falls evaluation completed - - -    FALL RISK PREVENTION PERTAINING TO THE HOME:  Any stairs in or around the home? No yes ramp with handrail  If so, are there any without handrails? No   Home free of loose throw rugs in walkways, pet beds, electrical cords, etc? Yes  Adequate lighting in your home to reduce risk of falls? Yes    ASSISTIVE DEVICES UTILIZED TO PREVENT FALLS:  Life alert? No  Use of a cane, walker or w/c? No  Grab bars in the bathroom? No  Shower chair or bench in shower? No  Elevated toilet seat or a handicapped toilet? Yes   DME ORDERS:  DME order needed?  No   TIMED UP AND GO:  Was the test performed? Yes .  Length of time to ambulate 10 feet: 11 sec.   GAIT:  Appearance of gait: Gait steady and fast without the use  of an assistive device.  Education: Fall risk prevention has been discussed.  Intervention(s) required? No   DME/home health order needed?  No    Depression Screen PHQ 2/9 Scores 08/03/2019 07/30/2018 03/18/2018 08/20/2017  PHQ - 2 Score 0 0 0 0     Cognitive Function     6CIT Screen 08/03/2019 07/30/2018 07/24/2017  What Year? 0 points 0 points 0 points  What month? 0 points 0 points 0 points  What time? 0 points 0 points 0 points  Count back from 20 0 points 0 points 0 points  Months in reverse 0 points 0 points 0 points  Repeat phrase 2 points 2 points 2 points  Total Score 2 2 2     Immunization History  Administered Date(s) Administered  . Fluad Quad(high Dose 65+) 08/03/2019  . Influenza, High Dose Seasonal PF 08/15/2016, 07/24/2017, 07/30/2018  . Influenza,inj,Quad PF,6+ Mos 08/02/2015  . Influenza-Unspecified 08/12/2014, 08/23/2014  . Pneumococcal Conjugate-13 02/14/2016  . Pneumococcal Polysaccharide-23 08/16/2008  . Td 10/24/2004    Qualifies for Shingles Vaccine? Yes  Zostavax completed n/a. Due for Shingrix. Education has been provided regarding the importance of this vaccine. Pt has been advised to call insurance company to determine out of pocket expense. Advised may also receive vaccine at local pharmacy or Health Dept. Verbalized acceptance and understanding.  Tdap: Discussed need for TD/TDAP vaccine, patient verbalized understanding that this is not covered as a preventative with there insurance and to call the office if she develops any new skin  injuries, ie: cuts, scrapes, bug bites, or open wounds.  Flu Vaccine: Due for Flu vaccine. Does the patient want to receive this vaccine today?  Yes   .  Pneumococcal Vaccine: up to date   Screening Tests Health Maintenance  Topic Date Due  . TETANUS/TDAP  08/02/2020 (Originally 10/24/2014)  . INFLUENZA VACCINE  Completed  . DEXA SCAN  Completed  . PNA vac Low Risk Adult  Completed    Cancer Screenings:  Colorectal Screening:no longer required   Mammogram: no longer required   Bone Density: no longer required     Additional Screening:  Hepatitis C Screening: does not qualify  Vision Screening: Recommended annual ophthalmology exams for early detection of glaucoma and other disorders of the eye. Is the patient up to date with their annual eye exam?  Yes  Who is the provider or what is the name of the office in which the pt attends annual eye exams?   Dental Screening: Recommended annual dental exams for proper oral hygiene  Community Resource Referral:  CRR required this visit?  Yes       Plan:  I have personally reviewed and addressed the Medicare Annual Wellness questionnaire and have noted the following in the patient's chart:  A. Medical and social history B. Use of alcohol, tobacco or illicit drugs  C. Current medications and supplements D. Functional ability and status E.  Nutritional status F.  Physical activity G. Advance directives H. List of other physicians I.  Hospitalizations, surgeries, and ER visits in previous 12 months J.  Vitals K. Screenings such as hearing and vision if needed, cognitive and depression L. Referrals and appointments   In addition, I have reviewed and discussed with patient certain preventive protocols, quality metrics, and best practice recommendations. A written personalized care plan for preventive services as well as general preventive health recommendations were provided to patient.  Signed,    Collene SchlichterHill, Tiffany A, LPN   [id number]/2020 Nurse Health Advisor  Nurse Notes: none

## 2019-08-03 NOTE — Patient Instructions (Signed)
Rhonda Ingram , Thank you for taking time to come for your Medicare Wellness Visit. I appreciate your ongoing commitment to your health goals. Please review the following plan we discussed and let me know if I can assist you in the future.   Screening recommendations/referrals: Colonoscopy: no longer required Mammogram: no longer required Bone Density: no longer required Recommended yearly ophthalmology/optometry visit for glaucoma screening and checkup Recommended yearly dental visit for hygiene and checkup  Vaccinations: Influenza vaccine: done today Pneumococcal vaccine: up to date Tdap vaccine: due now, check with your insurance for coverage  Shingles vaccine: shingrix eligible     Advanced directives: Advance directive discussed with you today. I have provided a copy for you to complete at home and have notarized. Once this is complete please bring a copy in to our office so we can scan it into your chart.  Conditions/risks identified: fall prevention discussed   Next appointment: follow up in one year for your annual wellness visit.    Preventive Care 33 Years and Older, Female Preventive care refers to lifestyle choices and visits with your health care provider that can promote health and wellness. What does preventive care include?  A yearly physical exam. This is also called an annual well check.  Dental exams once or twice a year.  Routine eye exams. Ask your health care provider how often you should have your eyes checked.  Personal lifestyle choices, including:  Daily care of your teeth and gums.  Regular physical activity.  Eating a healthy diet.  Avoiding tobacco and drug use.  Limiting alcohol use.  Practicing safe sex.  Taking low-dose aspirin every day.  Taking vitamin and mineral supplements as recommended by your health care provider. What happens during an annual well check? The services and screenings done by your health care provider during  your annual well check will depend on your age, overall health, lifestyle risk factors, and family history of disease. Counseling  Your health care provider may ask you questions about your:  Alcohol use.  Tobacco use.  Drug use.  Emotional well-being.  Home and relationship well-being.  Sexual activity.  Eating habits.  History of falls.  Memory and ability to understand (cognition).  Work and work Statistician.  Reproductive health. Screening  You may have the following tests or measurements:  Height, weight, and BMI.  Blood pressure.  Lipid and cholesterol levels. These may be checked every 5 years, or more frequently if you are over 67 years old.  Skin check.  Lung cancer screening. You may have this screening every year starting at age 40 if you have a 30-pack-year history of smoking and currently smoke or have quit within the past 15 years.  Fecal occult blood test (FOBT) of the stool. You may have this test every year starting at age 23.  Flexible sigmoidoscopy or colonoscopy. You may have a sigmoidoscopy every 5 years or a colonoscopy every 10 years starting at age 2.  Hepatitis C blood test.  Hepatitis B blood test.  Sexually transmitted disease (STD) testing.  Diabetes screening. This is done by checking your blood sugar (glucose) after you have not eaten for a while (fasting). You may have this done every 1-3 years.  Bone density scan. This is done to screen for osteoporosis. You may have this done starting at age 60.  Mammogram. This may be done every 1-2 years. Talk to your health care provider about how often you should have regular mammograms. Talk with your  health care provider about your test results, treatment options, and if necessary, the need for more tests. Vaccines  Your health care provider may recommend certain vaccines, such as:  Influenza vaccine. This is recommended every year.  Tetanus, diphtheria, and acellular pertussis (Tdap,  Td) vaccine. You may need a Td booster every 10 years.  Zoster vaccine. You may need this after age 61.  Pneumococcal 13-valent conjugate (PCV13) vaccine. One dose is recommended after age 54.  Pneumococcal polysaccharide (PPSV23) vaccine. One dose is recommended after age 57. Talk to your health care provider about which screenings and vaccines you need and how often you need them. This information is not intended to replace advice given to you by your health care provider. Make sure you discuss any questions you have with your health care provider. Document Released: 11/25/2015 Document Revised: 07/18/2016 Document Reviewed: 08/30/2015 Elsevier Interactive Patient Education  2017 [place] Prevention in the Home Falls can cause injuries. They can happen to people of all ages. There are many things you can do to make your home safe and to help prevent falls. What can I do on the outside of my home?  Regularly fix the edges of walkways and driveways and fix any cracks.  Remove anything that might make you trip as you walk through a door, such as a raised step or threshold.  Trim any bushes or trees on the path to your home.  Use bright outdoor lighting.  Clear any walking paths of anything that might make someone trip, such as rocks or tools.  Regularly check to see if handrails are loose or broken. Make sure that both sides of any steps have handrails.  Any raised decks and porches should have guardrails on the edges.  Have any leaves, snow, or ice cleared regularly.  Use sand or salt on walking paths during winter.  Clean up any spills in your garage right away. This includes oil or grease spills. What can I do in the bathroom?  Use night lights.  Install grab bars by the toilet and in the tub and shower. Do not use towel bars as grab bars.  Use non-skid mats or decals in the tub or shower.  If you need to sit down in the shower, use a plastic, non-slip stool.   Keep the floor dry. Clean up any water that spills on the floor as soon as it happens.  Remove soap buildup in the tub or shower regularly.  Attach bath mats securely with double-sided non-slip rug tape.  Do not have throw rugs and other things on the floor that can make you trip. What can I do in the bedroom?  Use night lights.  Make sure that you have a light by your bed that is easy to reach.  Do not use any sheets or blankets that are too big for your bed. They should not hang down onto the floor.  Have a firm chair that has side arms. You can use this for support while you get dressed.  Do not have throw rugs and other things on the floor that can make you trip. What can I do in the kitchen?  Clean up any spills right away.  Avoid walking on wet floors.  Keep items that you use a lot in easy-to-reach places.  If you need to reach something above you, use a strong step stool that has a grab bar.  Keep electrical cords out of the way.  Do not use  floor polish or wax that makes floors slippery. If you must use wax, use non-skid floor wax.  Do not have throw rugs and other things on the floor that can make you trip. What can I do with my stairs?  Do not leave any items on the stairs.  Make sure that there are handrails on both sides of the stairs and use them. Fix handrails that are broken or loose. Make sure that handrails are as long as the stairways.  Check any carpeting to make sure that it is firmly attached to the stairs. Fix any carpet that is loose or worn.  Avoid having throw rugs at the top or bottom of the stairs. If you do have throw rugs, attach them to the floor with carpet tape.  Make sure that you have a light switch at the top of the stairs and the bottom of the stairs. If you do not have them, ask someone to add them for you. What else can I do to help prevent falls?  Wear shoes that:  Do not have high heels.  Have rubber bottoms.  Are  comfortable and fit you well.  Are closed at the toe. Do not wear sandals.  If you use a stepladder:  Make sure that it is fully opened. Do not climb a closed stepladder.  Make sure that both sides of the stepladder are locked into place.  Ask someone to hold it for you, if possible.  Clearly mark and make sure that you can see:  Any grab bars or handrails.  First and last steps.  Where the edge of each step is.  Use tools that help you move around (mobility aids) if they are needed. These include:  Canes.  Walkers.  Scooters.  Crutches.  Turn on the lights when you go into a dark area. Replace any light bulbs as soon as they burn out.  Set up your furniture so you have a clear path. Avoid moving your furniture around.  If any of your floors are uneven, fix them.  If there are any pets around you, be aware of where they are.  Review your medicines with your doctor. Some medicines can make you feel dizzy. This can increase your chance of falling. Ask your doctor what other things that you can do to help prevent falls. This information is not intended to replace advice given to you by your health care provider. Make sure you discuss any questions you have with your health care provider. Document Released: 08/25/2009 Document Revised: 04/05/2016 Document Reviewed: 12/03/2014 Elsevier Interactive Patient Education  2017 [employer].

## 2019-08-04 LAB — CBC WITH DIFFERENTIAL/PLATELET
Basophils Absolute: 0.1 10*3/uL (ref 0.0–0.2)
Basos: 1 %
EOS (ABSOLUTE): 0.1 10*3/uL (ref 0.0–0.4)
Eos: 2 %
Hematocrit: 41.6 % (ref 34.0–46.6)
Hemoglobin: 13.5 g/dL (ref 11.1–15.9)
Immature Grans (Abs): 0 10*3/uL (ref 0.0–0.1)
Immature Granulocytes: 0 %
Lymphocytes Absolute: 1.8 10*3/uL (ref 0.7–3.1)
Lymphs: 29 %
MCH: 28.1 pg (ref 26.6–33.0)
MCHC: 32.5 g/dL (ref 31.5–35.7)
MCV: 87 fL (ref 79–97)
Monocytes Absolute: 0.7 10*3/uL (ref 0.1–0.9)
Monocytes: 11 %
Neutrophils Absolute: 3.5 10*3/uL (ref 1.4–7.0)
Neutrophils: 57 %
Platelets: 228 10*3/uL (ref 150–450)
RBC: 4.81 x10E6/uL (ref 3.77–5.28)
RDW: 12.9 % (ref 11.7–15.4)
WBC: 6.1 10*3/uL (ref 3.4–10.8)

## 2019-08-04 LAB — COMPREHENSIVE METABOLIC PANEL
ALT: 8 IU/L (ref 0–32)
AST: 21 IU/L (ref 0–40)
Albumin/Globulin Ratio: 1 — ABNORMAL LOW (ref 1.2–2.2)
Albumin: 3.8 g/dL (ref 3.5–4.6)
Alkaline Phosphatase: 69 IU/L (ref 39–117)
BUN/Creatinine Ratio: 28 (ref 12–28)
BUN: 23 mg/dL (ref 10–36)
Bilirubin Total: <0.2 mg/dL (ref 0.0–1.2)
CO2: 24 mmol/L (ref 20–29)
Calcium: 9.7 mg/dL (ref 8.7–10.3)
Chloride: 101 mmol/L (ref 96–106)
Creatinine, Ser: 0.83 mg/dL (ref 0.57–1.00)
GFR calc Af Amer: 71 mL/min/{1.73_m2} (ref >59)
GFR calc non Af Amer: 62 mL/min/{1.73_m2} (ref >59)
Globulin, Total: 3.8 g/dL (ref 1.5–4.5)
Glucose: 98 mg/dL (ref 65–99)
Potassium: 4.4 mmol/L (ref 3.5–5.2)
Sodium: 138 mmol/L (ref 134–144)
Total Protein: 7.6 g/dL (ref 6.0–8.5)

## 2019-08-04 LAB — TSH: TSH: 2.64 u[IU]/mL (ref 0.450–4.500)

## 2019-08-04 LAB — VITAMIN D 25 HYDROXY (VIT D DEFICIENCY, FRACTURES): Vit D, 25-Hydroxy: 25.3 ng/mL — ABNORMAL LOW (ref 30.0–100.0)
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
## 2019-09-24 ENCOUNTER — Telehealth: Payer: Self-pay | Admitting: Family Medicine

## 2019-09-24 NOTE — Telephone Encounter (Signed)
OK  Copied from [place] [id number]. Topic: General - Other >> Sep 24, 2019  2:07 PM Rayann Heman wrote: Reason for CRM: pt called and stated that she would like rachel as her new pcp. Please advise

## 2019-09-24 NOTE — Telephone Encounter (Signed)
Patient notified

## 2019-10-21 ENCOUNTER — Telehealth: Payer: Self-pay | Admitting: Family Medicine
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
## 2019-10-21 NOTE — Telephone Encounter (Signed)
° ° ° °  Created 3 referrals to [facility] Referral for pt for [employer] Referral. Will follow up with patient in 7 days after organizations have determined eligibility, completed their outreach, & updated the referral.  Volunteers of [place] of the [place] [facility] [place] Division of [place]  [place] 2-1-1  [place] Management ??[email or web address]   ??[phone]

## 2019-10-27 NOTE — Telephone Encounter (Signed)
12/15 [facility] updates:  [place] Division of [place] - Rejection  Reason for Rejection: We do not provide the services requested/needed Note: The client is needing repairs to her home. Independent Living funding cannot be used solely for repairs. You could check with the Housing Rehabilitation program or Urgent Repair Program for the city of [phone].   Volunteers of [place] - Needs action Note added to Home Expense Assistance/Repairs Referral by [name] on 10/27/2019 at 12:22 pm Note: Good Afternoon, Any updates regarding this referral? Thank you, [name] [phone]  [place] 2-1-1 - Needs Action Note added to Home Expense Assistance/Repairs Referral by [name] on 10/27/2019 at 12:23 pm Note: Good Afternoon, Any updates regarding this referral? Thank you, [name] [phone]
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
## 2019-10-29 NOTE — Telephone Encounter (Signed)
   [place] - phone call with Penne Lash to discuss application for grant for Urgent Repair program they amount is $8000 and she will email me a copy of the online form to be filled out.   [place] . Embedded Care Coordination [place]  Care Management ??[email or web address]  ??[phone]

## 2019-10-29 NOTE — Telephone Encounter (Signed)
    Called pt regarding [employer] Referral for home repairs, began process of applying for [employer] Urgent Repair grant need follow up documents patient will check with her granddaughter and will get back to me Deed to property- pt stated that it was in her daughter's name (daughter is in home in [place] [facility] Copies of Previous Years Tax Returns - pt stated that she has not worked since early 1970s but will double check if they had any family tax returns in her safe Social Security Benefit letter - with statement of monthly amount.  Scheduled follow up for 12/23  knb [place] . Embedded Care Coordination [place]  Care Management ??[email or web address]  ??[phone]

## 2019-11-03 NOTE — Telephone Encounter (Signed)
LMTCB for Rhonda Ingram at [employer] and sent reply to her email   From: [name] [facility])  Sent: Tuesday, November 03, 2019 10:27 AM To: Rhonda Ingram [email or web address]> Subject: SECURE: Assistance with Home Repair  Good Morning Rhonda, Thanks for returning my call, I left you a voicemail this morning and here are a few more details regarding our patient at [facility], could you let me know if there is anything else your agency could offer?  [facility] 85 y/o 09/18/1926 Lives alone [place], [place] [address] On December 17th we had begun the process of applying for [employer] Urgent Repair grant to help her pay for repairs to her home. Crawl space joists have started to rot and her floor is falling in near her living room and part of bedroom and bath. Patient needs these follow up documents & will check with her granddaughter and will get back to me -Deed to property- pt stated that it was in her daughter's name (daughter is in home in [employer] [place] of Previous Years Tax Returns - pt stated that she has not worked since early 1970s but will double check if they had any family tax returns in her safe -Social Security Benefit letter - with statement of monthly amount.  Thanks so much and let me know if there is any more detail info that you may need.    [place] . Embedded Care Coordination [place]  Care Management ??[email or web address]  ??[phone]

## 2019-11-17 NOTE — Telephone Encounter (Signed)
Pt states that she needs to speak with Samara Deist to find out more is needed from her for repairs.

## 2019-11-20 ENCOUNTER — Telehealth: Payer: Self-pay

## 2019-11-20 NOTE — Telephone Encounter (Signed)
Copied from CRM [id number]. Topic: General - Inquiry >> Nov 20, 2019  3:45 PM Rhonda Ingram wrote: Reason for CRM: Pt called to see if Fleet Contras thinks she should get the covid vaccine. 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

## 2019-11-22 NOTE — Telephone Encounter (Signed)
I do not see a contraindication to her getting the vaccine

## 2019-11-23 NOTE — Telephone Encounter (Signed)
Patient notified

## 2019-12-04 ENCOUNTER — Telehealth: Payer: Self-pay | Admitting: Family Medicine

## 2019-12-04 NOTE — Telephone Encounter (Signed)
Spoke with Esaw Grandchild of [facility] she stated she will reach out to the pt this afternoon. Closing referral pending any other needs of patient.  [name]  Care Guide . Embedded Care Coordination   Care Management ??[name]  ??[phone]

## 2019-12-04 NOTE — Telephone Encounter (Signed)
    Called pt regarding [employer] Referral for home repairs. Pt stated that she had not heard from [id number]. Will follow up with Jon Gills to have her reach back out to Ms. Carpino  _________________________ Peri Jefferson evening Corrie Dandy, My name is Alexis with [id number], I am a Barista for [place]. It is my job to ensure that residents of the community receive community agency information that can assist with their crisis. I received your referral for home reparis assistance to your floors, and I want to start the referral process as soon as possible. I will be contacting you soon. ThanksJon Gills [id number] resource navigator [phone] Please do not respond to this message. To get in touch with Esaw Grandchild, please call [phone] Home Expense Assistance/Repairs Referral Held For Review by Esaw Grandchild on 11/17/2019 at 4:07 pm Reason: Conducting additional screening/follow-up Note: Hold for review. Note added to Home Expense Assistance/Repairs Referral by Esaw Grandchild on 11/17/2019 at 4:08 pm Interaction Type: Email Date: 11/17/2019 Note: 1st attempt to contact client to initiate referral.    [name]  Care Guide . Embedded Care Coordination [place]  Care Management ??[name]  ??[phone]

## 2019-12-04 NOTE — Telephone Encounter (Signed)
, °

## 2020-01-13 ENCOUNTER — Telehealth: Payer: Self-pay | Admitting: Family Medicine

## 2020-01-13 NOTE — Telephone Encounter (Signed)
   01/13/2020  Name: Rhonda Ingram   MRN: [id number]   DOB: December 28, 1925   AGE: 86 y.o.   GENDER: female   PCP Particia Nearing, PA-C.   Called pt regarding Community Resource Referral for Home repair Crawl space joists have started to rot and her floor is falling in near her living room and part of bedroom and bath. Pt stated that she believes she has copy of deed and social security statement so that we can apply for [employer]. Will have her granddaughter Neysa Bonito call me back later to discuss and make sure she has everything she needs and arrange drop off of documents.  knb   [employer]  Care Guide . Embedded Care Coordination [facility] Management [name]  [phone]

## 2020-01-14 NOTE — Telephone Encounter (Signed)
   01/14/2020  Name: Rhonda Ingram   MRN: [id number]   DOB: [birth year]   AGE: 86 y.o.   GENDER: female   PCP Particia Nearing, PA-C.   Called pt's granddaughter regarding Publishing rights manager Referral for [employer] and documents needed. Ms Rhonda Ingram stated she would work with her grandmother to get copies emailed to me so that we can apply for the funding to help with home repairs.   [name]  Care Guide . Embedded Care Coordination [facility] Management [name]  [phone]

## 2020-01-14 NOTE — Telephone Encounter (Signed)
Email to Patient's Granddaughter From: [name] [facility])  Sent: Thursday, January 14, 2020 9:46 AM To: [email or web address] Subject: Secure: Community Resource Referral - Home Repairs Ms. [employer]   Good Morning [name], Thank you for speaking me today in regards to the documents for Ms. Kirchoff.  Could you please email me a copy of the deed to her home as well as her most recent Social Security statement or benefit letter with monthly amount? Once I receive those, I will reply back to you and then submit to the [employer] for the grant for home repairs.  Please let me know if you have any questions and I look forward to hearing from you.   [name]  Care Guide . Embedded Care Coordination [place]  Care Management ??[name]  ??[phone]

## 2020-01-15 NOTE — Telephone Encounter (Signed)
   01/15/2020  Name: Rhonda Ingram   MRN: [id number]   DOB: Nov 30, 1925   AGE: 86 y.o.   GENDER: female   PCP Particia Nearing, PA-C.   Called pt's  regarding [employer] Referral for home repairs. Confirmed on vm that I had received email and to please call back to schedule a time for [place] to complete the application for [facility] grants over the phone.  LMTCB  [employer]  Care Guide . Embedded Care Coordination [facility] Management [name]  [phone]

## 2020-01-18 NOTE — Telephone Encounter (Signed)
Email to Pt's Granddaughter From: [name] [facility])  Sent: Monday, January 18, 2020 4:41 PM To: Kendrick [email or web address]> Subject: Secure: [facility] Application  Good Afternoon [name], Thank you for your assistance today in providing the documentation and information to complete the [facility] [employer].  Will you please send me the following in order to complete the Housing Rehabilitation application  -2 Months of Bank Statements for [employer] -Copy of Tax Annette Stable (paid) -Credit Card Statements -[employer] Bill -Financial trader -[employer] -Pictures of the damage to the house. Once I receive those documents from you we can resume and submit the application.  Thank you, [name]  Care Guide  Embedded Care Coordination [facility] Management [name]   [phone]      From: Kendrick Ranch [email or web address]>  Sent: Thursday, January 14, 2020 3:42 PM To: [name] [facility]) [email or web address]> Subject: Carlena Hurl  *Caution - External email - see footer for warnings* WARNING: This email originated outside of [facility]. Even if this looks like a [employer], it is not. Do not provide your username, password, or any other personal information in response to this or any other email. [facility] will never ask you for your username or password via email. DO NOT CLICK links or attachments unless you are positive the content is safe. If in doubt about the safety of this message, select the [employer] Report Phishing button, which forwards to IT Security.

## 2020-01-18 NOTE — Telephone Encounter (Signed)
   01/18/2020  Name: Rhonda Ingram   MRN: [id number]   DOB: [birth year]   AGE: 86 y.o.   GENDER: female   PCP Particia Nearing, PA-C.   01/18/20 Called pt regarding Community [employer] Referral for home. Granddaughter was not available to talk  LMTCB with pt  [name]  Care Guide . Embedded Care Coordination [facility] Management [name]  [phone]

## 2020-01-18 NOTE — Telephone Encounter (Signed)
Email to Pt's Granddaughter  From: [name] [facility])  Sent: Friday, January 22, 2020 1:08 PM To: 'Kendrick Ranch' [email or web address]> Subject: RE: Secure Ms[employer] Application  Good Afternoon [name], I just tried your phone # but voicemail box was full  Here is the updated list. Please call me when you are available   Will you please send me the following in order to complete the Housing Rehabilitation application  -2 Months of Bank Statements for [employer] need page 3 of both Jan and Feb statements -Copy of Tax Annette Stable (paid)  still need -Credit Card Statements still need -[employer] Bill Still need -Financial trader received -[employer] received -Pictures of the damage to the house. still need  Once I receive those documents from you we can resume and submit the application.  Thank you, [name]  Care Guide . Embedded Care Coordination [facility] Management [name]  [phone]

## 2020-02-01 ENCOUNTER — Ambulatory Visit (INDEPENDENT_AMBULATORY_CARE_PROVIDER_SITE_OTHER): Payer: Medicare HMO | Admitting: Nurse Practitioner

## 2020-02-01 ENCOUNTER — Other Ambulatory Visit: Payer: Self-pay

## 2020-02-01 ENCOUNTER — Encounter: Payer: Self-pay | Admitting: Nurse Practitioner

## 2020-02-01 VITALS — BP 133/73 | HR 87 | Temp 99.4°F

## 2020-02-01 DIAGNOSIS — I10 Essential (primary) hypertension: Secondary | ICD-10-CM | POA: Diagnosis not present

## 2020-02-01 DIAGNOSIS — E782 Mixed hyperlipidemia: Secondary | ICD-10-CM

## 2020-02-01 DIAGNOSIS — E559 Vitamin D deficiency, unspecified: Secondary | ICD-10-CM | POA: Diagnosis not present

## 2020-02-01 NOTE — Assessment & Plan Note (Signed)
No current medications.  No lipid panel today per patient request, this is appropriate.  Patient age 86, risk vs benefit of medication with advanced age discussed and 10 year risk score discussed.

## 2020-02-01 NOTE — Assessment & Plan Note (Signed)
Chronic, stable.  BP at goal for age.  Continue current medication regimen and adjust as needed.  Labs today, BMP.

## 2020-02-01 NOTE — Patient Instructions (Signed)
STOP TAKING BENADRYL -- start taking Claritin or Allegra as needed for allergies.  DASH Eating Plan DASH stands for "Dietary Approaches to Stop Hypertension." The DASH eating plan is a healthy eating plan that has been shown to reduce high blood pressure (hypertension). It may also reduce your risk for type 2 diabetes, heart disease, and stroke. The DASH eating plan may also help with weight loss. What are tips for following this plan?  General guidelines  Avoid eating more than 2,300 mg (milligrams) of salt (sodium) a day. If you have hypertension, you may need to reduce your sodium intake to 1,500 mg a day.  Limit alcohol intake to no more than 1 drink a day for nonpregnant women and 2 drinks a day for men. One drink equals 12 oz of beer, 5 oz of wine, or 1 oz of hard liquor.  Work with your health care provider to maintain a healthy body weight or to lose weight. Ask what an ideal weight is for you.  Get at least 30 minutes of exercise that causes your heart to beat faster (aerobic exercise) most days of the week. Activities may include walking, swimming, or biking.  Work with your health care provider or diet and nutrition specialist (dietitian) to adjust your eating plan to your individual calorie needs. Reading food labels   Check food labels for the amount of sodium per serving. Choose foods with less than 5 percent of the Daily Value of sodium. Generally, foods with less than 300 mg of sodium per serving fit into this eating plan.  To find whole grains, look for the word "whole" as the first word in the ingredient list. Shopping  Buy products labeled as "low-sodium" or "no salt added."  Buy fresh foods. Avoid canned foods and premade or frozen meals. Cooking  Avoid adding salt when cooking. Use salt-free seasonings or herbs instead of table salt or sea salt. Check with your health care provider or pharmacist before using salt substitutes.  Do not fry foods. Cook foods using  healthy methods such as baking, boiling, grilling, and broiling instead.  Cook with heart-healthy oils, such as olive, canola, soybean, or sunflower oil. Meal planning  Eat a balanced diet that includes: ? 5 or more servings of fruits and vegetables each day. At each meal, try to fill half of your plate with fruits and vegetables. ? Up to 6-8 servings of whole grains each day. ? Less than 6 oz of lean meat, poultry, or fish each day. A 3-oz serving of meat is about the same size as a deck of cards. One egg equals 1 oz. ? 2 servings of low-fat dairy each day. ? A serving of nuts, seeds, or beans 5 times each week. ? Heart-healthy fats. Healthy fats called Omega-3 fatty acids are found in foods such as flaxseeds and coldwater fish, like sardines, salmon, and mackerel.  Limit how much you eat of the following: ? Canned or prepackaged foods. ? Food that is high in trans fat, such as fried foods. ? Food that is high in saturated fat, such as fatty meat. ? Sweets, desserts, sugary drinks, and other foods with added sugar. ? Full-fat dairy products.  Do not salt foods before eating.  Try to eat at least 2 vegetarian meals each week.  Eat more home-cooked food and less restaurant, buffet, and fast food.  When eating at a restaurant, ask that your food be prepared with less salt or no salt, if possible. What foods are recommended?  The items listed may not be a complete list. Talk with your dietitian about what dietary choices are best for you. Grains Whole-grain or whole-wheat bread. Whole-grain or whole-wheat pasta. Brown rice. [place]. Bulgur. Whole-grain and low-sodium cereals. Pita bread. Low-fat, low-sodium crackers. Whole-wheat flour tortillas. Vegetables Fresh or frozen vegetables (raw, steamed, roasted, or grilled). Low-sodium or reduced-sodium tomato and vegetable juice. Low-sodium or reduced-sodium tomato sauce and tomato paste. Low-sodium or reduced-sodium canned  vegetables. Fruits All fresh, dried, or frozen fruit. Canned fruit in natural juice (without added sugar). Meat and other protein foods Skinless chicken or [place]. Ground chicken or [place]. Pork with fat trimmed off. Fish and seafood. Egg whites. Dried beans, peas, or lentils. Unsalted nuts, nut butters, and seeds. Unsalted canned beans. Lean cuts of beef with fat trimmed off. Low-sodium, lean deli meat. Dairy Low-fat (1%) or fat-free (skim) milk. Fat-free, low-fat, or reduced-fat cheeses. Nonfat, low-sodium ricotta or cottage cheese. Low-fat or nonfat yogurt. Low-fat, low-sodium cheese. Fats and oils Soft margarine without trans fats. Vegetable oil. Low-fat, reduced-fat, or light mayonnaise and salad dressings (reduced-sodium). Canola, safflower, olive, soybean, and sunflower oils. Avocado. Seasoning and other foods Herbs. Spices. Seasoning mixes without salt. Unsalted popcorn and pretzels. Fat-free sweets. What foods are not recommended? The items listed may not be a complete list. Talk with your dietitian about what dietary choices are best for you. Grains Baked goods made with fat, such as croissants, muffins, or some breads. Dry pasta or rice meal packs. Vegetables Creamed or fried vegetables. Vegetables in a cheese sauce. Regular canned vegetables (not low-sodium or reduced-sodium). Regular canned tomato sauce and paste (not low-sodium or reduced-sodium). Regular tomato and vegetable juice (not low-sodium or reduced-sodium). Angie Fava. Olives. Fruits Canned fruit in a light or heavy syrup. Fried fruit. Fruit in cream or butter sauce. Meat and other protein foods Fatty cuts of meat. Ribs. Fried meat. Berniece Salines. Sausage. Bologna and other processed lunch meats. Salami. Fatback. Hotdogs. Bratwurst. Salted nuts and seeds. Canned beans with added salt. Canned or smoked fish. Whole eggs or egg yolks. Chicken or [place] with skin. Dairy Whole or 2% milk, cream, and half-and-half. Whole or full-fat  cream cheese. Whole-fat or sweetened yogurt. Full-fat cheese. Nondairy creamers. Whipped toppings. Processed cheese and cheese spreads. Fats and oils Butter. Stick margarine. Lard. Shortening. Ghee. Bacon fat. Tropical oils, such as coconut, palm kernel, or palm oil. Seasoning and other foods Salted popcorn and pretzels. Onion salt, garlic salt, seasoned salt, table salt, and sea salt. Worcestershire sauce. Tartar sauce. Barbecue sauce. Teriyaki sauce. Soy sauce, including reduced-sodium. Steak sauce. Canned and packaged gravies. Fish sauce. Oyster sauce. Cocktail sauce. Horseradish that you find on the shelf. Ketchup. Mustard. Meat flavorings and tenderizers. Bouillon cubes. Hot sauce and Tabasco sauce. Premade or packaged marinades. Premade or packaged taco seasonings. Relishes. Regular salad dressings. Where to find more information:  National Heart, Lung, and [place]: https://wilson-eaton.com/  American Heart Association: www.heart.org Summary  The DASH eating plan is a healthy eating plan that has been shown to reduce high blood pressure (hypertension). It may also reduce your risk for type 2 diabetes, heart disease, and stroke.  With the DASH eating plan, you should limit salt (sodium) intake to 2,300 mg a day. If you have hypertension, you may need to reduce your sodium intake to 1,500 mg a day.  When on the DASH eating plan, aim to eat more fresh fruits and vegetables, whole grains, lean proteins, low-fat dairy, and heart-healthy fats.  Work with your health care  provider or diet and nutrition specialist (dietitian) to adjust your eating plan to your individual calorie needs. This information is not intended to replace advice given to you by your health care provider. Make sure you discuss any questions you have with your health care provider. Document Revised: 10/11/2017 Document Reviewed: 10/22/2016 Elsevier Patient Education  2020 [employer].

## 2020-02-01 NOTE — Progress Notes (Signed)
BP 133/73 (BP Location: Left Arm, Cuff Size: Normal)   Pulse 87   Temp 99.4 F (37.4 C) (Oral)   LMP  (LMP Unknown)   SpO2 97%    Subjective:    Patient ID: Rhonda Ingram, female    DOB: [birth year], 86 y.o.   MRN: [id number]  HPI: Rhonda Ingram is a 86 y.o. female  Chief Complaint  Patient presents with  . Hypertension   HYPERTENSION/HLD Continues on Benazepril 5 MG daily.  Does endorse some stressors on occasion.  No current medications for HLD.  Did have a Vitamin D level on lower side at 25.3 on last exam, taking daily supplement. Hypertension status: stable  Satisfied with current treatment? yes Duration of hypertension: chronic BP monitoring frequency:  not checking BP range:  BP medication side effects:  no Medication compliance: good compliance Aspirin: no Recurrent headaches: no Visual changes: no Palpitations: no Dyspnea: no Chest pain: no Lower extremity edema: no Dizzy/lightheaded: no  The ASCVD Risk score Mikey Bussing DC Jr., et al., 2013) failed to calculate for the following reasons:   The 2013 ASCVD risk score is only valid for ages 73 to 29  Relevant past medical, surgical, family and social history reviewed and updated as indicated. Interim medical history since our last visit reviewed. Allergies and medications reviewed and updated.  Review of Systems  Constitutional: Negative for activity change, appetite change, diaphoresis, fatigue and fever.  Respiratory: Negative for cough, chest tightness and shortness of breath.   Cardiovascular: Negative for chest pain, palpitations and leg swelling.  Gastrointestinal: Negative.   Endocrine: Negative.   Neurological: Negative.   Psychiatric/Behavioral: Negative.     Per HPI unless specifically indicated above     Objective:    BP 133/73 (BP Location: Left Arm, Cuff Size: Normal)   Pulse 87   Temp 99.4 F (37.4 C) (Oral)   LMP  (LMP Unknown)   SpO2 97%   Wt Readings from Last 3 Encounters:    08/03/19 113 lb (51.3 kg)  08/03/19 113 lb (51.3 kg)  11/24/18 118 lb (53.5 kg)    Physical Exam Vitals and nursing note reviewed.  Constitutional:      General: She is awake. She is not in acute distress.    Appearance: She is well-developed and well-groomed. She is not ill-appearing.  HENT:     Head: Normocephalic.     Right Ear: Hearing normal.     Left Ear: Hearing normal.  Eyes:     General: Lids are normal.        Right eye: No discharge.        Left eye: No discharge.     Conjunctiva/sclera: Conjunctivae normal.     Pupils: Pupils are equal, round, and reactive to light.  Neck:     Thyroid: No thyromegaly.     Vascular: No carotid bruit.  Cardiovascular:     Rate and Rhythm: Normal rate and regular rhythm.     Heart sounds: Normal heart sounds. No murmur. No gallop.   Pulmonary:     Effort: Pulmonary effort is normal. No accessory muscle usage or respiratory distress.     Breath sounds: Normal breath sounds.  Abdominal:     General: Bowel sounds are normal.     Palpations: Abdomen is soft.  Musculoskeletal:     Cervical back: Normal range of motion and neck supple.     Right lower leg: No edema.     Left lower leg: No edema.  Skin:    General: Skin is warm and dry.  Neurological:     Mental Status: She is alert and oriented to person, place, and time.  Psychiatric:        Attention and Perception: Attention normal.        Mood and Affect: Mood normal.        Speech: Speech normal.        Behavior: Behavior normal. Behavior is cooperative.    Results for orders placed or performed in visit on 08/03/19  Comprehensive metabolic panel  Result Value Ref Range   Glucose 98 65 - 99 mg/dL   BUN 23 10 - 36 mg/dL   Creatinine, Ser 3.41 0.57 - 1.00 mg/dL   GFR calc non Af Amer 62 >59 mL/min/1.73   GFR calc Af Amer 71 >59 mL/min/1.73   BUN/Creatinine Ratio 28 12 - 28   Sodium 138 134 - 144 mmol/L   Potassium 4.4 3.5 - 5.2 mmol/L   Chloride 101 96 - 106 mmol/L    CO2 24 20 - 29 mmol/L   Calcium 9.7 8.7 - 10.3 mg/dL   Total Protein 7.6 6.0 - 8.5 g/dL   Albumin 3.8 3.5 - 4.6 g/dL   Globulin, Total 3.8 1.5 - 4.5 g/dL   Albumin/Globulin Ratio 1.0 (L) 1.2 - 2.2   Bilirubin Total <0.2 0.0 - 1.2 mg/dL   Alkaline Phosphatase 69 39 - 117 IU/L   AST 21 0 - 40 IU/L   ALT 8 0 - 32 IU/L  CBC with Differential/Platelet  Result Value Ref Range   WBC 6.1 3.4 - 10.8 x10E3/uL   RBC 4.81 3.77 - 5.28 x10E6/uL   Hemoglobin 13.5 11.1 - 15.9 g/dL   Hematocrit 96.2 22.9 - 46.6 %   MCV 87 79 - 97 fL   MCH 28.1 26.6 - 33.0 pg   MCHC 32.5 31.5 - 35.7 g/dL   RDW 79.8 92.1 - 19.4 %   Platelets 228 150 - 450 x10E3/uL   Neutrophils 57 Not Estab. %   Lymphs 29 Not Estab. %   Monocytes 11 Not Estab. %   Eos 2 Not Estab. %   Basos 1 Not Estab. %   Neutrophils Absolute 3.5 1.4 - 7.0 x10E3/uL   Lymphocytes Absolute 1.8 0.7 - 3.1 x10E3/uL   Monocytes Absolute 0.7 0.1 - 0.9 x10E3/uL   EOS (ABSOLUTE) 0.1 0.0 - 0.4 x10E3/uL   Basophils Absolute 0.1 0.0 - 0.2 x10E3/uL   Immature Granulocytes 0 Not Estab. %   Immature Grans (Abs) 0.0 0.0 - 0.1 x10E3/uL  TSH  Result Value Ref Range   TSH 2.640 0.450 - 4.500 uIU/mL  VITAMIN D 25 Hydroxy (Vit-D Deficiency, Fractures)  Result Value Ref Range   Vit D, 25-Hydroxy 25.3 (L) 30.0 - 100.0 ng/mL      Assessment & Plan:   Problem List Items Addressed This Visit      Cardiovascular and Mediastinum   Hypertension - Primary    Chronic, stable.  BP at goal for age.  Continue current medication regimen and adjust as needed.  Labs today, BMP.      Relevant Orders   Basic metabolic panel     Other   Hyperlipidemia    No current medications.  No lipid panel today per patient request, this is appropriate.  Patient age 86, risk vs benefit of medication with advanced age discussed and 10 year risk score discussed.      Vitamin D deficiency    Ongoing,  continue supplement and recheck Vit D level today.      Relevant Orders    VITAMIN D 25 Hydroxy (Vit-D Deficiency, Fractures)       Follow up plan: Return in about 6 months (around 08/03/2020) for HTN/HLD, Allergies.

## 2020-02-01 NOTE — Assessment & Plan Note (Signed)
Ongoing, continue supplement and recheck Vit D level today. 

## 2020-02-02 LAB — BASIC METABOLIC PANEL
BUN/Creatinine Ratio: 19 (ref 12–28)
BUN: 20 mg/dL (ref 10–36)
CO2: 22 mmol/L (ref 20–29)
Calcium: 9.2 mg/dL (ref 8.7–10.3)
Chloride: 103 mmol/L (ref 96–106)
Creatinine, Ser: 1.06 mg/dL — ABNORMAL HIGH (ref 0.57–1.00)
GFR calc Af Amer: 53 mL/min/{1.73_m2} — ABNORMAL LOW (ref >59)
GFR calc non Af Amer: 46 mL/min/{1.73_m2} — ABNORMAL LOW (ref >59)
Glucose: 133 mg/dL — ABNORMAL HIGH (ref 65–99)
Potassium: 4.5 mmol/L (ref 3.5–5.2)
Sodium: 140 mmol/L (ref 134–144)

## 2020-02-02 LAB — VITAMIN D 25 HYDROXY (VIT D DEFICIENCY, FRACTURES): Vit D, 25-Hydroxy: 26.3 ng/mL — ABNORMAL LOW (ref 30.0–100.0)

## 2020-02-02 NOTE — Telephone Encounter (Signed)
Pt's daughter C. Sherrie Mustache stated she did not receive email from 3/12 re-sent today 3/23   Follow up on: 02/04/20  [name]  Care Guide . Embedded Care Coordination [facility] Management [name]  [phone]

## 2020-02-02 NOTE — Progress Notes (Signed)
Please let Rhonda Ingram know her labs have returned.  Kidney function is showing some mild kidney disease on these labs, change from previous labs.  I do recommend ensuring she is drinking a good amount of water daily and taking her Benazepril.  Vitamin D level is a little low, recommend she taking Vitamin D3 1000 units daily which she can get in vitamin section at many locations.  If any questions or concerns let me know.  We will recheck kidney function next visit.

## 2020-02-12 ENCOUNTER — Emergency Department
Admission: EM | Admit: 2020-02-12 | Discharge: 2020-02-12 | Disposition: A | Discharge status: Home / Self Care | Payer: Medicare HMO | Attending: Emergency Medicine | Admitting: Emergency Medicine

## 2020-02-12 ENCOUNTER — Ambulatory Visit: Payer: Self-pay | Admitting: *Deleted

## 2020-02-12 ENCOUNTER — Other Ambulatory Visit: Payer: Self-pay

## 2020-02-12 DIAGNOSIS — I10 Essential (primary) hypertension: Secondary | ICD-10-CM | POA: Insufficient documentation

## 2020-02-12 DIAGNOSIS — Z79899 Other long term (current) drug therapy: Secondary | ICD-10-CM | POA: Diagnosis not present

## 2020-02-12 DIAGNOSIS — R531 Weakness: Secondary | ICD-10-CM | POA: Diagnosis present

## 2020-02-12 DIAGNOSIS — K922 Gastrointestinal hemorrhage, unspecified: Secondary | ICD-10-CM | POA: Insufficient documentation

## 2020-02-12 LAB — CBC WITH DIFFERENTIAL/PLATELET
Abs Immature Granulocytes: 0.01 10*3/uL (ref 0.00–0.07)
Basophils Absolute: 0.1 10*3/uL (ref 0.0–0.1)
Basophils Relative: 1 %
Eosinophils Absolute: 0.2 10*3/uL (ref 0.0–0.5)
Eosinophils Relative: 3 %
HCT: 34.6 % — ABNORMAL LOW (ref 36.0–46.0)
Hemoglobin: 11.3 g/dL — ABNORMAL LOW (ref 12.0–15.0)
Immature Granulocytes: 0 %
Lymphocytes Relative: 31 %
Lymphs Abs: 2 10*3/uL (ref 0.7–4.0)
MCH: 28.4 pg (ref 26.0–34.0)
MCHC: 32.7 g/dL (ref 30.0–36.0)
MCV: 86.9 fL (ref 80.0–100.0)
Monocytes Absolute: 0.8 10*3/uL (ref 0.1–1.0)
Monocytes Relative: 12 %
Neutro Abs: 3.4 10*3/uL (ref 1.7–7.7)
Neutrophils Relative %: 53 %
Platelets: 230 10*3/uL (ref 150–400)
RBC: 3.98 MIL/uL (ref 3.87–5.11)
RDW: 13.5 % (ref 11.5–15.5)
WBC: 6.4 10*3/uL (ref 4.0–10.5)
nRBC: 0 % (ref 0.0–0.2)

## 2020-02-12 LAB — CBC
HCT: 37 % (ref 36.0–46.0)
Hemoglobin: 11.9 g/dL — ABNORMAL LOW (ref 12.0–15.0)
MCH: 28.5 pg (ref 26.0–34.0)
MCHC: 32.2 g/dL (ref 30.0–36.0)
MCV: 88.5 fL (ref 80.0–100.0)
Platelets: 238 10*3/uL (ref 150–400)
RBC: 4.18 MIL/uL (ref 3.87–5.11)
RDW: 13.5 % (ref 11.5–15.5)
WBC: 6.7 10*3/uL (ref 4.0–10.5)
nRBC: 0 % (ref 0.0–0.2)

## 2020-02-12 LAB — URINALYSIS, COMPLETE (UACMP) WITH MICROSCOPIC
Bilirubin Urine: NEGATIVE
Glucose, UA: NEGATIVE mg/dL
Ketones, ur: NEGATIVE mg/dL
Nitrite: NEGATIVE
Protein, ur: NEGATIVE mg/dL
Specific Gravity, Urine: 1.005 (ref 1.005–1.030)
pH: 6 (ref 5.0–8.0)

## 2020-02-12 LAB — SAMPLE TO BLOOD BANK

## 2020-02-12 LAB — BASIC METABOLIC PANEL
Anion gap: 7 (ref 5–15)
BUN: 20 mg/dL (ref 8–23)
CO2: 28 mmol/L (ref 22–32)
Calcium: 9.3 mg/dL (ref 8.9–10.3)
Chloride: 105 mmol/L (ref 98–111)
Creatinine, Ser: 0.81 mg/dL (ref 0.44–1.00)
GFR calc Af Amer: >60 mL/min (ref >60)
GFR calc non Af Amer: >60 mL/min (ref >60)
Glucose, Bld: 121 mg/dL — ABNORMAL HIGH (ref 70–99)
Potassium: 3.9 mmol/L (ref 3.5–5.1)
Sodium: 140 mmol/L (ref 135–145)

## 2020-02-12 MED ORDER — PANTOPRAZOLE SODIUM 40 MG PO TBEC
40.0000 mg | DELAYED_RELEASE_TABLET | Freq: Once | ORAL | Status: AC
  Administered 2020-02-12: 40 mg via ORAL
  Filled 2020-02-12: qty 1

## 2020-02-12 MED ORDER — PANTOPRAZOLE SODIUM 40 MG PO TBEC: 40.0000 mg | DELAYED_RELEASE_TABLET | Freq: Every day | ORAL | 1 refills | Status: DC

## 2020-02-12 MED ORDER — SODIUM CHLORIDE 0.9% FLUSH: 3.0000 mL | Freq: Once | INTRAVENOUS | Status: DC

## 2020-02-12 NOTE — Discharge Instructions (Signed)
As we discussed it is very important that you return for any further bleeding, weakness or shortness of breath.

## 2020-02-12 NOTE — ED Provider Notes (Signed)
[facility] Emergency Department Provider Note   ____________________________________________   I have reviewed the triage vital signs and the nursing notes.   HISTORY  Chief Complaint Weakness and Blood In Stools   History limited by: Not Limited   HPI Rhonda Ingram is a 86 y.o. female who presents to the emergency department today because of concerns for GI bleed as well as weakness. The patient states she first noticed some blood mixed in her stool about a week ago. It was dark. Today however she went to have a bowel movement this morning and it was bright red. She also felt weak this morning. The patient states that she had gi bleed a few years ago and had a polyp removed. She denies any blood thinner use.   Records reviewed. Per medical record review patient has a history of office visit for rectal bleeding in 2019.  Past Medical History:  Diagnosis Date  . Arthritis    osteoarthritis  . Hyperlipidemia   . Hypertension   . Osteoporosis   . Vitamin D deficiency     Patient Active Problem List   Diagnosis Date Noted  . Vitamin D deficiency 02/01/2020  . Senile purpura (HCC) 08/03/2019  . Advanced care planning/counseling discussion 03/18/2018  . Ventral hernia 09/03/2016  . Allergic reaction 11/16/2015  . Hypertension 08/02/2015  . Hyperlipidemia 08/02/2015    Past Surgical History:  Procedure Laterality Date  . CHOLECYSTECTOMY    . EYE SURGERY     cataract extraction  . VENTRAL HERNIA REPAIR N/A 10/08/2016   Procedure: HERNIA REPAIR VENTRAL ADULT;  Surgeon: Kieth Brightly, MD;  Location: [facility] ORS;  Service: General;  Laterality: N/A;    Prior to Admission medications   Medication Sig Start Date End Date Taking? Authorizing Provider  benazepril (LOTENSIN) 5 MG tablet TAKE 1 TABLET EVERY DAY 04/06/19   Steele Sizer, MD  diphenhydrAMINE (BENADRYL) 25 MG tablet Take 25 mg by mouth every 6 (six) hours as needed. Taking 1/2  tablet to 1 tablet prn at night for sleep    [provider]  fluticasone (FLONASE) 50 MCG/ACT nasal spray Place 1-2 sprays into both nostrils daily as needed (for allergies.).  08/16/16   [provider]  triamcinolone cream (KENALOG) 0.1 % APPLY TWICE DAILY AS DIRECTED 03/13/19   Steele Sizer, MD    Allergies Meat extract and Other  Family History  Problem Relation Age of Onset  . Hypertension Mother   . Stroke Mother   . Cancer Father        lung  . Hypertension Sister   . Hyperlipidemia Sister   . Hypertension Daughter   . Cerebral palsy Daughter   . Hypertension Maternal Grandmother   . Hypertension Maternal Grandfather   . Hypertension Paternal Grandmother   . Hypertension Paternal Grandfather   . Hypertension Sister   . Hyperlipidemia Sister   . Migraines Daughter   . Diabetes Brother   . Hypertension Brother   . Hyperlipidemia Brother     Social History Social History   Tobacco Use  . Smoking status: Never Smoker  . Smokeless tobacco: Never Used  Substance Use Topics  . Alcohol use: No  . Drug use: No    Review of Systems Constitutional: No fever/chills Eyes: No visual changes. ENT: No sore throat. Cardiovascular: Denies chest pain. Respiratory: Denies shortness of breath. Gastrointestinal: Positive for rectal bleed.  Genitourinary: Negative for dysuria. Musculoskeletal: Negative for back pain. Skin: Negative for rash.  Neurological: Negative for headaches, focal weakness or numbness.  ____________________________________________   PHYSICAL EXAM:  VITAL SIGNS: ED Triage Vitals  Enc Vitals Group     BP 02/12/20 1412 (!) 171/82     Pulse Rate 02/12/20 1412 72     Resp 02/12/20 1412 18     Temp 02/12/20 1412 98.1 F (36.7 C)     Temp Source 02/12/20 1631 Oral     SpO2 02/12/20 1412 96 %     Weight 02/12/20 1414 115 lb (52.2 kg)     Height 02/12/20 1414 5' (1.524 m)     Head Circumference --      Peak Flow --      Pain  Score 02/12/20 1412 0   Constitutional: Alert and oriented.  Eyes: Conjunctivae are normal.  ENT      Head: Normocephalic and atraumatic.      Nose: No congestion/rhinnorhea.      Mouth/Throat: Mucous membranes are moist.      Neck: No stridor. Hematological/Lymphatic/Immunilogical: No cervical lymphadenopathy. Cardiovascular: Normal rate, regular rhythm.  No murmurs, rubs, or gallops.  Respiratory: Normal respiratory effort without tachypnea nor retractions. Breath sounds are clear and equal bilaterally. No wheezes/rales/rhonchi. Gastrointestinal: Soft and non tender. No rebound. No guarding.  Rectal: No blood or melenotic stool. GUIAC positive.  Musculoskeletal: Normal range of motion in all extremities. No lower extremity edema. Neurologic:  Normal speech and language. No gross focal neurologic deficits are appreciated.  Skin:  Skin is warm, dry and intact. No rash noted. Psychiatric: Mood and affect are normal. Speech and behavior are normal. Patient exhibits appropriate insight and judgment.  ____________________________________________    LABS (pertinent positives/negatives)  CBC wbc 6.7, hgb 11.9, plt 238 CBC wbc 6.4, hgb 11.3, plt 230 UA clear, moderate hgb dipstick, trace leukocytes, rare bacteria BMP wnl except glu 121  ____________________________________________   EKG  I, Nance Pear, attending physician, personally viewed and interpreted this EKG  EKG Time: 1424 Rate: 70 Rhythm: normal sinus rhythm Axis: normal Intervals: qtc 436 QRS: narrow ST changes: no st elevation Impression: normal ekg  ____________________________________________    RADIOLOGY  None  ____________________________________________   PROCEDURES  Procedures  ____________________________________________   INITIAL IMPRESSION / ASSESSMENT AND PLAN / ED COURSE  Pertinent labs & imaging results that were available during my care of the patient were reviewed by me and  considered in my medical decision making (see chart for details).   Patient presented to the emergency department today because of concerns for GI bleeding some weakness.  Patient has a history of GI bleeds in the past.  Initial hemoglobin of the very mild anemia.  I did discussion with the patient.  Did offer admission versus continued observation and repeat testing here in the emergency department.  Patient chose the latter.  Repeat testing showed a fairly insignificant drop in the patient's hemoglobin.  She did not have any further bloody bowel movements here in the emergency department.  She did continue to want to go home however I did again offer admission.  We did discuss strict return precautions for any further bleeding, worsening weakness or shortness of breath.  ____________________________________________   FINAL CLINICAL IMPRESSION(S) / ED DIAGNOSES  Final diagnoses:  Gastrointestinal hemorrhage, unspecified gastrointestinal hemorrhage type     Note: This dictation was prepared with Dragon dictation. Any transcriptional errors that result from this process are unintentional     Nance Pear, MD 02/12/20 2108

## 2020-02-12 NOTE — ED Notes (Signed)
This RN ambulated pt to bathroom, no complaints from Pt.  EDP bedside.

## 2020-02-12 NOTE — ED Triage Notes (Signed)
FIRST NURSE NOTE- here for blood in stool. Ambulatory. NAD

## 2020-02-12 NOTE — ED Notes (Addendum)
This RN bedside, pt alert and oriented x4, ambulatory from triage to room. No c/o weakness or pain at this time.  NAD noted, call bell within reach. Granddaughter, Christi bedside.

## 2020-02-12 NOTE — ED Triage Notes (Signed)
Pt comes via POV from home with c/o dark stools and weakness. Pt states the weakness has been going on for about a week.  Pt states dark stools that started about a week ago. Pt states this am she noticed bright red blood in stool.  Pt states hx of blood transfusions.

## 2020-02-12 NOTE — ED Notes (Signed)
Pt assisted to BR and back by this RN.

## 2020-02-12 NOTE — Telephone Encounter (Signed)
Pt stated that she had bleeding like this about 2 or 3 years ago. She had to have a blood transfusion. She had been passing dark stool for the last 2 weeks and today having bright bleeding, messed up her panties and PJ's. She denies abd pain, fever or diarrhea. She felt a little light headed this morning. Per protocol, she should be seen within 24 hours.  No availability at the practice. Pt advised to go to an UC. She voiced understanding and her granddaughter will take her. Routing to [facility] for review.  Reason for Disposition . MODERATE rectal bleeding (small blood clots, passing blood without stool, or toilet water turns red)  Answer Assessment - Initial Assessment Questions 1. APPEARANCE of BLOOD: "What color is it?" "Is it passed separately, on the surface of the stool, or mixed in with the stool?"      Bright blood today. Having dark blood for the pass 2 weeks 2. AMOUNT: "How much blood was passed?"      Pt states a good amount, went thru her panties and pajama bottoms 3. FREQUENCY: "How many times has blood been passed with the stools?"      Everyday 4. ONSET: "When was the blood first seen in the stools?" (Days or weeks)      About 2 weeks ago 5. DIARRHEA: "Is there also some diarrhea?" If so, ask: "How many diarrhea stools were passed in past 24 hours?"      no 6. CONSTIPATION: "Do you have constipation?" If so, "How bad is it?"     no 7. RECURRENT SYMPTOMS: "Have you had blood in your stools before?" If so, ask: "When was the last time?" and "What happened that time?"      Yes about 3 years ago 8. BLOOD THINNERS: "Do you take any blood thinners?" (e.g., Coumadin/warfarin, Pradaxa/dabigatran, aspirin)     no 9. OTHER SYMPTOMS: "Do you have any other symptoms?"  (e.g., abdominal pain, vomiting, dizziness, fever)     Felt  lightheaded this morning. 10. PREGNANCY: "Is there any chance you are pregnant?" "When was your last menstrual period?"       n/a  Protocols used: RECTAL  BLEEDING-A-AH

## 2020-02-12 NOTE — ED Notes (Signed)
EDP and this RN bedside for rectal exam by EDP.

## 2020-02-14 LAB — URINE CULTURE: Culture: <10000 — AB

## 2020-02-16 NOTE — Telephone Encounter (Signed)
   KNB 02/16/2020  Name: Rhonda Ingram   MRN: [id number]   DOB: 1926-05-02   AGE: 86 y.o.   GENDER: female   PCP Particia Nearing, PA-C.   Called pt regarding Community Resource Referral, pt's granddaughter was not available to speak. Rhonda Ingram ask that I c/b after lunch Follow up on: 02/16/2020 after 12pm  [name]  Care Guide . Embedded Care Coordination [facility] Management [name]  [phone]

## 2020-02-17 NOTE — Telephone Encounter (Signed)
   KNB 02/17/2020 2nd Attempt  Name: Rhonda Ingram   MRN: [id number]   DOB: Apr 30, 1926   AGE: 86 y.o.   GENDER: female   PCP Particia Nearing, PA-C.   Called pt regarding Community Resource Referral for [facility] Follow up on: 02/18/20  [name]  Care Guide . Embedded Care Coordination [facility] Management [name]  [phone]

## 2020-02-18 ENCOUNTER — Other Ambulatory Visit: Payer: Self-pay

## 2020-02-18 ENCOUNTER — Emergency Department
Admission: EM | Admit: 2020-02-18 | Discharge: 2020-02-18 | Disposition: A | Discharge status: Home / Self Care | Payer: Medicare HMO | Attending: Emergency Medicine | Admitting: Emergency Medicine

## 2020-02-18 ENCOUNTER — Emergency Department: Payer: Medicare HMO

## 2020-02-18 ENCOUNTER — Encounter: Payer: Self-pay | Admitting: Emergency Medicine

## 2020-02-18 DIAGNOSIS — Z79899 Other long term (current) drug therapy: Secondary | ICD-10-CM | POA: Diagnosis not present

## 2020-02-18 DIAGNOSIS — I10 Essential (primary) hypertension: Secondary | ICD-10-CM | POA: Insufficient documentation

## 2020-02-18 DIAGNOSIS — R109 Unspecified abdominal pain: Secondary | ICD-10-CM | POA: Diagnosis not present

## 2020-02-18 DIAGNOSIS — K625 Hemorrhage of anus and rectum: Secondary | ICD-10-CM | POA: Insufficient documentation

## 2020-02-18 DIAGNOSIS — K649 Unspecified hemorrhoids: Secondary | ICD-10-CM | POA: Insufficient documentation

## 2020-02-18 LAB — COMPREHENSIVE METABOLIC PANEL
ALT: 9 U/L (ref 0–44)
AST: 16 U/L (ref 15–41)
Albumin: 3.1 g/dL — ABNORMAL LOW (ref 3.5–5.0)
Alkaline Phosphatase: 50 U/L (ref 38–126)
Anion gap: 5 (ref 5–15)
BUN: 22 mg/dL (ref 8–23)
CO2: 26 mmol/L (ref 22–32)
Calcium: 8.9 mg/dL (ref 8.9–10.3)
Chloride: 106 mmol/L (ref 98–111)
Creatinine, Ser: 0.83 mg/dL (ref 0.44–1.00)
GFR calc Af Amer: >60 mL/min (ref >60)
GFR calc non Af Amer: >60 mL/min (ref >60)
Glucose, Bld: 127 mg/dL — ABNORMAL HIGH (ref 70–99)
Potassium: 4.5 mmol/L (ref 3.5–5.1)
Sodium: 137 mmol/L (ref 135–145)
Total Bilirubin: 0.4 mg/dL (ref 0.3–1.2)
Total Protein: 7 g/dL (ref 6.5–8.1)

## 2020-02-18 LAB — CBC
HCT: 31.3 % — ABNORMAL LOW (ref 36.0–46.0)
HCT: 31.9 % — ABNORMAL LOW (ref 36.0–46.0)
Hemoglobin: 9.9 g/dL — ABNORMAL LOW (ref 12.0–15.0)
Hemoglobin: 10 g/dL — ABNORMAL LOW (ref 12.0–15.0)
MCH: 28 pg (ref 26.0–34.0)
MCH: 28 pg (ref 26.0–34.0)
MCHC: 31.6 g/dL (ref 30.0–36.0)
MCHC: 31.3 g/dL (ref 30.0–36.0)
MCV: 88.7 fL (ref 80.0–100.0)
MCV: 89.4 fL (ref 80.0–100.0)
Platelets: 250 10*3/uL (ref 150–400)
Platelets: 271 10*3/uL (ref 150–400)
RBC: 3.53 MIL/uL — ABNORMAL LOW (ref 3.87–5.11)
RBC: 3.57 MIL/uL — ABNORMAL LOW (ref 3.87–5.11)
RDW: 13.7 % (ref 11.5–15.5)
RDW: 13.6 % (ref 11.5–15.5)
WBC: 5.9 10*3/uL (ref 4.0–10.5)
WBC: 8.4 10*3/uL (ref 4.0–10.5)
nRBC: 0 % (ref 0.0–0.2)
nRBC: 0 % (ref 0.0–0.2)

## 2020-02-18 LAB — TYPE AND SCREEN
ABO/RH(D): A POS
Antibody Screen: NEGATIVE

## 2020-02-18 MED ORDER — AMOXICILLIN-POT CLAVULANATE 875-125 MG PO TABS: 1.0000 | ORAL_TABLET | Freq: Two times a day (BID) | ORAL | 0 refills | Status: AC

## 2020-02-18 MED ORDER — WITCH HAZEL-GLYCERIN EX PADS: 1.0000 "application " | MEDICATED_PAD | CUTANEOUS | 12 refills | Status: DC | PRN

## 2020-02-18 MED ORDER — IOHEXOL 300 MG/ML  SOLN
75.0000 mL | Freq: Once | INTRAMUSCULAR | Status: AC | PRN
  Administered 2020-02-18: 75 mL via INTRAVENOUS

## 2020-02-18 MED ORDER — HYDROCORTISONE ACETATE 25 MG RE SUPP: 25.0000 mg | Freq: Two times a day (BID) | RECTAL | 0 refills | Status: AC

## 2020-02-18 MED ORDER — SENNOSIDES-DOCUSATE SODIUM 8.6-50 MG PO TABS: 2.0000 | ORAL_TABLET | Freq: Two times a day (BID) | ORAL | 0 refills | Status: DC

## 2020-02-18 NOTE — ED Triage Notes (Signed)
Patient states she has bleeding polyps and this morning had four bowel movements and there was 'a lot of blood'.  AAOx3.  Skin warm and dry. NAD

## 2020-02-18 NOTE — ED Provider Notes (Signed)
[facility] Emergency Department Provider Note  ____________________________________________  Time seen: Approximately 2:40 PM  I have reviewed the triage vital signs and the nursing notes.   HISTORY  Chief Complaint Rectal Bleeding    HPI Rhonda Ingram is a 86 y.o. female with a history of hypertension hyperlipidemia who comes the ED complaining of  rectal bleeding this morning.  She reports 4 separate bowel movements with red blood.  Denies chest pain shortness of breath fevers chills nausea or vomiting.  Reports she is eating and drinking normally.  No lightheadedness or near syncope.  She does feel fatigued.  Denies dysuria.  She reports a history of "bleeding polyps."  She had an appointment with gastroenterology yesterday but reports that she was late to the appointment and had it rescheduled for about 3 weeks from now.  Symptoms are intermittent, currently seem resolved, no aggravating or alleviating factors.  States that she feels ready to go home.     Past Medical History:  Diagnosis Date  . Arthritis    osteoarthritis  . Hyperlipidemia   . Hypertension   . Osteoporosis   . Vitamin D deficiency      Patient Active Problem List   Diagnosis Date Noted  . Vitamin D deficiency 02/01/2020  . Senile purpura (HCC) 08/03/2019  . Advanced care planning/counseling discussion 03/18/2018  . Ventral hernia 09/03/2016  . Allergic reaction 11/16/2015  . Hypertension 08/02/2015  . Hyperlipidemia 08/02/2015     Past Surgical History:  Procedure Laterality Date  . CHOLECYSTECTOMY    . EYE SURGERY     cataract extraction  . VENTRAL HERNIA REPAIR N/A 10/08/2016   Procedure: HERNIA REPAIR VENTRAL ADULT;  Surgeon: Kieth Brightly, MD;  Location: [facility] ORS;  Service: General;  Laterality: N/A;     Prior to Admission medications   Medication Sig Start Date End Date Taking? Authorizing Provider  benazepril (LOTENSIN) 5 MG tablet TAKE 1 TABLET  EVERY DAY 04/06/19   Steele Sizer, MD  diphenhydrAMINE (BENADRYL) 25 MG tablet Take 25 mg by mouth every 6 (six) hours as needed. Taking 1/2 tablet to 1 tablet prn at night for sleep    [provider]  fluticasone (FLONASE) 50 MCG/ACT nasal spray Place 1-2 sprays into both nostrils daily as needed (for allergies.).  08/16/16   [provider]  hydrocortisone (ANUSOL-HC) 25 MG suppository Place 1 suppository (25 mg total) rectally every 12 (twelve) hours for 7 days. 02/18/20 02/25/20  Sharman Cheek, MD  pantoprazole (PROTONIX) 40 MG tablet Take 1 tablet (40 mg total) by mouth daily. 02/12/20 02/11/21  Phineas Semen, MD  senna-docusate (SENOKOT-S) 8.6-50 MG tablet Take 2 tablets by mouth 2 (two) times daily. 02/18/20   Sharman Cheek, MD  triamcinolone cream (KENALOG) 0.1 % APPLY TWICE DAILY AS DIRECTED 03/13/19   Steele Sizer, MD  witch hazel-glycerin (TUCKS) pad Apply 1 application topically as needed for itching. 02/18/20   Sharman Cheek, MD     Allergies Meat extract and Other   Family History  Problem Relation Age of Onset  . Hypertension Mother   . Stroke Mother   . Cancer Father        lung  . Hypertension Sister   . Hyperlipidemia Sister   . Hypertension Daughter   . Cerebral palsy Daughter   . Hypertension Maternal Grandmother   . Hypertension Maternal Grandfather   . Hypertension Paternal Grandmother   . Hypertension Paternal Grandfather   . Hypertension Sister   .  Hyperlipidemia Sister   . Migraines Daughter   . Diabetes Brother   . Hypertension Brother   . Hyperlipidemia Brother     Social History Social History   Tobacco Use  . Smoking status: Never Smoker  . Smokeless tobacco: Never Used  Substance Use Topics  . Alcohol use: No  . Drug use: No    Review of Systems  Constitutional:   No fever or chills.  ENT:   No sore throat. No rhinorrhea. Cardiovascular:   No chest pain or syncope. Respiratory:   No dyspnea or  cough. Gastrointestinal:   Negative for abdominal pain or vomiting.  Positive rectal bleeding Musculoskeletal:   Negative for focal pain or swelling All other systems reviewed and are negative except as documented above in ROS and HPI.  ____________________________________________   PHYSICAL EXAM:  VITAL SIGNS: ED Triage Vitals  Enc Vitals Group     BP 02/18/20 1035 132/68     Pulse Rate 02/18/20 1035 80     Resp 02/18/20 1035 16     Temp 02/18/20 1035 98.6 F (37 C)     Temp Source 02/18/20 1035 Oral     SpO2 02/18/20 1035 96 %     Weight --      Height --      Head Circumference --      Peak Flow --      Pain Score 02/18/20 1421 0     Pain Loc --      Pain Edu? --      Excl. in [place]? --     Vital signs reviewed, nursing assessments reviewed.   Constitutional:   Alert and oriented. Non-toxic appearance. Eyes:   Conjunctivae are normal. EOMI. PERRL. ENT      Head:   Normocephalic and atraumatic.      Nose:   Wearing a mask.      Mouth/Throat:   Wearing a mask.      Neck:   No meningismus. Full ROM. Hematological/Lymphatic/Immunilogical:   No cervical lymphadenopathy. Cardiovascular:   RRR. Symmetric bilateral radial and DP pulses.  No murmurs. Cap refill less than 2 seconds. Respiratory:   Normal respiratory effort without tachypnea/retractions. Breath sounds are clear and equal bilaterally. No wheezes/rales/rhonchi. Gastrointestinal:   Soft with diffuse left-sided abdominal tenderness. Non distended. There is no CVA tenderness.  No rebound, rigidity, or guarding.  rectal exam shows enlarged external hemorrhoids, nonthrombosed.  There is a small amount of bright red blood on digital exam which appears to be from the hemorrhoid. Musculoskeletal:   Normal range of motion in all extremities. No joint effusions.  No lower extremity tenderness.  No edema. Neurologic:   Normal speech and language.  Motor grossly intact. No acute focal neurologic deficits are appreciated.   Skin:    Skin is warm, dry and intact. No rash noted.  No petechiae, purpura, or bullae.  ____________________________________________    LABS (pertinent positives/negatives) (all labs ordered are listed, but only abnormal results are displayed) Labs Reviewed  COMPREHENSIVE METABOLIC PANEL - Abnormal; Notable for the following components:      Result Value   Glucose, Bld 127 (*)    Albumin 3.1 (*)    All other components within normal limits  CBC - Abnormal; Notable for the following components:   RBC 3.53 (*)    Hemoglobin 9.9 (*)    HCT 31.3 (*)    All other components within normal limits  CBC  POC OCCULT BLOOD, ED  TYPE AND  SCREEN   ____________________________________________   EKG    ____________________________________________    RADIOLOGY  No results found.  ____________________________________________   PROCEDURES Procedures  ____________________________________________  DIFFERENTIAL DIAGNOSIS   Bleeding external hemorrhoid, diverticulitis, colitis, UTI.  CLINICAL IMPRESSION / ASSESSMENT AND PLAN / ED COURSE  Medications ordered in the ED: Medications - No data to display  Pertinent labs & imaging results that were available during my care of the patient were reviewed by me and considered in my medical decision making (see chart for details).  Rhonda Ingram was evaluated in Emergency Department on 02/18/2020 for the symptoms described in the history of present illness. She was evaluated in the context of the global COVID-19 pandemic, which necessitated consideration that the patient might be at risk for infection with the SARS-CoV-2 virus that causes COVID-19. Institutional protocols and algorithms that pertain to the evaluation of patients at risk for COVID-19 are in a state of rapid change based on information released by regulatory bodies including the CDC and federal and state organizations. These policies and algorithms were followed during the  patient's care in the ED.   Patient presents with rectal bleeding, also found to have abdominal tenderness on exam.  Concern for diverticulitis/colitis causing the bleeding versus hemorrhoid.  Also question UTI.  Doubt intra-abdominal abscess, perforation, obstruction, mesenteric ischemia.  She is not on blood thinners.  She is hemodynamically stable.  Hemoglobin is 10.  I will trend the hemoglobin with a repeat, obtain CT scan of the abdomen and pelvis.  Suspect she can be discharged home with hemorrhoid treatment if these test results are reassuring.  Okey Regal be signed out to the oncoming physician at 3:00 PM.      ____________________________________________   FINAL CLINICAL IMPRESSION(S) / ED DIAGNOSES    Final diagnoses:  Rectal bleeding  Hemorrhoids, unspecified hemorrhoid type     ED Discharge Orders         Ordered    witch hazel-glycerin (TUCKS) pad  As needed     02/18/20 1440    senna-docusate (SENOKOT-S) 8.6-50 MG tablet  2 times daily     02/18/20 1440    hydrocortisone (ANUSOL-HC) 25 MG suppository  Every 12 hours     02/18/20 1440          Portions of this note were generated with dragon dictation software. Dictation errors may occur despite best attempts at proofreading.   Sharman Cheek, MD 02/18/20 [phone]

## 2020-02-18 NOTE — ED Provider Notes (Signed)
CT as below. In the setting of her abdominal pain and bloody stools will opt to treat with a course of Augmentin.  Patient otherwise stable for discharge.  CT A/P IMPRESSION:  1. Extensive sigmoid and colonic diverticulosis. Minimal perisigmoid haziness, likely chronic. Mild acute diverticulitis is less likely. Clinical correlation is recommended. No bowel obstruction. Normal appendix.  2. Several calcific densities along the course of the right ureter, likely vascular calcification. No hydronephrosis.    Miguel Aschoff., MD 02/18/20 [phone]

## 2020-02-18 NOTE — ED Notes (Signed)
Pt states having blood in her stools that she has previously had with polyps. Pt states she's been feeling increaseingly weak.

## 2020-02-18 NOTE — Discharge Instructions (Addendum)
Thank you for letting us take care of you in the emergency department today.  We will send you home with medications to help with your hemorrhoids, as well as a short course of antibiotics for the possibility of a mild colon (intestine) infection.  Please take these as directed.  Sometimes antibiotics can make you have loose stool, in this case we advise taking an over-the-counter probiotic to help.  Follow-up with your regular doctor to review your ER visit.  Return to the emergency department for any new or worsening symptoms.

## 2020-02-22 NOTE — Telephone Encounter (Signed)
   KNB 02/22/2020 3rd Attempt  Name: Rhonda Ingram   MRN: [id number]   DOB: 13-Mar-1926   AGE: 86 y.o.   GENDER: female   PCP Particia Nearing, PA-C.   Called pt regarding Community Resource Referral  3rd attempt Closing referral pending any other needs of patient.   [name]  Care Guide . Embedded Care Coordination [facility] Management [name]  [phone]

## 2020-02-23 DIAGNOSIS — K625 Hemorrhage of anus and rectum: Secondary | ICD-10-CM | POA: Diagnosis not present

## 2020-02-23 DIAGNOSIS — K649 Unspecified hemorrhoids: Secondary | ICD-10-CM | POA: Diagnosis not present

## 2020-02-24 ENCOUNTER — Ambulatory Visit: Payer: Medicare HMO | Admitting: Nurse Practitioner

## 2020-05-09 ENCOUNTER — Other Ambulatory Visit: Payer: Self-pay | Admitting: Family Medicine

## 2020-05-09 MED ORDER — PANTOPRAZOLE SODIUM 40 MG PO TBEC: 40.0000 mg | DELAYED_RELEASE_TABLET | Freq: Every day | ORAL | 1 refills | Status: DC

## 2020-05-09 NOTE — Telephone Encounter (Signed)
Please advise 

## 2020-05-09 NOTE — Telephone Encounter (Addendum)
Pt request refill  pantoprazole (PROTONIX) 40 MG tablet Pt takes 1 /day. 90 day  Fleet Contras has never filled this for her, but pt wants to know if she will take it over.  [facility] - [place], [place] - [address] Phone:  [phone]  Fax:  [phone]

## 2020-05-09 NOTE — Telephone Encounter (Signed)
Requested medication (s) are due for refill today:   Yes  Requested medication (s) are on the active medication list:   Yes  Future visit scheduled:   Yes with Roosvelt Maser   Last ordered: 02/12/2020 #30 with 1 refill.  Clinic note:  This will be the first time this Rx has been filled at this practice.  Pt is wanting to know if Roosvelt Maser would begin prescribing it for her.   Pt is scheduled with Jolene [name] coming up but has seen Fleet Contras in the past.   Requested Prescriptions  Pending Prescriptions Disp Refills   pantoprazole (PROTONIX) 40 MG tablet 30 tablet 1    Sig: Take 1 tablet (40 mg total) by mouth daily.      Gastroenterology: Proton Pump Inhibitors Passed - 05/09/2020  1:32 PM      Passed - Valid encounter within last 12 months    Recent Outpatient Visits           3 months ago Essential hypertension   [facility] [place], Dorie Rank, NP   9 months ago Encounter for annual physical exam   [facility], Dorie Rank, NP   11 months ago Essential hypertension   [facility] [place], Redge Gainer, MD   1 year ago Essential hypertension   [facility] [place], Redge Gainer, MD   1 year ago Essential hypertension   [facility] [place], Redge Gainer, MD       Future Appointments             In 2 months  [facility], [facility]   In 2 months [place], Salley Hews, PA-C [facility], [facility]

## 2020-05-09 NOTE — Addendum Note (Signed)
Addended by: Lannie Fields on: 05/09/2020 01:32 PM   Modules accepted: Orders

## 2020-05-13 ENCOUNTER — Ambulatory Visit (INDEPENDENT_AMBULATORY_CARE_PROVIDER_SITE_OTHER): Payer: Medicare HMO | Admitting: Family Medicine

## 2020-05-13 ENCOUNTER — Other Ambulatory Visit: Payer: Self-pay

## 2020-05-13 ENCOUNTER — Encounter: Payer: Self-pay | Admitting: Family Medicine

## 2020-05-13 VITALS — BP 139/77 | HR 70 | Temp 98.3°F | Wt 111.0 lb

## 2020-05-13 DIAGNOSIS — R252 Cramp and spasm: Secondary | ICD-10-CM

## 2020-05-13 DIAGNOSIS — E559 Vitamin D deficiency, unspecified: Secondary | ICD-10-CM | POA: Diagnosis not present

## 2020-05-13 DIAGNOSIS — M25511 Pain in right shoulder: Secondary | ICD-10-CM | POA: Diagnosis not present

## 2020-05-13 DIAGNOSIS — R5383 Other fatigue: Secondary | ICD-10-CM | POA: Diagnosis not present

## 2020-05-13 NOTE — Patient Instructions (Signed)
You can get diclofenac gel (voltaren is brand name) at the drugstore and apply to shoulder up to 4 times daily for pain and inflammation

## 2020-05-13 NOTE — Progress Notes (Signed)
BP 139/77   Pulse 70   Temp 98.3 F (36.8 C) (Oral)   Wt 111 lb (50.3 kg)   LMP  (LMP Unknown)   SpO2 97%   BMI 21.68 kg/m    Subjective:    Patient ID: Rhonda Ingram, female    DOB: May 14, 1926, 86 y.o.   MRN: [id number]  HPI: Rhonda Ingram is a 86 y.o. female  Chief Complaint  Patient presents with  . Shoulder Pain    pt states has fallen on her right shoulder last Saturday   . Fatigue   Patient presenting today to discuss worsening fatigue issues the past few months. Having to rest after tasks that she didn't used to have to sit down after. Still able to perform ADLs independently, and still enjoys working in her garden but can't work on tasks very long anymore. Denies CP, SOB/DOE, weight loss, fevers, chills, night sweats.   Larey Seat against a door about 6 days ago and hurt the right shoulder and bruised face. Denies trauma to head, LOC, or other areas of pain from incident. Notes the fall was mechanical. Taking OTC tylenol prn for these issues. Has had a bad left shoulder for years now so it's been very difficult to have the right shoulder stiff and painful as well and still get her daily tasks completed. Denies bruising, deformity, numbness or tingling down arm.     Relevant past medical, surgical, family and social history reviewed and updated as indicated. Interim medical history since our last visit reviewed. Allergies and medications reviewed and updated.  Review of Systems  Per HPI unless specifically indicated above     Objective:    BP 139/77   Pulse 70   Temp 98.3 F (36.8 C) (Oral)   Wt 111 lb (50.3 kg)   LMP  (LMP Unknown)   SpO2 97%   BMI 21.68 kg/m   Wt Readings from Last 3 Encounters:  05/13/20 111 lb (50.3 kg)  02/12/20 115 lb (52.2 kg)  08/03/19 113 lb (51.3 kg)    Physical Exam Vitals and nursing note reviewed.  Constitutional:      Appearance: Normal appearance. She is not ill-appearing.  HENT:     Head: Atraumatic.     Nose: Nose  normal.     Mouth/Throat:     Mouth: Mucous membranes are moist.     Pharynx: Oropharynx is clear.  Eyes:     Extraocular Movements: Extraocular movements intact.     Conjunctiva/sclera: Conjunctivae normal.     Pupils: Pupils are equal, round, and reactive to light.  Cardiovascular:     Rate and Rhythm: Normal rate and regular rhythm.     Heart sounds: Normal heart sounds.  Pulmonary:     Effort: Pulmonary effort is normal.     Breath sounds: Normal breath sounds.  Abdominal:     General: Bowel sounds are normal.     Palpations: Abdomen is soft.     Tenderness: There is no abdominal tenderness. There is no guarding.  Musculoskeletal:        General: Tenderness (ttp posterior right shoulder laterally) present. No swelling. Normal range of motion.     Cervical back: Normal range of motion and neck supple.     Comments: ROM right shoulder mostly intact, some pain beyond 90 degree extension  Skin:    General: Skin is warm and dry.     Findings: Bruising (bruising around left eye, no ttp or deformities to orbit) present.  Neurological:     General: No focal deficit present.     Mental Status: She is alert and oriented to person, place, and time. Mental status is at baseline.     Motor: No weakness.     Gait: Gait normal.  Psychiatric:        Mood and Affect: Mood normal.        Thought Content: Thought content normal.        Judgment: Judgment normal.     Results for orders placed or performed in visit on 05/13/20  Microscopic Examination   Urine  Result Value Ref Range   WBC, UA 11-30 (A) 0 - 5 /hpf   RBC 3-10 (A) 0 - 2 /hpf   Epithelial Cells (non renal) 0-10 0 - 10 /hpf   Bacteria, UA Moderate (A) None seen/Few  Urine Culture, Reflex   Urine  Result Value Ref Range   Urine Culture, Routine Preliminary report (A)    Organism ID, Bacteria Gram negative rods (A)   Comprehensive metabolic panel  Result Value Ref Range   Glucose 112 (H) 65 - 99 mg/dL   BUN 23 10 - 36  mg/dL   Creatinine, Ser 4.62 0.57 - 1.00 mg/dL   GFR calc non Af Amer 54 (L) >59 mL/min/1.73   GFR calc Af Amer 63 >59 mL/min/1.73   BUN/Creatinine Ratio 25 12 - 28   Sodium 138 134 - 144 mmol/L   Potassium 4.4 3.5 - 5.2 mmol/L   Chloride 102 96 - 106 mmol/L   CO2 22 20 - 29 mmol/L   Calcium 9.3 8.7 - 10.3 mg/dL   Total Protein 7.8 6.0 - 8.5 g/dL   Albumin 3.8 3.5 - 4.6 g/dL   Globulin, Total 4.0 1.5 - 4.5 g/dL   Albumin/Globulin Ratio 1.0 (L) 1.2 - 2.2   Bilirubin Total <0.2 0.0 - 1.2 mg/dL   Alkaline Phosphatase 73 48 - 121 IU/L   AST 19 0 - 40 IU/L   ALT 8 0 - 32 IU/L  VITAMIN D 25 Hydroxy (Vit-D Deficiency, Fractures)  Result Value Ref Range   Vit D, 25-Hydroxy 29.6 (L) 30.0 - 100.0 ng/mL  TSH  Result Value Ref Range   TSH 3.830 0.450 - 4.500 uIU/mL  CBC with Differential/Platelet  Result Value Ref Range   WBC 6.6 3.4 - 10.8 x10E3/uL   RBC 4.69 3.77 - 5.28 x10E6/uL   Hemoglobin 12.0 11.1 - 15.9 g/dL   Hematocrit 70.3 50.0 - 46.6 %   MCV 83 79 - 97 fL   MCH 25.6 (L) 26.6 - 33.0 pg   MCHC 30.9 (L) 31 - 35 g/dL   RDW 93.8 18.2 - 99.3 %   Platelets 261 150 - 450 x10E3/uL   Neutrophils 62 Not Estab. %   Lymphs 25 Not Estab. %   Monocytes 10 Not Estab. %   Eos 2 Not Estab. %   Basos 1 Not Estab. %   Neutrophils Absolute 4.1 1 - 7 x10E3/uL   Lymphocytes Absolute 1.6 0 - 3 x10E3/uL   Monocytes Absolute 0.7 0 - 0 x10E3/uL   EOS (ABSOLUTE) 0.1 0.0 - 0.4 x10E3/uL   Basophils Absolute 0.1 0 - 0 x10E3/uL   Immature Granulocytes 0 Not Estab. %   Immature Grans (Abs) 0.0 0.0 - 0.1 x10E3/uL  UA/M w/rflx Culture, Routine   Specimen: Urine   Urine  Result Value Ref Range   Specific Gravity, UA 1.010 1.005 - 1.030   pH, UA 6.0 5.0 -  7.5   Color, UA Yellow Yellow   Appearance Ur Clear Clear   Leukocytes,UA 3+ (A) Negative   Protein,UA Negative Negative/Trace   Glucose, UA Negative Negative   Ketones, UA Negative Negative   RBC, UA 1+ (A) Negative   Bilirubin, UA Negative  Negative   Urobilinogen, Ur 0.2 0.2 - 1.0 mg/dL   Nitrite, UA Negative Negative   Microscopic Examination See below:    Urinalysis Reflex Comment       Assessment & Plan:   Problem List Items Addressed This Visit      Other   Vitamin D deficiency    On OTC supplementation, recheck levels given fatigue sxs and increase dose if needed      Relevant Orders   VITAMIN D 25 Hydroxy (Vit-D Deficiency, Fractures) (Completed)    Other Visit Diagnoses    Fatigue, unspecified type    -  Primary   Check basic labs, add B complex vitamins. Continue to monitor closely and remain active as tolerated   Relevant Orders   Comprehensive metabolic panel (Completed)   VITAMIN D 25 Hydroxy (Vit-D Deficiency, Fractures) (Completed)   TSH (Completed)   CBC with Differential/Platelet (Completed)   UA/M w/rflx Culture, Routine (Completed)   Leg cramp       Intermittent. Increase fluids, leg cramp blend vitamins prn, check electrolytes today   Relevant Orders   Comprehensive metabolic panel (Completed)   Acute pain of right shoulder       From mechanical fall. Trial diclofenac gel, heat/ice prn. ROM exercises reviewed, strengthen slowly as tolerated. WIll obtain x-ray if not resolving        Follow up plan: Return if symptoms worsen or fail to improve.

## 2020-05-14 LAB — COMPREHENSIVE METABOLIC PANEL
ALT: 8 IU/L (ref 0–32)
AST: 19 IU/L (ref 0–40)
Albumin/Globulin Ratio: 1 — ABNORMAL LOW (ref 1.2–2.2)
Albumin: 3.8 g/dL (ref 3.5–4.6)
Alkaline Phosphatase: 73 IU/L (ref 48–121)
BUN/Creatinine Ratio: 25 (ref 12–28)
BUN: 23 mg/dL (ref 10–36)
Bilirubin Total: <0.2 mg/dL (ref 0.0–1.2)
CO2: 22 mmol/L (ref 20–29)
Calcium: 9.3 mg/dL (ref 8.7–10.3)
Chloride: 102 mmol/L (ref 96–106)
Creatinine, Ser: 0.92 mg/dL (ref 0.57–1.00)
GFR calc Af Amer: 63 mL/min/{1.73_m2} (ref >59)
GFR calc non Af Amer: 54 mL/min/{1.73_m2} — ABNORMAL LOW (ref >59)
Globulin, Total: 4 g/dL (ref 1.5–4.5)
Glucose: 112 mg/dL — ABNORMAL HIGH (ref 65–99)
Potassium: 4.4 mmol/L (ref 3.5–5.2)
Sodium: 138 mmol/L (ref 134–144)
Total Protein: 7.8 g/dL (ref 6.0–8.5)

## 2020-05-14 LAB — CBC WITH DIFFERENTIAL/PLATELET
Basophils Absolute: 0.1 10*3/uL (ref 0.0–0.2)
Basos: 1 %
EOS (ABSOLUTE): 0.1 10*3/uL (ref 0.0–0.4)
Eos: 2 %
Hematocrit: 38.8 % (ref 34.0–46.6)
Hemoglobin: 12 g/dL (ref 11.1–15.9)
Immature Grans (Abs): 0 10*3/uL (ref 0.0–0.1)
Immature Granulocytes: 0 %
Lymphocytes Absolute: 1.6 10*3/uL (ref 0.7–3.1)
Lymphs: 25 %
MCH: 25.6 pg — ABNORMAL LOW (ref 26.6–33.0)
MCHC: 30.9 g/dL — ABNORMAL LOW (ref 31.5–35.7)
MCV: 83 fL (ref 79–97)
Monocytes Absolute: 0.7 10*3/uL (ref 0.1–0.9)
Monocytes: 10 %
Neutrophils Absolute: 4.1 10*3/uL (ref 1.4–7.0)
Neutrophils: 62 %
Platelets: 261 10*3/uL (ref 150–450)
RBC: 4.69 x10E6/uL (ref 3.77–5.28)
RDW: 13.8 % (ref 11.7–15.4)
WBC: 6.6 10*3/uL (ref 3.4–10.8)

## 2020-05-14 LAB — TSH: TSH: 3.83 u[IU]/mL (ref 0.450–4.500)

## 2020-05-14 LAB — VITAMIN D 25 HYDROXY (VIT D DEFICIENCY, FRACTURES): Vit D, 25-Hydroxy: 29.6 ng/mL — ABNORMAL LOW (ref 30.0–100.0)

## 2020-05-18 ENCOUNTER — Other Ambulatory Visit: Payer: Self-pay | Admitting: Family Medicine

## 2020-05-18 MED ORDER — SULFAMETHOXAZOLE-TRIMETHOPRIM 800-160 MG PO TABS: 1.0000 | ORAL_TABLET | Freq: Two times a day (BID) | ORAL | 0 refills | Status: DC

## 2020-05-18 NOTE — Assessment & Plan Note (Signed)
On OTC supplementation, recheck levels given fatigue sxs and increase dose if needed

## 2020-05-19 LAB — UA/M W/RFLX CULTURE, ROUTINE
Bilirubin, UA: NEGATIVE
Glucose, UA: NEGATIVE
Ketones, UA: NEGATIVE
Nitrite, UA: NEGATIVE
Protein,UA: NEGATIVE
Specific Gravity, UA: 1.01 (ref 1.005–1.030)
Urobilinogen, Ur: 0.2 mg/dL (ref 0.2–1.0)
pH, UA: 6 (ref 5.0–7.5)

## 2020-05-19 LAB — MICROSCOPIC EXAMINATION

## 2020-05-19 LAB — URINE CULTURE, REFLEX

## 2020-05-20 ENCOUNTER — Other Ambulatory Visit: Payer: Self-pay

## 2020-05-20 ENCOUNTER — Ambulatory Visit
Admission: RE | Admit: 2020-05-20 | Discharge: 2020-05-20 | Disposition: A | Discharge status: Home / Self Care | Payer: Medicare HMO | Attending: Family Medicine | Admitting: Family Medicine

## 2020-05-20 ENCOUNTER — Ambulatory Visit
Admission: RE | Admit: 2020-05-20 | Discharge: 2020-05-20 | Disposition: A | Discharge status: Home / Self Care | Payer: Medicare HMO | Source: Ambulatory Visit | Attending: Family Medicine | Admitting: Family Medicine

## 2020-05-20 ENCOUNTER — Other Ambulatory Visit: Payer: Self-pay | Admitting: Family Medicine

## 2020-05-20 ENCOUNTER — Telehealth: Payer: Self-pay | Admitting: Family Medicine

## 2020-05-20 DIAGNOSIS — M25511 Pain in right shoulder: Secondary | ICD-10-CM | POA: Diagnosis not present

## 2020-05-20 NOTE — Telephone Encounter (Signed)
Order placed, [facility] notified.

## 2020-05-20 NOTE — Telephone Encounter (Signed)
Rhonda Ingram calling from [facility] calling for orders for the patient. The patient reports that she is not feeling any better. Selena Batten is awaiting order for the patient. Donalynn [phone]

## 2020-05-24 ENCOUNTER — Telehealth: Payer: Self-pay | Admitting: Family Medicine

## 2020-05-24 NOTE — Telephone Encounter (Signed)
Medication Refill - Medication: triamcinalone 0.1 cream  Has the patient contacted their pharmacy? Yes.   (Agent: If no, request that the patient contact the pharmacy for the refill.) (Agent: If yes, when and what did the pharmacy advise?)  Preferred Pharmacy (with phone number or street name): [facility] - [place], [place] -   Agent: Please be advised that RX refills may take up to 3 business days. We ask that you follow-up with your pharmacy.

## 2020-05-26 ENCOUNTER — Other Ambulatory Visit: Payer: Self-pay | Admitting: Family Medicine

## 2020-05-26 DIAGNOSIS — G8929 Other chronic pain: Secondary | ICD-10-CM

## 2020-05-26 DIAGNOSIS — M25511 Pain in right shoulder: Secondary | ICD-10-CM

## 2020-05-26 NOTE — Progress Notes (Signed)
efer

## 2020-05-30 ENCOUNTER — Telehealth: Payer: Self-pay | Admitting: Family Medicine

## 2020-05-30 MED ORDER — PANTOPRAZOLE SODIUM 40 MG PO TBEC: 40.0000 mg | DELAYED_RELEASE_TABLET | Freq: Every day | ORAL | 1 refills | Status: DC

## 2020-05-30 NOTE — Telephone Encounter (Signed)
Medication Refill - Medication:pantoprozole  Has the patient contacted their pharmacy? Yes.   (Agent: If no, request that the patient contact the pharmacy for the refill.) (Agent: If yes, when and what did the pharmacy advise?)  Preferred Pharmacy (with phone number or street name)[facility] - [place], [place] -  Agent: Please be advised that RX refills may take up to 3 business days. We ask that you follow-up with your pharmacy.

## 2020-05-30 NOTE — Telephone Encounter (Signed)
Rx sent 

## 2020-05-31 ENCOUNTER — Other Ambulatory Visit: Payer: Self-pay

## 2020-05-31 DIAGNOSIS — I10 Essential (primary) hypertension: Secondary | ICD-10-CM

## 2020-05-31 MED ORDER — BENAZEPRIL HCL 5 MG PO TABS: 5.0000 mg | ORAL_TABLET | Freq: Every day | ORAL | 0 refills | Status: DC

## 2020-06-02 DIAGNOSIS — M19019 Primary osteoarthritis, unspecified shoulder: Secondary | ICD-10-CM | POA: Diagnosis not present

## 2020-06-02 DIAGNOSIS — M25511 Pain in right shoulder: Secondary | ICD-10-CM | POA: Diagnosis not present

## 2020-06-13 ENCOUNTER — Telehealth: Payer: Self-pay | Admitting: Family Medicine

## 2020-06-13 NOTE — Telephone Encounter (Signed)
Copied from CRM [id number]. Topic: General - Other >> Jun 13, 2020  2:53 PM Rhonda Ingram E wrote: Reason for CRM: Pt called to speak with Rhonda Ingram about the call to assist with her home / Pt called to make sure this wasn't a scam/ please advise

## 2020-06-15 NOTE — Telephone Encounter (Signed)
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

## 2020-06-16 DIAGNOSIS — R293 Abnormal posture: Secondary | ICD-10-CM | POA: Diagnosis not present

## 2020-06-16 DIAGNOSIS — M6281 Muscle weakness (generalized): Secondary | ICD-10-CM | POA: Diagnosis not present

## 2020-06-16 DIAGNOSIS — M25511 Pain in right shoulder: Secondary | ICD-10-CM | POA: Diagnosis not present

## 2020-06-30 DIAGNOSIS — M25511 Pain in right shoulder: Secondary | ICD-10-CM | POA: Diagnosis not present

## 2020-06-30 DIAGNOSIS — M25611 Stiffness of right shoulder, not elsewhere classified: Secondary | ICD-10-CM | POA: Diagnosis not present

## 2020-07-01 DIAGNOSIS — M19011 Primary osteoarthritis, right shoulder: Secondary | ICD-10-CM | POA: Diagnosis not present

## 2020-07-07 DIAGNOSIS — M25611 Stiffness of right shoulder, not elsewhere classified: Secondary | ICD-10-CM | POA: Diagnosis not present

## 2020-07-07 DIAGNOSIS — M25511 Pain in right shoulder: Secondary | ICD-10-CM | POA: Diagnosis not present

## 2020-07-11 DIAGNOSIS — M25511 Pain in right shoulder: Secondary | ICD-10-CM | POA: Diagnosis not present

## 2020-07-11 DIAGNOSIS — M25611 Stiffness of right shoulder, not elsewhere classified: Secondary | ICD-10-CM | POA: Diagnosis not present

## 2020-07-14 DIAGNOSIS — M25511 Pain in right shoulder: Secondary | ICD-10-CM | POA: Diagnosis not present

## 2020-07-14 DIAGNOSIS — M25611 Stiffness of right shoulder, not elsewhere classified: Secondary | ICD-10-CM | POA: Diagnosis not present

## 2020-07-15 ENCOUNTER — Telehealth: Payer: Self-pay | Admitting: Family Medicine

## 2020-07-15 NOTE — Telephone Encounter (Signed)
Copied from CRM [id number]. Topic: Medicare AWV >> Jul 15, 2020 10:42 AM Claudette Laws R wrote: Reason for CRM:  Tried contacting the patient to rescheduled her AWVS scheduled 08/03/2020.  It need to be rescheduled due to [facility] not in office. Please schedule to be done by phone and schedule for 45 minute appointment after 08/03/2020.

## 2020-07-19 DIAGNOSIS — M25611 Stiffness of right shoulder, not elsewhere classified: Secondary | ICD-10-CM | POA: Diagnosis not present

## 2020-07-19 DIAGNOSIS — M25511 Pain in right shoulder: Secondary | ICD-10-CM | POA: Diagnosis not present

## 2020-07-20 ENCOUNTER — Telehealth: Payer: Self-pay | Admitting: Family Medicine

## 2020-07-20 NOTE — Telephone Encounter (Signed)
Copied from CRM [id number]. Topic: Medicare AWV >> Jul 20, 2020  9:24 AM Claudette Laws R wrote: Reason for CRM:  Tried contacting patient to reschedule her AWVS on 08/03/2020 due to [facility] not in office this day.  Please reschedule to another day to be done by phone.

## 2020-07-21 DIAGNOSIS — M25611 Stiffness of right shoulder, not elsewhere classified: Secondary | ICD-10-CM | POA: Diagnosis not present

## 2020-07-21 DIAGNOSIS — M25511 Pain in right shoulder: Secondary | ICD-10-CM | POA: Diagnosis not present

## 2020-07-26 DIAGNOSIS — M25611 Stiffness of right shoulder, not elsewhere classified: Secondary | ICD-10-CM | POA: Diagnosis not present

## 2020-07-26 DIAGNOSIS — M25511 Pain in right shoulder: Secondary | ICD-10-CM | POA: Diagnosis not present

## 2020-07-28 DIAGNOSIS — M6281 Muscle weakness (generalized): Secondary | ICD-10-CM | POA: Diagnosis not present

## 2020-07-28 DIAGNOSIS — M25511 Pain in right shoulder: Secondary | ICD-10-CM | POA: Diagnosis not present

## 2020-07-28 DIAGNOSIS — M25611 Stiffness of right shoulder, not elsewhere classified: Secondary | ICD-10-CM | POA: Diagnosis not present

## 2020-07-28 DIAGNOSIS — R293 Abnormal posture: Secondary | ICD-10-CM | POA: Diagnosis not present

## 2020-08-03 ENCOUNTER — Other Ambulatory Visit: Payer: Self-pay

## 2020-08-03 ENCOUNTER — Encounter: Payer: Self-pay | Admitting: Family Medicine

## 2020-08-03 ENCOUNTER — Ambulatory Visit: Payer: Medicare HMO | Admitting: Family Medicine

## 2020-08-03 ENCOUNTER — Ambulatory Visit (INDEPENDENT_AMBULATORY_CARE_PROVIDER_SITE_OTHER): Payer: Medicare HMO | Admitting: Family Medicine

## 2020-08-03 ENCOUNTER — Ambulatory Visit: Payer: Medicare HMO

## 2020-08-03 VITALS — BP 138/84 | HR 72 | Temp 98.4°F | Wt 113.0 lb

## 2020-08-03 DIAGNOSIS — E559 Vitamin D deficiency, unspecified: Secondary | ICD-10-CM

## 2020-08-03 DIAGNOSIS — I10 Essential (primary) hypertension: Secondary | ICD-10-CM

## 2020-08-03 DIAGNOSIS — D692 Other nonthrombocytopenic purpura: Secondary | ICD-10-CM

## 2020-08-03 DIAGNOSIS — E782 Mixed hyperlipidemia: Secondary | ICD-10-CM | POA: Diagnosis not present

## 2020-08-03 DIAGNOSIS — Z23 Encounter for immunization: Secondary | ICD-10-CM

## 2020-08-03 MED ORDER — MELOXICAM 7.5 MG PO TABS: 7.5000 mg | ORAL_TABLET | Freq: Every day | ORAL | 3 refills | Status: DC

## 2020-08-03 MED ORDER — PANTOPRAZOLE SODIUM 40 MG PO TBEC: 40.0000 mg | DELAYED_RELEASE_TABLET | Freq: Every day | ORAL | 1 refills | Status: DC

## 2020-08-03 MED ORDER — BENAZEPRIL HCL 5 MG PO TABS: 5.0000 mg | ORAL_TABLET | Freq: Every day | ORAL | 1 refills | Status: DC

## 2020-08-03 NOTE — Assessment & Plan Note (Signed)
Rechecking labs today. Continue to monitor. Call with any concerns.  °

## 2020-08-03 NOTE — Progress Notes (Signed)
BP 138/84 (BP Location: Left Arm, Cuff Size: Normal)    Pulse 72    Temp 98.4 F (36.9 C) (Oral)    Wt 113 lb (51.3 kg)    LMP  (LMP Unknown)    SpO2 98%    BMI 22.07 kg/m    Subjective:    Patient ID: Rhonda Ingram, female    DOB: 17-Aug-1926, 86 y.o.   MRN: [id number]  HPI: Rhonda Ingram is a 86 y.o. female  Chief Complaint  Patient presents with   Hypertension   Hyperlipidemia   HYPERTENSION / HYPERLIPIDEMIA Satisfied with current treatment? yes Duration of hypertension: chronic BP monitoring frequency: not checking BP medication side effects: no Past BP meds: benazepril Duration of hyperlipidemia: chronic Cholesterol medication side effects: not on anything Cholesterol supplements: none Past cholesterol medications: none Medication compliance: excellent compliance Aspirin: no Recent stressors: no Recurrent headaches: no Visual changes: no Palpitations: no Dyspnea: no Chest pain: no Lower extremity edema: no Dizzy/lightheaded: no  Relevant past medical, surgical, family and social history reviewed and updated as indicated. Interim medical history since our last visit reviewed. Allergies and medications reviewed and updated.  Review of Systems  Constitutional: Negative.   Respiratory: Negative.   Cardiovascular: Negative.   Gastrointestinal: Negative.   Musculoskeletal: Positive for arthralgias and myalgias. Negative for back pain, gait problem, joint swelling, neck pain and neck stiffness.  Neurological: Negative.   Psychiatric/Behavioral: Negative.     Per HPI unless specifically indicated above     Objective:    BP 138/84 (BP Location: Left Arm, Cuff Size: Normal)    Pulse 72    Temp 98.4 F (36.9 C) (Oral)    Wt 113 lb (51.3 kg)    LMP  (LMP Unknown)    SpO2 98%    BMI 22.07 kg/m   Wt Readings from Last 3 Encounters:  08/03/20 113 lb (51.3 kg)  05/13/20 111 lb (50.3 kg)  02/12/20 115 lb (52.2 kg)    Physical Exam Vitals and nursing note  reviewed.  Constitutional:      General: She is not in acute distress.    Appearance: Normal appearance. She is not ill-appearing, toxic-appearing or diaphoretic.  HENT:     Head: Normocephalic and atraumatic.     Right Ear: External ear normal.     Left Ear: External ear normal.     Nose: Nose normal.     Mouth/Throat:     Mouth: Mucous membranes are moist.     Pharynx: Oropharynx is clear.  Eyes:     General: No scleral icterus.       Right eye: No discharge.        Left eye: No discharge.     Extraocular Movements: Extraocular movements intact.     Conjunctiva/sclera: Conjunctivae normal.     Pupils: Pupils are equal, round, and reactive to light.  Cardiovascular:     Rate and Rhythm: Normal rate and regular rhythm.     Pulses: Normal pulses.     Heart sounds: Normal heart sounds. No murmur heard.  No friction rub. No gallop.   Pulmonary:     Effort: Pulmonary effort is normal. No respiratory distress.     Breath sounds: Normal breath sounds. No stridor. No wheezing, rhonchi or rales.  Chest:     Chest wall: No tenderness.  Musculoskeletal:        General: Normal range of motion.     Cervical back: Normal range of motion and neck  supple.  Skin:    General: Skin is warm and dry.     Capillary Refill: Capillary refill takes less than 2 seconds.     Coloration: Skin is not jaundiced or pale.     Findings: No bruising, erythema, lesion or rash.  Neurological:     General: No focal deficit present.     Mental Status: She is alert and oriented to person, place, and time. Mental status is at baseline.  Psychiatric:        Mood and Affect: Mood normal.        Behavior: Behavior normal.        Thought Content: Thought content normal.        Judgment: Judgment normal.     Results for orders placed or performed in visit on 05/13/20  Microscopic Examination   Urine  Result Value Ref Range   WBC, UA 11-30 (A) 0 - 5 /hpf   RBC 3-10 (A) 0 - 2 /hpf   Epithelial Cells (non  renal) 0-10 0 - 10 /hpf   Bacteria, UA Moderate (A) None seen/Few  Urine Culture, Reflex   Urine  Result Value Ref Range   Urine Culture, Routine Final report (A)    Organism ID, Bacteria Comment (A)    Antimicrobial Susceptibility Comment   Comprehensive metabolic panel  Result Value Ref Range   Glucose 112 (H) 65 - 99 mg/dL   BUN 23 10 - 36 mg/dL   Creatinine, Ser 4.09 0.57 - 1.00 mg/dL   GFR calc non Af Amer 54 (L) >59 mL/min/1.73   GFR calc Af Amer 63 >59 mL/min/1.73   BUN/Creatinine Ratio 25 12 - 28   Sodium 138 134 - 144 mmol/L   Potassium 4.4 3.5 - 5.2 mmol/L   Chloride 102 96 - 106 mmol/L   CO2 22 20 - 29 mmol/L   Calcium 9.3 8.7 - 10.3 mg/dL   Total Protein 7.8 6.0 - 8.5 g/dL   Albumin 3.8 3.5 - 4.6 g/dL   Globulin, Total 4.0 1.5 - 4.5 g/dL   Albumin/Globulin Ratio 1.0 (L) 1.2 - 2.2   Bilirubin Total <0.2 0.0 - 1.2 mg/dL   Alkaline Phosphatase 73 48 - 121 IU/L   AST 19 0 - 40 IU/L   ALT 8 0 - 32 IU/L  VITAMIN D 25 Hydroxy (Vit-D Deficiency, Fractures)  Result Value Ref Range   Vit D, 25-Hydroxy 29.6 (L) 30.0 - 100.0 ng/mL  TSH  Result Value Ref Range   TSH 3.830 0.450 - 4.500 uIU/mL  CBC with Differential/Platelet  Result Value Ref Range   WBC 6.6 3.4 - 10.8 x10E3/uL   RBC 4.69 3.77 - 5.28 x10E6/uL   Hemoglobin 12.0 11.1 - 15.9 g/dL   Hematocrit 81.1 91.4 - 46.6 %   MCV 83 79 - 97 fL   MCH 25.6 (L) 26.6 - 33.0 pg   MCHC 30.9 (L) 31 - 35 g/dL   RDW 78.2 95.6 - 21.3 %   Platelets 261 150 - 450 x10E3/uL   Neutrophils 62 Not Estab. %   Lymphs 25 Not Estab. %   Monocytes 10 Not Estab. %   Eos 2 Not Estab. %   Basos 1 Not Estab. %   Neutrophils Absolute 4.1 1 - 7 x10E3/uL   Lymphocytes Absolute 1.6 0 - 3 x10E3/uL   Monocytes Absolute 0.7 0 - 0 x10E3/uL   EOS (ABSOLUTE) 0.1 0.0 - 0.4 x10E3/uL   Basophils Absolute 0.1 0 - 0 x10E3/uL  Immature Granulocytes 0 Not Estab. %   Immature Grans (Abs) 0.0 0.0 - 0.1 x10E3/uL  UA/M w/rflx Culture, Routine   Specimen:  Urine   Urine  Result Value Ref Range   Specific Gravity, UA 1.010 1.005 - 1.030   pH, UA 6.0 5.0 - 7.5   Color, UA Yellow Yellow   Appearance Ur Clear Clear   Leukocytes,UA 3+ (A) Negative   Protein,UA Negative Negative/Trace   Glucose, UA Negative Negative   Ketones, UA Negative Negative   RBC, UA 1+ (A) Negative   Bilirubin, UA Negative Negative   Urobilinogen, Ur 0.2 0.2 - 1.0 mg/dL   Nitrite, UA Negative Negative   Microscopic Examination See below:    Urinalysis Reflex Comment       Assessment & Plan:   Problem List Items Addressed This Visit      Cardiovascular and Mediastinum   Hypertension - Primary    Under good control on current regimen. Continue current regimen. Continue to monitor. Call with any concerns. Refills given. Labs drawn today.       Relevant Medications   benazepril (LOTENSIN) 5 MG tablet   Other Relevant Orders   Comprehensive metabolic panel   Senile purpura (HCC)    Reassured patient. Call with any concerns.       Relevant Medications   benazepril (LOTENSIN) 5 MG tablet     Other   Hyperlipidemia    Rechecking labs today. Continue to monitor. Call with any concerns.       Relevant Medications   benazepril (LOTENSIN) 5 MG tablet   Other Relevant Orders   Comprehensive metabolic panel   Lipid Panel w/o Chol/HDL Ratio   Vitamin D deficiency    Rechecking labs today. Continue to monitor. Call with any concerns.       Relevant Orders   Comprehensive metabolic panel   VITAMIN D 25 Hydroxy (Vit-D Deficiency, Fractures)    Other Visit Diagnoses    Flu vaccine need       Flu shot given today.   Relevant Orders   Flu Vaccine QUAD High Dose(Fluad) (Completed)       Follow up plan: Return in about 3 months (around 11/02/2020).

## 2020-08-03 NOTE — Assessment & Plan Note (Signed)
Under good control on current regimen. Continue current regimen. Continue to monitor. Call with any concerns. Refills given. Labs drawn today.   

## 2020-08-03 NOTE — Assessment & Plan Note (Signed)
Reassured patient. Call with any concerns.  

## 2020-08-04 LAB — COMPREHENSIVE METABOLIC PANEL
ALT: 10 IU/L (ref 0–32)
AST: 18 IU/L (ref 0–40)
Albumin/Globulin Ratio: 1.1 — ABNORMAL LOW (ref 1.2–2.2)
Albumin: 3.7 g/dL (ref 3.5–4.6)
Alkaline Phosphatase: 70 IU/L (ref 44–121)
BUN/Creatinine Ratio: 21 (ref 12–28)
BUN: 20 mg/dL (ref 10–36)
Bilirubin Total: 0.2 mg/dL (ref 0.0–1.2)
CO2: 25 mmol/L (ref 20–29)
Calcium: 9.4 mg/dL (ref 8.7–10.3)
Chloride: 103 mmol/L (ref 96–106)
Creatinine, Ser: 0.95 mg/dL (ref 0.57–1.00)
GFR calc Af Amer: 60 mL/min/{1.73_m2} (ref >59)
GFR calc non Af Amer: 52 mL/min/{1.73_m2} — ABNORMAL LOW (ref >59)
Globulin, Total: 3.4 g/dL (ref 1.5–4.5)
Glucose: 115 mg/dL — ABNORMAL HIGH (ref 65–99)
Potassium: 4.3 mmol/L (ref 3.5–5.2)
Sodium: 139 mmol/L (ref 134–144)
Total Protein: 7.1 g/dL (ref 6.0–8.5)

## 2020-08-04 LAB — LIPID PANEL W/O CHOL/HDL RATIO
Cholesterol, Total: 236 mg/dL — ABNORMAL HIGH (ref 100–199)
HDL: 64 mg/dL (ref >39)
LDL Chol Calc (NIH): 138 mg/dL — ABNORMAL HIGH (ref 0–99)
Triglycerides: 191 mg/dL — ABNORMAL HIGH (ref 0–149)
VLDL Cholesterol Cal: 34 mg/dL (ref 5–40)

## 2020-08-04 LAB — VITAMIN D 25 HYDROXY (VIT D DEFICIENCY, FRACTURES): Vit D, 25-Hydroxy: 34 ng/mL (ref 30.0–100.0)

## 2020-08-07 ENCOUNTER — Encounter: Payer: Self-pay | Admitting: Family Medicine

## 2020-08-30 DIAGNOSIS — M19011 Primary osteoarthritis, right shoulder: Secondary | ICD-10-CM | POA: Diagnosis not present

## 2020-09-01 DIAGNOSIS — H903 Sensorineural hearing loss, bilateral: Secondary | ICD-10-CM | POA: Diagnosis not present

## 2020-09-21 ENCOUNTER — Ambulatory Visit: Payer: Self-pay

## 2020-09-21 ENCOUNTER — Encounter: Payer: Self-pay | Admitting: Nurse Practitioner

## 2020-09-21 ENCOUNTER — Ambulatory Visit (INDEPENDENT_AMBULATORY_CARE_PROVIDER_SITE_OTHER): Payer: Medicare HMO | Admitting: Nurse Practitioner

## 2020-09-21 ENCOUNTER — Other Ambulatory Visit: Payer: Self-pay

## 2020-09-21 ENCOUNTER — Observation Stay
Admission: EM | Admit: 2020-09-21 | Discharge: 2020-09-24 | Disposition: A | Discharge status: Home / Self Care | Payer: Medicare HMO | Attending: Internal Medicine | Admitting: Internal Medicine

## 2020-09-21 ENCOUNTER — Encounter: Payer: Self-pay | Admitting: Emergency Medicine

## 2020-09-21 ENCOUNTER — Emergency Department: Payer: Medicare HMO

## 2020-09-21 VITALS — BP 133/74 | HR 88 | Temp 98.2°F | Wt 113.2 lb

## 2020-09-21 DIAGNOSIS — D62 Acute posthemorrhagic anemia: Secondary | ICD-10-CM

## 2020-09-21 DIAGNOSIS — Z79899 Other long term (current) drug therapy: Secondary | ICD-10-CM | POA: Insufficient documentation

## 2020-09-21 DIAGNOSIS — I10 Essential (primary) hypertension: Secondary | ICD-10-CM

## 2020-09-21 DIAGNOSIS — M6281 Muscle weakness (generalized): Secondary | ICD-10-CM | POA: Insufficient documentation

## 2020-09-21 DIAGNOSIS — K625 Hemorrhage of anus and rectum: Secondary | ICD-10-CM | POA: Insufficient documentation

## 2020-09-21 DIAGNOSIS — Z20822 Contact with and (suspected) exposure to covid-19: Secondary | ICD-10-CM | POA: Insufficient documentation

## 2020-09-21 DIAGNOSIS — Z9049 Acquired absence of other specified parts of digestive tract: Secondary | ICD-10-CM | POA: Diagnosis not present

## 2020-09-21 DIAGNOSIS — Z791 Long term (current) use of non-steroidal anti-inflammatories (NSAID): Secondary | ICD-10-CM

## 2020-09-21 DIAGNOSIS — K922 Gastrointestinal hemorrhage, unspecified: Secondary | ICD-10-CM | POA: Diagnosis not present

## 2020-09-21 DIAGNOSIS — E785 Hyperlipidemia, unspecified: Secondary | ICD-10-CM | POA: Diagnosis present

## 2020-09-21 DIAGNOSIS — I700 Atherosclerosis of aorta: Secondary | ICD-10-CM | POA: Diagnosis not present

## 2020-09-21 DIAGNOSIS — K921 Melena: Secondary | ICD-10-CM

## 2020-09-21 DIAGNOSIS — K5730 Diverticulosis of large intestine without perforation or abscess without bleeding: Secondary | ICD-10-CM | POA: Diagnosis not present

## 2020-09-21 DIAGNOSIS — K5790 Diverticulosis of intestine, part unspecified, without perforation or abscess without bleeding: Secondary | ICD-10-CM

## 2020-09-21 LAB — COMPREHENSIVE METABOLIC PANEL
ALT: 10 U/L (ref 0–44)
AST: 19 U/L (ref 15–41)
Albumin: 3.2 g/dL — ABNORMAL LOW (ref 3.5–5.0)
Alkaline Phosphatase: 55 U/L (ref 38–126)
Anion gap: 8 (ref 5–15)
BUN: 40 mg/dL — ABNORMAL HIGH (ref 8–23)
CO2: 25 mmol/L (ref 22–32)
Calcium: 8.6 mg/dL — ABNORMAL LOW (ref 8.9–10.3)
Chloride: 103 mmol/L (ref 98–111)
Creatinine, Ser: 0.81 mg/dL (ref 0.44–1.00)
GFR, Estimated: >60 mL/min (ref >60)
Glucose, Bld: 152 mg/dL — ABNORMAL HIGH (ref 70–99)
Potassium: 4.2 mmol/L (ref 3.5–5.1)
Sodium: 136 mmol/L (ref 135–145)
Total Bilirubin: 0.3 mg/dL (ref 0.3–1.2)
Total Protein: 6.9 g/dL (ref 6.5–8.1)

## 2020-09-21 LAB — CBC WITH DIFFERENTIAL/PLATELET
Hematocrit: 25.3 % — ABNORMAL LOW (ref 34.0–46.6)
Hemoglobin: 7.9 g/dL — ABNORMAL LOW (ref 11.1–15.9)
Lymphocytes Absolute: 1.3 10*3/uL (ref 0.7–3.1)
Lymphs: 17 %
MCH: 26.9 pg (ref 26.6–33.0)
MCHC: 31.2 g/dL — ABNORMAL LOW (ref 31.5–35.7)
MCV: 86 fL (ref 79–97)
MID (Absolute): 0.7 10*3/uL (ref 0.1–1.6)
MID: 9 %
Neutrophils Absolute: 5.9 10*3/uL (ref 1.4–7.0)
Neutrophils: 74 %
Platelets: 251 10*3/uL (ref 150–450)
RBC: 2.94 x10E6/uL — ABNORMAL LOW (ref 3.77–5.28)
RDW: 14 % (ref 11.7–15.4)
WBC: 7.9 10*3/uL (ref 3.4–10.8)

## 2020-09-21 LAB — CBC
HCT: 25.3 % — ABNORMAL LOW (ref 36.0–46.0)
Hemoglobin: 8 g/dL — ABNORMAL LOW (ref 12.0–15.0)
MCH: 27.5 pg (ref 26.0–34.0)
MCHC: 31.6 g/dL (ref 30.0–36.0)
MCV: 86.9 fL (ref 80.0–100.0)
Platelets: 239 10*3/uL (ref 150–400)
RBC: 2.91 MIL/uL — ABNORMAL LOW (ref 3.87–5.11)
RDW: 13.4 % (ref 11.5–15.5)
WBC: 8.3 10*3/uL (ref 4.0–10.5)
nRBC: 0 % (ref 0.0–0.2)

## 2020-09-21 LAB — PREPARE RBC (CROSSMATCH)

## 2020-09-21 MED ORDER — METOPROLOL TARTRATE 5 MG/5ML IV SOLN: 5.0000 mg | Freq: Four times a day (QID) | INTRAVENOUS | Status: DC | PRN

## 2020-09-21 MED ORDER — IOHEXOL 300 MG/ML  SOLN
75.0000 mL | Freq: Once | INTRAMUSCULAR | Status: AC | PRN
  Administered 2020-09-21: 75 mL via INTRAVENOUS

## 2020-09-21 MED ORDER — METOPROLOL TARTRATE 5 MG/5ML IV SOLN
2.5000 mg | Freq: Four times a day (QID) | INTRAVENOUS | Status: DC | PRN
  Administered 2020-09-22 (×2): 2.5 mg via INTRAVENOUS
  Filled 2020-09-21 (×2): qty 5

## 2020-09-21 MED ORDER — SODIUM CHLORIDE 0.9 % IV SOLN
10.0000 mL/h | Freq: Once | INTRAVENOUS | Status: AC
  Administered 2020-09-22: 10 mL/h via INTRAVENOUS

## 2020-09-21 MED ORDER — SODIUM CHLORIDE 0.9 % IV SOLN
8.0000 mg/h | INTRAVENOUS | Status: DC
  Administered 2020-09-22 – 2020-09-23 (×4): 8 mg/h via INTRAVENOUS
  Filled 2020-09-21 (×5): qty 80

## 2020-09-21 MED ORDER — SODIUM CHLORIDE 0.9 % IV SOLN
80.0000 mg | Freq: Once | INTRAVENOUS | Status: AC
  Administered 2020-09-21: 80 mg via INTRAVENOUS
  Filled 2020-09-21: qty 80

## 2020-09-21 MED ORDER — MORPHINE SULFATE (PF) 2 MG/ML IV SOLN: 2.0000 mg | INTRAVENOUS | Status: DC | PRN

## 2020-09-21 MED ORDER — PROMETHAZINE HCL 25 MG PO TABS
12.5000 mg | ORAL_TABLET | Freq: Four times a day (QID) | ORAL | Status: DC | PRN
  Filled 2020-09-21: qty 1

## 2020-09-21 NOTE — ED Notes (Signed)
Back from CT

## 2020-09-21 NOTE — ED Triage Notes (Signed)
Pt comes into the ED via POV c/o rectal bleeding x 2 days.  Pt states she has a h/o of this but this time is worse than normal.  Pt states the blood is dark in color.  She does also admit to hemorrhoids.  Pt denies any SHOB or dizziness, but last Hgb was noted to be a little over 7.  Pt in NAd at this time with even and unlabored respirations.  Pt denies any blood thinner use.

## 2020-09-21 NOTE — ED Notes (Signed)
Pt family at bedside

## 2020-09-21 NOTE — H&P (Addendum)
History and Physical    Rhonda Ingram'S [facility] LKG:[id number] DOB: 1926-08-24 DOA: 09/21/2020  PCP: Marjie Skiff, NP   Patient coming from: Home  I have personally briefly reviewed patient's old medical records in [facility]  Chief Complaint: Blood in stool, black stool  HPI: Rhonda Ingram is a 86 y.o. female with medical history significant for hypertension, arthritis on meloxicam, remote history of hemorrhoidal bleed who presents to the emergency room on referral by her PCP where she presented following 2 days of intermittent rectal bleed consisting of black stool as well as bright red blood.  Patient states she was having left lower quadrant pain and constipation of the bleeding started after she was straining on the toilet. the left lower quadrant was a moderate intensity radiated to her back, with no aggravating or alleviating symptoms.  Had no nausea or vomiting.  Denied chest pain and palpitations.  Did have weakness and decreased appetite.  She saw her PCP on the day of arrival and had an hemoglobin check which was 7.9, down from her baseline of 12.   ED Course: On arrival, blood pressure 129/75 with pulse of 87 and otherwise normal vitals.  Hemoglobin on arrival was 7.9 remaining stable at 8 when checked 2 hours later.  Other labs were unremarkable. EKG as reviewed by me : Sinus rhythm at 84 with no acute ST-T wave changes CT abdomen and pelvis with contrast showed no acute abdominopelvic abnormality but did show severe sigmoid diverticulosis without CT evidence for diverticulitis Patient typed and crossed started on a unit of blood and hospitalist consulted for admission.  Review of Systems: As per HPI otherwise all other systems on review of systems negative.    Past Medical History:  Diagnosis Date  . Arthritis    osteoarthritis  . Hyperlipidemia   . Hypertension   . Osteoporosis   . Vitamin D deficiency     Past Surgical History:  Procedure Laterality Date  .  CHOLECYSTECTOMY    . EYE SURGERY     cataract extraction  . VENTRAL HERNIA REPAIR N/A 10/08/2016   Procedure: HERNIA REPAIR VENTRAL ADULT;  Surgeon: Kieth Brightly, MD;  Location: [facility] ORS;  Service: General;  Laterality: N/A;     reports that she has never smoked. She has never used smokeless tobacco. She reports that she does not drink alcohol and does not use drugs.  Allergies  Allergen Reactions  . Alpha-D-Galactosidase Itching and Shortness Of Breath  . Meat Extract Shortness Of Breath  . Other     Alpha-gal allergy (meat allergy or Mammalian Meat Allergy) beef/red meat/pork    Family History  Problem Relation Age of Onset  . Hypertension Mother   . Stroke Mother   . Cancer Father        lung  . Hypertension Sister   . Hyperlipidemia Sister   . Hypertension Daughter   . Cerebral palsy Daughter   . Hypertension Maternal Grandmother   . Hypertension Maternal Grandfather   . Hypertension Paternal Grandmother   . Hypertension Paternal Grandfather   . Hypertension Sister   . Hyperlipidemia Sister   . Migraines Daughter   . Diabetes Brother   . Hypertension Brother   . Hyperlipidemia Brother       Prior to Admission medications   Medication Sig Start Date End Date Taking? Authorizing Provider  benazepril (LOTENSIN) 5 MG tablet Take 1 tablet (5 mg total) by mouth daily. 08/03/20   Dorcas Carrow, DO  cyclobenzaprine (FLEXERIL) 5 MG tablet Take 5 mg by mouth as needed. 06/02/20   [provider]  diphenhydrAMINE (BENADRYL) 25 MG tablet Take 25 mg by mouth every 6 (six) hours as needed. Taking 1/2 tablet to 1 tablet prn at night for sleep Patient not taking: Reported on 09/21/2020    [provider]  fluticasone (FLONASE) 50 MCG/ACT nasal spray Place 1-2 sprays into both nostrils daily as needed (for allergies.).  08/16/16   [provider]  meloxicam (MOBIC) 7.5 MG tablet Take 1 tablet (7.5 mg total) by mouth daily. 08/03/20   Johnson, Megan  P, DO  pantoprazole (PROTONIX) 40 MG tablet Take 1 tablet (40 mg total) by mouth daily. 08/03/20 08/03/21  Olevia PerchesJohnson, Megan P, DO  triamcinolone cream (KENALOG) 0.1 % APPLY TWICE DAILY AS DIRECTED 03/13/19   Steele Sizerrissman, Mark A, MD  VITAMIN D PO Take by mouth daily.    [provider]    Physical Exam: Vitals:   09/21/20 2230 09/21/20 2245 09/21/20 2300 09/21/20 2330  BP: (!) 167/62 (!) 173/67 (!) 178/75   Pulse: 86 93 89   Resp:   19   Temp:    99 F (37.2 C)  TempSrc:    Oral  SpO2: 99% 100% 100%   Weight:      Height:         Vitals:   09/21/20 2230 09/21/20 2245 09/21/20 2300 09/21/20 2330  BP: (!) 167/62 (!) 173/67 (!) 178/75   Pulse: 86 93 89   Resp:   19   Temp:    99 F (37.2 C)  TempSrc:    Oral  SpO2: 99% 100% 100%   Weight:      Height:          Constitutional: Alert and oriented x 3 . Not in any apparent distress HEENT:      Head: Normocephalic and atraumatic.         Eyes: PERLA, EOMI, Conjunctivae are normal. Sclera is non-icteric.       Mouth/Throat: Mucous membranes are moist.       Neck: Supple with no signs of meningismus. Cardiovascular: Regular rate and rhythm. No murmurs, gallops, or rubs. 2+ symmetrical distal pulses are present . No JVD. No LE edema Respiratory: Respiratory effort normal .Lungs sounds clear bilaterally. No wheezes, crackles, or rhonchi.  Gastrointestinal: Soft, mild tenderness on palpation in left lower quadrant, and non distended with positive bowel sounds. No rebound or guarding. Genitourinary: No CVA tenderness. Musculoskeletal: Nontender with normal range of motion in all extremities. No cyanosis, or erythema of extremities. Neurologic:  Face is symmetric. Moving all extremities. No gross focal neurologic deficits . Skin: Skin is warm, dry.  No rash or ulcers Psychiatric: Mood and affect are normal    Labs on Admission: I have personally reviewed following labs and imaging studies  CBC: Recent Labs  Lab 09/21/20 1602  09/21/20 1802  WBC 7.9 8.3  NEUTROABS 5.9  --   HGB 7.9* 8.0*  HCT 25.3* 25.3*  MCV 86 86.9  PLT 251 239   Basic Metabolic Panel: Recent Labs  Lab 09/21/20 1802  NA 136  K 4.2  CL 103  CO2 25  GLUCOSE 152*  BUN 40*  CREATININE 0.81  CALCIUM 8.6*   GFR: Estimated Creatinine Clearance: 31.8 mL/min (by C-G formula based on SCr of 0.81 mg/dL). Liver Function Tests: Recent Labs  Lab 09/21/20 1802  AST 19  ALT 10  ALKPHOS 55  BILITOT 0.3  PROT 6.9  ALBUMIN 3.2*   No results for input(s): LIPASE, AMYLASE in the last 168 hours. No results for input(s): AMMONIA in the last 168 hours. Coagulation Profile: No results for input(s): INR, PROTIME in the last 168 hours. Cardiac Enzymes: No results for input(s): CKTOTAL, CKMB, CKMBINDEX, TROPONINI in the last 168 hours. BNP (last 3 results) No results for input(s): PROBNP in the last 8760 hours. HbA1C: No results for input(s): HGBA1C in the last 72 hours. CBG: No results for input(s): GLUCAP in the last 168 hours. Lipid Profile: No results for input(s): CHOL, HDL, LDLCALC, TRIG, CHOLHDL, LDLDIRECT in the last 72 hours. Thyroid Function Tests: No results for input(s): TSH, T4TOTAL, FREET4, T3FREE, THYROIDAB in the last 72 hours. Anemia Panel: No results for input(s): VITAMINB12, FOLATE, FERRITIN, TIBC, IRON, RETICCTPCT in the last 72 hours. Urine analysis:    Component Value Date/Time   COLORURINE STRAW (A) 02/12/2020 1810   APPEARANCEUR Clear 05/13/2020 1212   LABSPEC 1.005 02/12/2020 1810   PHURINE 6.0 02/12/2020 1810   GLUCOSEU Negative 05/13/2020 1212   HGBUR MODERATE (A) 02/12/2020 1810   BILIRUBINUR Negative 05/13/2020 1212   KETONESUR NEGATIVE 02/12/2020 1810   PROTEINUR Negative 05/13/2020 1212   PROTEINUR NEGATIVE 02/12/2020 1810   NITRITE Negative 05/13/2020 1212   NITRITE NEGATIVE 02/12/2020 1810   LEUKOCYTESUR 3+ (A) 05/13/2020 1212   LEUKOCYTESUR TRACE (A) 02/12/2020 1810    Radiological Exams on  Admission: CT ABDOMEN PELVIS W CONTRAST  Result Date: 09/21/2020 CLINICAL DATA:  Diverticulitis suspected. Epigastric pain. Rectal bleeding x2 days. EXAM: CT ABDOMEN AND PELVIS WITH CONTRAST TECHNIQUE: Multidetector CT imaging of the abdomen and pelvis was performed using the standard protocol following bolus administration of intravenous contrast. CONTRAST:  54mL OMNIPAQUE IOHEXOL 300 MG/ML  SOLN COMPARISON:  02/18/2020 FINDINGS: Lower chest: The lung bases are clear. The heart size is normal. Hepatobiliary: The liver is normal. Status post cholecystectomy.There is mild intrahepatic and extrahepatic biliary ductal dilatation which is stable from prior study. Pancreas: Normal contours without ductal dilatation. No peripancreatic fluid collection. Spleen: Unremarkable. Adrenals/Urinary Tract: --Adrenal glands: Unremarkable. --Right kidney/ureter: No hydronephrosis or radiopaque kidney stones. --Left kidney/ureter: No hydronephrosis or radiopaque kidney stones. --Urinary bladder: Unremarkable. Stomach/Bowel: --Stomach/Duodenum: No hiatal hernia or other gastric abnormality. Normal duodenal course and caliber. --Small bowel: Unremarkable. --Colon: There is severe sigmoid diverticulosis. --Appendix: Normal. Vascular/Lymphatic: Atherosclerotic calcification is present within the non-aneurysmal abdominal aorta, without hemodynamically significant stenosis. --No retroperitoneal lymphadenopathy. --No mesenteric lymphadenopathy. --No pelvic or inguinal lymphadenopathy. Reproductive: Unremarkable Other: No ascites or free air. The abdominal wall is normal. Musculoskeletal. No acute displaced fractures. IMPRESSION: 1. No acute abdominopelvic abnormality. 2. Severe sigmoid diverticulosis without CT evidence for diverticulitis. Aortic Atherosclerosis (ICD10-I70.0). Electronically Signed   By: Katherine Mantle M.D.   On: 09/21/2020 23:26     Assessment/Plan 86 year old female with history of hypertension, arthritis on  meloxicam, remote history of hemorrhoidal bleed who presents to the emergency room on referral by her PCP where she presented following 3 days of intermittent rectal bleed consisting of black stool as well as bright red blood.  It is associated with mild epigastric pain, weakness and decreased appetite.    Symptomatic acute blood loss anemia of GI origin   Melena, Hematochezia   Diverticulosis on CT   NSAID long-term use -Patient with history of hemorrhoidal bleeding and on meloxicam presenting with both melena and bright red blood per rectum -She had a hemoglobin check at her PCP and it was 7.9, down from her  baseline of 12.  Has remained stable in the ER -CT abdomen and pelvis showing severe sigmoid diverticulosis -Suspect upper GI bleed but lower GI is a possibility -Continue IV Protonix started in the emergency room -Patient received 1 unit PRBC in the ER. -Continue to monitor H&H posttransfusion -GI consulted for possible endoscopy in the a.m. -IV hydration  Hypertension -Metoprolol 2.5 mg every 6 as needed systolic over 160 -Hold home oral antihypertensive for now  Arthritis -Non-NSAID pain control    DVT prophylaxis: SCDs Code Status: full code  Family Communication:  none  Disposition Plan: Back to previous home environment Consults called: GI Status: Observation    Andris Baumann MD [facility]     09/21/2020, 11:55 PM

## 2020-09-21 NOTE — Patient Instructions (Signed)

## 2020-09-21 NOTE — ED Provider Notes (Signed)
[facility] Emergency Department Provider Note    First MD Initiated Contact with Patient 09/21/20 2159     (approximate)  I have reviewed the triage vital signs and the nursing notes.   HISTORY  Chief Complaint Rectal Bleeding    HPI Rhonda Ingram is a 86 y.o. female below listed history not on anticoagulation presents to the ER for evaluation of 24 hours of dark melanotic stool with bloody diarrhea as well.  Also having epigastric pain radiating through to her back.  She followed up with her outpatient doctor today and they noted that the patient was having GI bleeding checked blood work which showed a hemoglobin of 7.9 and she was sent to the ER for further evaluation.  States that the bleeding has subsided.  She is not had any additional melena or hematochezia in the past several hours but has had decreased p.o. intake and still having some discomfort.  Denies any history of liver disease.  Does have a history of hemorrhoids    Past Medical History:  Diagnosis Date  . Arthritis    osteoarthritis  . Hyperlipidemia   . Hypertension   . Osteoporosis   . Vitamin D deficiency    Family History  Problem Relation Age of Onset  . Hypertension Mother   . Stroke Mother   . Cancer Father        lung  . Hypertension Sister   . Hyperlipidemia Sister   . Hypertension Daughter   . Cerebral palsy Daughter   . Hypertension Maternal Grandmother   . Hypertension Maternal Grandfather   . Hypertension Paternal Grandmother   . Hypertension Paternal Grandfather   . Hypertension Sister   . Hyperlipidemia Sister   . Migraines Daughter   . Diabetes Brother   . Hypertension Brother   . Hyperlipidemia Brother    Past Surgical History:  Procedure Laterality Date  . CHOLECYSTECTOMY    . EYE SURGERY     cataract extraction  . VENTRAL HERNIA REPAIR N/A 10/08/2016   Procedure: HERNIA REPAIR VENTRAL ADULT;  Surgeon: Kieth BrightlySeeplaputhur G Sankar, MD;  Location: [facility]  ORS;  Service: General;  Laterality: N/A;   Patient Active Problem List   Diagnosis Date Noted  . Rectal bleeding 09/21/2020  . Vitamin D deficiency 02/01/2020  . Senile purpura (HCC) 08/03/2019  . Advanced care planning/counseling discussion 03/18/2018  . Ventral hernia 09/03/2016  . Allergic reaction 11/16/2015  . Hypertension 08/02/2015  . Hyperlipidemia 08/02/2015      Prior to Admission medications   Medication Sig Start Date End Date Taking? Authorizing Provider  benazepril (LOTENSIN) 5 MG tablet Take 1 tablet (5 mg total) by mouth daily. 08/03/20   Johnson, Megan P, DO  cyclobenzaprine (FLEXERIL) 5 MG tablet Take 5 mg by mouth as needed. 06/02/20   [provider]  diphenhydrAMINE (BENADRYL) 25 MG tablet Take 25 mg by mouth every 6 (six) hours as needed. Taking 1/2 tablet to 1 tablet prn at night for sleep Patient not taking: Reported on 09/21/2020    [provider]  fluticasone (FLONASE) 50 MCG/ACT nasal spray Place 1-2 sprays into both nostrils daily as needed (for allergies.).  08/16/16   [provider]  meloxicam (MOBIC) 7.5 MG tablet Take 1 tablet (7.5 mg total) by mouth daily. 08/03/20   Johnson, Megan P, DO  pantoprazole (PROTONIX) 40 MG tablet Take 1 tablet (40 mg total) by mouth daily. 08/03/20 08/03/21  Olevia PerchesJohnson, Megan P, DO  triamcinolone cream (KENALOG) 0.1 %  APPLY TWICE DAILY AS DIRECTED 03/13/19   Steele Sizer, MD  VITAMIN D PO Take by mouth daily.    [provider]    Allergies Alpha-d-galactosidase, Meat extract, and Other    Social History Social History   Tobacco Use  . Smoking status: Never Smoker  . Smokeless tobacco: Never Used  Vaping Use  . Vaping Use: Never used  Substance Use Topics  . Alcohol use: No  . Drug use: No    Review of Systems Patient denies headaches, rhinorrhea, blurry vision, numbness, shortness of breath, chest pain, edema, cough, abdominal pain, nausea, vomiting, diarrhea, dysuria,  fevers, rashes or hallucinations unless otherwise stated above in HPI. ____________________________________________   PHYSICAL EXAM:  VITAL SIGNS: Vitals:   09/21/20 2245 09/21/20 2300  BP: (!) 173/67 (!) 178/75  Pulse: 93 89  Resp:  19  Temp:    SpO2: 100% 100%    Constitutional: Alert and oriented.  Eyes: Conjunctivae are normal.  Head: Atraumatic. Nose: No congestion/rhinnorhea. Mouth/Throat: Mucous membranes are moist.   Neck: No stridor. Painless ROM.  Cardiovascular: Normal rate, regular rhythm. Grossly normal heart sounds.  Good peripheral circulation. Respiratory: Normal respiratory effort.  No retractions. Lungs CTAB. Gastrointestinal: Soft and nontender. No distention. No abdominal bruits. No CVA tenderness. Genitourinary: deferred Musculoskeletal: No lower extremity tenderness nor edema.  No joint effusions. Neurologic:  Normal speech and language. No gross focal neurologic deficits are appreciated. No facial droop Skin:  Skin is warm, dry and intact. No rash noted. Psychiatric: Mood and affect are normal. Speech and behavior are normal.  ____________________________________________   LABS (all labs ordered are listed, but only abnormal results are displayed)  Results for orders placed or performed during the hospital encounter of 09/21/20 (from the past 24 hour(s))  Comprehensive metabolic panel     Status: Abnormal   Collection Time: 09/21/20  6:02 PM  Result Value Ref Range   Sodium 136 135 - 145 mmol/L   Potassium 4.2 3.5 - 5.1 mmol/L   Chloride 103 98 - 111 mmol/L   CO2 25 22 - 32 mmol/L   Glucose, Bld 152 (H) 70 - 99 mg/dL   BUN 40 (H) 8 - 23 mg/dL   Creatinine, Ser 5.10 0.44 - 1.00 mg/dL   Calcium 8.6 (L) 8.9 - 10.3 mg/dL   Total Protein 6.9 6.5 - 8.1 g/dL   Albumin 3.2 (L) 3.5 - 5.0 g/dL   AST 19 15 - 41 U/L   ALT 10 0 - 44 U/L   Alkaline Phosphatase 55 38 - 126 U/L   Total Bilirubin 0.3 0.3 - 1.2 mg/dL   GFR, Estimated >25 >85 mL/min   Anion  gap 8 5 - 15  CBC     Status: Abnormal   Collection Time: 09/21/20  6:02 PM  Result Value Ref Range   WBC 8.3 4.0 - 10.5 K/uL   RBC 2.91 (L) 3.87 - 5.11 MIL/uL   Hemoglobin 8.0 (L) 12.0 - 15.0 g/dL   HCT 27.7 (L) 36 - 46 %   MCV 86.9 80.0 - 100.0 fL   MCH 27.5 26.0 - 34.0 pg   MCHC 31.6 30.0 - 36.0 g/dL   RDW 82.4 23.5 - 36.1 %   Platelets 239 150 - 400 K/uL   nRBC 0.0 0.0 - 0.2 %  Type and screen [facility]     Status: None (Preliminary result)   Collection Time: 09/21/20  6:02 PM  Result Value Ref Range  ABO/RH(D) A POS    Antibody Screen NEG    Sample Expiration 09/24/2020,2359    Unit Number [id number]    Blood Component Type RED CELLS,LR    Unit division 00    Status of Unit ISSUED    Transfusion Status OK TO TRANSFUSE    Crossmatch Result      Compatible Performed at [facility], [address], [place], [place] [address]   Prepare RBC (crossmatch)     Status: None   Collection Time: 09/21/20 10:41 PM  Result Value Ref Range   Order Confirmation      ORDER PROCESSED BY BLOOD BANK Performed at [facility], [address], [place], [place] [address]    ____________________________________________  EKG My review and personal interpretation at Time: 22:51   Indication: gi bleed  Rate: 85  Rhythm: sinus Axis: normal Other: normal intervals, no stemi ____________________________________________  RADIOLOGY  I personally reviewed all radiographic images ordered to evaluate for the above acute complaints and reviewed radiology reports and findings.  These findings were personally discussed with the patient.  Please see medical record for radiology report.  ____________________________________________   PROCEDURES  Procedure(s) performed:  .Critical Care Performed by: Willy Eddy, MD Authorized by: Willy Eddy, MD   Critical care provider statement:    Critical care time (minutes):  35   Critical care  time was exclusive of:  Separately billable procedures and treating other patients   Critical care was necessary to treat or prevent imminent or life-threatening deterioration of the following conditions:  Respiratory failure   Critical care was time spent personally by me on the following activities:  Development of treatment plan with patient or surrogate, discussions with consultants, evaluation of patient's response to treatment, examination of patient, obtaining history from patient or surrogate, ordering and performing treatments and interventions, ordering and review of laboratory studies, ordering and review of radiographic studies, pulse oximetry, re-evaluation of patient's condition and review of old charts      Critical Care performed: yes ____________________________________________   INITIAL IMPRESSION / ASSESSMENT AND PLAN / ED COURSE  Pertinent labs & imaging results that were available during my care of the patient were reviewed by me and considered in my medical decision making (see chart for details).   DDX: Peptic ulcer disease, perforation, gastritis, duodenitis, mass, diverticular bleed, diverticulitis, hemorrhoid  Rhonda Ingram is a 86 y.o. who presents to the ED with presentation as described above presenting with bloody stools and melanotic stools with presentation concerning for acute blood loss anemia secondary to GI bleed.  Otherwise hemodynamically stable at this time but is pale.  Based on her age and concern for GI bleed will transfuse.  Will place on Protonix drip will order CT imaging to evaluate and further differentiate her presentation.  Clinical Course as of Sep 22 2339  Wed Sep 21, 2020  2339 CT imaging does not show any evidence of perforation does have extensive diverticular disease she not having evidence of active bleeding is hemodynamically stable will discuss with hospitalist for admission.   [PR]    Clinical Course User Index [PR] Willy Eddy, MD    The patient was evaluated in Emergency Department today for the symptoms described in the history of present illness. He/she was evaluated in the context of the global COVID-19 pandemic, which necessitated consideration that the patient might be at risk for infection with the SARS-CoV-2 virus that causes COVID-19. Institutional protocols and algorithms that pertain to the evaluation  of patients at risk for COVID-19 are in a state of rapid change based on information released by regulatory bodies including the CDC and federal and state organizations. These policies and algorithms were followed during the patient's care in the ED.  As part of my medical decision making, I reviewed the following data within the electronic MEDICAL RECORD NUMBER Nursing notes reviewed and incorporated, Labs reviewed, notes from prior ED visits and [place] Controlled Substance Database   ____________________________________________   FINAL CLINICAL IMPRESSION(S) / ED DIAGNOSES  Final diagnoses:  Gastrointestinal hemorrhage, unspecified gastrointestinal hemorrhage type      NEW MEDICATIONS STARTED DURING THIS VISIT:  New Prescriptions   No medications on file     Note:  This document was prepared using Dragon voice recognition software and may include unintentional dictation errors.    Willy Eddy, MD 09/21/20 [phone]

## 2020-09-21 NOTE — ED Notes (Signed)
Patient transported to CT 

## 2020-09-21 NOTE — Assessment & Plan Note (Signed)
Acute since early Monday morning, with associated weakness, abdominal pain, and decreased appetite. H/H today shows decline from past level, last labs HGB 12, today 7.9 and HCT 25.3.  Concern for GI bleed based on HPI and risk due to age, history of bleed, and use of Meloxicam daily.  Due to current associated symptoms and noted anemia + history of bleed in April will have patient go to ER for further assessment.  Daughter is with her today at visit and will take her there from the office.  They agree with this plan of care and will follow-up after hospital visit.

## 2020-09-21 NOTE — Progress Notes (Signed)
BP 133/74    Pulse 88    Temp 98.2 F (36.8 C)    Wt 113 lb 3.2 oz (51.3 kg)    LMP  (LMP Unknown)    SpO2 96%    BMI 22.11 kg/m    Subjective:    Patient ID: Rhonda Ingram, female    DOB: Jun 03, 1926, 86 y.o.   MRN: [id number]  HPI: Rhonda Ingram is a 86 y.o. female  Chief Complaint  Patient presents with   Rectal Pain    pt states she was constipated on sunday 11/7 pt states since then she has been bleeding from her rectum. Pt states she has lower back pain as well    ABDOMINAL PAIN  Reports she was constipated on Sunday, then Monday morning she strained and had "hemorrhoid blood".  Since this time has had rectal bleeding.  Reports bleeding has been pure red and black blood -- still bleeding this morning.  She reports "a lot of blood" every time she has BM, several times a day.  Last night about 11:30 pm to 4:30 am had several episodes of BM and bleeding.  Has been having discomfort in lower abdomen and lower back.  Has been feeling weaker past 2 days and has not had any appetite -- only eating soup.  Has had GI bleed in past -- back in April 2021.  Also has history of rectal bleeding episodes with hemorrhoids in past. Duration:days Onset: sudden Severity: 8/10 at worst -- continues to have some mild discomfort Quality: dull and aching Location:  suprapubic". "lower abdominal quadrants  Episode duration:  Radiation: no Frequency: constant with a lot of gas Alleviating factors: nothing Aggravating factors: nothing Status: fluctuating Treatments attempted: Protonix Fever: no Nausea: today Vomiting: none Weight loss: no Decreased appetite: yes Diarrhea: no Constipation: no Blood in stool: yes Heartburn: no Jaundice: no Rash: no Dysuria/urinary frequency: no Hematuria: no History of sexually transmitted disease: no Recurrent NSAID use: use Meloxicam every day at home  Relevant past medical, surgical, family and social history reviewed and updated as indicated.  Interim medical history since our last visit reviewed. Allergies and medications reviewed and updated.  Review of Systems  Constitutional: Positive for fatigue. Negative for activity change, appetite change, diaphoresis and fever.  Respiratory: Negative for cough, chest tightness, shortness of breath and wheezing.   Cardiovascular: Negative for chest pain, palpitations and leg swelling.  Gastrointestinal: Positive for abdominal pain, blood in stool and nausea. Negative for abdominal distention, constipation, diarrhea, rectal pain and vomiting.  Neurological: Positive for dizziness and weakness. Negative for syncope, light-headedness, numbness and headaches.  Psychiatric/Behavioral: Negative.     Per HPI unless specifically indicated above     Objective:    BP 133/74    Pulse 88    Temp 98.2 F (36.8 C)    Wt 113 lb 3.2 oz (51.3 kg)    LMP  (LMP Unknown)    SpO2 96%    BMI 22.11 kg/m   Wt Readings from Last 3 Encounters:  09/21/20 113 lb 3.2 oz (51.3 kg)  08/03/20 113 lb (51.3 kg)  05/13/20 111 lb (50.3 kg)    Physical Exam Vitals and nursing note reviewed.  Constitutional:      General: She is awake. She is not in acute distress.    Appearance: She is well-developed and well-groomed. She is not ill-appearing or toxic-appearing.  HENT:     Head: Normocephalic.     Right Ear: Hearing normal.  Left Ear: Hearing normal.  Eyes:     General: Lids are normal.        Right eye: No discharge.        Left eye: No discharge.     Conjunctiva/sclera: Conjunctivae normal.     Pupils: Pupils are equal, round, and reactive to light.  Neck:     Thyroid: No thyromegaly.     Vascular: No carotid bruit.  Cardiovascular:     Rate and Rhythm: Normal rate and regular rhythm.     Heart sounds: Normal heart sounds. No murmur heard.  No gallop.   Pulmonary:     Effort: Pulmonary effort is normal. No accessory muscle usage or respiratory distress.     Breath sounds: Normal breath sounds.    Abdominal:     General: Bowel sounds are decreased. There is no distension.     Palpations: Abdomen is soft. There is no hepatomegaly or splenomegaly.     Tenderness: There is abdominal tenderness in the right upper quadrant. There is no right CVA tenderness, left CVA tenderness, guarding or rebound.  Genitourinary:    Comments: Unable to perform rectal exam, patient with weakness and unable to get up on exam table. Musculoskeletal:     Cervical back: Normal range of motion and neck supple.     Right lower leg: No edema.     Left lower leg: No edema.  Skin:    General: Skin is warm and dry.     Comments: Palmar creases slightly pale.  Neurological:     Mental Status: She is alert and oriented to person, place, and time.  Psychiatric:        Attention and Perception: Attention normal.        Mood and Affect: Mood normal.        Speech: Speech normal.        Behavior: Behavior normal. Behavior is cooperative.        Thought Content: Thought content normal.     Results for orders placed or performed in visit on 09/21/20  CBC With Differential/Platelet  Result Value Ref Range   WBC 7.9 3.4 - 10.8 x10E3/uL   RBC 2.94 (L) 3.77 - 5.28 x10E6/uL   Hemoglobin 7.9 (L) 11.1 - 15.9 g/dL   Hematocrit 39.0 (L) 30.0 - 46.6 %   MCV 86 79 - 97 fL   MCH 26.9 26.6 - 33.0 pg   MCHC 31.2 (L) 31 - 35 g/dL   RDW 92.3 30.0 - 76.2 %   Platelets 251 150 - 450 x10E3/uL   Neutrophils 74 Not Estab. %   Lymphs 17 Not Estab. %   MID 9 Not Estab. %   Neutrophils Absolute 5.9 1.40 - 7.00 x10E3/uL   Lymphocytes Absolute 1.3 0 - 3 x10E3/uL   MID (Absolute) 0.7 0.1 - 1.6 X10E3/uL      Assessment & Plan:   Problem List Items Addressed This Visit      Digestive   Rectal bleeding - Primary    Acute since early Monday morning, with associated weakness, abdominal pain, and decreased appetite. H/H today shows decline from past level, last labs HGB 12, today 7.9 and HCT 25.3.  Concern for GI bleed based on  HPI and risk due to age, history of bleed, and use of Meloxicam daily.  Due to current associated symptoms and noted anemia + history of bleed in April will have patient go to ER for further assessment.  Daughter is with her today at  visit and will take her there from the office.  They agree with this plan of care and will follow-up after hospital visit.      Relevant Orders   CBC With Differential/Platelet (Completed)   Comprehensive metabolic panel   Fecal occult blood, imunochemical       Follow up plan: Return for after hospital visit.

## 2020-09-21 NOTE — Telephone Encounter (Signed)
Pt. Reports she has had rectal bleeding x 3 days. States she has had this in the past and it "was hemorrhoids." States blood is dark red and bright red. No constipation. No fever. Decreased appetite. Appointment made for today.  Reason for Disposition . MODERATE rectal bleeding (small blood clots, passing blood without stool, or toilet water turns red)  Answer Assessment - Initial Assessment Questions 1. APPEARANCE of BLOOD: "What color is it?" "Is it passed separately, on the surface of the stool, or mixed in with the stool?"      Bright red 2. AMOUNT: "How much blood was passed?"      Large  3. FREQUENCY: "How many times has blood been passed with the stools?"      6 4. ONSET: "When was the blood first seen in the stools?" (Days or weeks)      3 days 5. DIARRHEA: "Is there also some diarrhea?" If Yes, ask: "How many diarrhea stools were passed in past 24 hours?"      No 6. CONSTIPATION: "Do you have constipation?" If Yes, ask: "How bad is it?"     No 7. RECURRENT SYMPTOMS: "Have you had blood in your stools before?" If Yes, ask: "When was the last time?" and "What happened that time?"      Yes 8. BLOOD THINNERS: "Do you take any blood thinners?" (e.g., Coumadin/warfarin, Pradaxa/dabigatran, aspirin)     No 9. OTHER SYMPTOMS: "Do you have any other symptoms?"  (e.g., abdominal pain, vomiting, dizziness, fever)     No 10. PREGNANCY: "Is there any chance you are pregnant?" "When was your last menstrual period?"       No  Protocols used: RECTAL BLEEDING-A-AH

## 2020-09-22 DIAGNOSIS — I10 Essential (primary) hypertension: Secondary | ICD-10-CM | POA: Diagnosis not present

## 2020-09-22 DIAGNOSIS — G8929 Other chronic pain: Secondary | ICD-10-CM | POA: Diagnosis not present

## 2020-09-22 DIAGNOSIS — K625 Hemorrhage of anus and rectum: Secondary | ICD-10-CM

## 2020-09-22 DIAGNOSIS — D62 Acute posthemorrhagic anemia: Secondary | ICD-10-CM | POA: Diagnosis not present

## 2020-09-22 DIAGNOSIS — K922 Gastrointestinal hemorrhage, unspecified: Secondary | ICD-10-CM | POA: Diagnosis not present

## 2020-09-22 DIAGNOSIS — E559 Vitamin D deficiency, unspecified: Secondary | ICD-10-CM | POA: Diagnosis not present

## 2020-09-22 DIAGNOSIS — M549 Dorsalgia, unspecified: Secondary | ICD-10-CM | POA: Diagnosis not present

## 2020-09-22 DIAGNOSIS — K921 Melena: Secondary | ICD-10-CM

## 2020-09-22 LAB — COMPREHENSIVE METABOLIC PANEL
ALT: 6 IU/L (ref 0–32)
AST: 16 IU/L (ref 0–40)
Albumin/Globulin Ratio: 1.1 — ABNORMAL LOW (ref 1.2–2.2)
Albumin: 3.4 g/dL — ABNORMAL LOW (ref 3.5–4.6)
Alkaline Phosphatase: 67 IU/L (ref 44–121)
BUN/Creatinine Ratio: 39 — ABNORMAL HIGH (ref 12–28)
BUN: 36 mg/dL (ref 10–36)
Bilirubin Total: <0.2 mg/dL (ref 0.0–1.2)
CO2: 22 mmol/L (ref 20–29)
Calcium: 9 mg/dL (ref 8.7–10.3)
Chloride: 105 mmol/L (ref 96–106)
Creatinine, Ser: 0.93 mg/dL (ref 0.57–1.00)
GFR calc Af Amer: 62 mL/min/{1.73_m2} (ref >59)
GFR calc non Af Amer: 54 mL/min/{1.73_m2} — ABNORMAL LOW (ref >59)
Globulin, Total: 3.1 g/dL (ref 1.5–4.5)
Glucose: 153 mg/dL — ABNORMAL HIGH (ref 65–99)
Potassium: 4.6 mmol/L (ref 3.5–5.2)
Sodium: 139 mmol/L (ref 134–144)
Total Protein: 6.5 g/dL (ref 6.0–8.5)

## 2020-09-22 LAB — TYPE AND SCREEN
ABO/RH(D): A POS
Antibody Screen: NEGATIVE
Unit division: 0

## 2020-09-22 LAB — CBC
HCT: 31 % — ABNORMAL LOW (ref 36.0–46.0)
Hemoglobin: 9.9 g/dL — ABNORMAL LOW (ref 12.0–15.0)
MCH: 26.1 pg (ref 26.0–34.0)
MCHC: 31.9 g/dL (ref 30.0–36.0)
MCV: 81.8 fL (ref 80.0–100.0)
Platelets: 223 10*3/uL (ref 150–400)
RBC: 3.79 MIL/uL — ABNORMAL LOW (ref 3.87–5.11)
RDW: 17.3 % — ABNORMAL HIGH (ref 11.5–15.5)
WBC: 9.9 10*3/uL (ref 4.0–10.5)
nRBC: 0 % (ref 0.0–0.2)

## 2020-09-22 LAB — IRON AND TIBC
Iron: 153 ug/dL (ref 28–170)
Saturation Ratios: 42 % — ABNORMAL HIGH (ref 10.4–31.8)
TIBC: 361 ug/dL (ref 250–450)
UIBC: 208 ug/dL

## 2020-09-22 LAB — FERRITIN: Ferritin: 13 ng/mL (ref 11–307)

## 2020-09-22 LAB — BASIC METABOLIC PANEL
Anion gap: 6 (ref 5–15)
BUN: 29 mg/dL — ABNORMAL HIGH (ref 8–23)
CO2: 26 mmol/L (ref 22–32)
Calcium: 8.5 mg/dL — ABNORMAL LOW (ref 8.9–10.3)
Chloride: 108 mmol/L (ref 98–111)
Creatinine, Ser: 0.73 mg/dL (ref 0.44–1.00)
GFR, Estimated: >60 mL/min (ref >60)
Glucose, Bld: 113 mg/dL — ABNORMAL HIGH (ref 70–99)
Potassium: 3.9 mmol/L (ref 3.5–5.1)
Sodium: 140 mmol/L (ref 135–145)

## 2020-09-22 LAB — BPAM RBC
Blood Product Expiration Date: 202112032359
ISSUE DATE / TIME: 202111102336
Unit Type and Rh: 6200

## 2020-09-22 LAB — FOLATE: Folate: 23 ng/mL (ref >5.9)

## 2020-09-22 LAB — RESPIRATORY PANEL BY RT PCR (FLU A&B, COVID)
Influenza A by PCR: NEGATIVE
Influenza B by PCR: NEGATIVE
SARS Coronavirus 2 by RT PCR: NEGATIVE

## 2020-09-22 LAB — VITAMIN B12: Vitamin B-12: 380 pg/mL (ref 180–914)

## 2020-09-22 MED ORDER — SODIUM CHLORIDE 0.9 % IV SOLN
400.0000 mg | Freq: Once | INTRAVENOUS | Status: AC
  Administered 2020-09-22: 400 mg via INTRAVENOUS
  Filled 2020-09-22: qty 20

## 2020-09-22 MED ORDER — FLEET ENEMA 7-19 GM/118ML RE ENEM
1.0000 | ENEMA | Freq: Once | RECTAL | Status: AC
  Administered 2020-09-23: 1 via RECTAL

## 2020-09-22 MED ORDER — CYCLOBENZAPRINE HCL 10 MG PO TABS: 5.0000 mg | ORAL_TABLET | Freq: Three times a day (TID) | ORAL | Status: DC | PRN

## 2020-09-22 MED ORDER — BENAZEPRIL HCL 5 MG PO TABS
5.0000 mg | ORAL_TABLET | Freq: Every day | ORAL | Status: DC
  Administered 2020-09-23 – 2020-09-24 (×2): 5 mg via ORAL
  Filled 2020-09-22 (×3): qty 1

## 2020-09-22 MED ORDER — FLEET ENEMA 7-19 GM/118ML RE ENEM: 1.0000 | ENEMA | Freq: Once | RECTAL | Status: DC

## 2020-09-22 NOTE — ED Notes (Signed)
Pt awake and alert with no complaints.

## 2020-09-22 NOTE — Progress Notes (Signed)
Patient ID: Rhonda Ingram, female   DOB: 11-Sep-1926, 86 y.o.   MRN: [id number] [facility] PROGRESS NOTE  Rebekka Lobello [facility] SJG:[id number] DOB: 10/23/1926 DOA: 09/21/2020 PCP: Marjie Skiff, NP  HPI/Subjective: Patient has been having some bleeding going on for a few days.  Couple days ago did see some dark black stool.  Also did see bright red blood per rectum.  The patient states that she thinks it has a lot of blood.  She was unable to quantify how much blood was coming out.  Patient did receive a unit of packed red blood cells.  Patient interested in making sure she does not have a cancer.  Objective: Vitals:   09/22/20 1000 09/22/20 1100  BP: (!) 167/81 (!) 142/66  Pulse:    Resp: 19 15  Temp:    SpO2:      Intake/Output Summary (Last 24 hours) at 09/22/2020 1258 Last data filed at 09/22/2020 0153 Gross per 24 hour  Intake 320 ml  Output --  Net 320 ml   Filed Weights   09/21/20 1755  Weight: 51.7 kg    ROS: Review of Systems  Respiratory: Negative for cough and shortness of breath.   Cardiovascular: Negative for chest pain.  Gastrointestinal: Positive for blood in stool and melena. Negative for abdominal pain, nausea and vomiting.   Exam: Physical Exam HENT:     Head: Normocephalic.     Mouth/Throat:     Pharynx: No oropharyngeal exudate.  Eyes:     General: Lids are normal.     Conjunctiva/sclera: Conjunctivae normal.     Pupils: Pupils are equal, round, and reactive to light.  Cardiovascular:     Rate and Rhythm: Normal rate and regular rhythm.     Heart sounds: Normal heart sounds, S1 normal and S2 normal.  Pulmonary:     Breath sounds: No decreased breath sounds, wheezing, rhonchi or rales.  Abdominal:     Palpations: Abdomen is soft.     Tenderness: There is no abdominal tenderness.  Musculoskeletal:     Right ankle: No swelling.     Left ankle: No swelling.  Skin:    General: Skin is warm.     Findings: No lesion.  Neurological:      Mental Status: She is alert and oriented to person, place, and time.       Data Reviewed: Basic Metabolic Panel: Recent Labs  Lab 09/21/20 1604 09/21/20 1802 09/22/20 0428  NA 139 136 140  K 4.6 4.2 3.9  CL 105 103 108  CO2 22 25 26   GLUCOSE 153* 152* 113*  BUN 36 40* 29*  CREATININE 0.93 0.81 0.73  CALCIUM 9.0 8.6* 8.5*   Liver Function Tests: Recent Labs  Lab 09/21/20 1604 09/21/20 1802  AST 16 19  ALT 6 10  ALKPHOS 67 55  BILITOT <0.2 0.3  PROT 6.5 6.9  ALBUMIN 3.4* 3.2*   CBC: Recent Labs  Lab 09/21/20 1602 09/21/20 1802 09/22/20 0428  WBC 7.9 8.3 9.9  NEUTROABS 5.9  --   --   HGB 7.9* 8.0* 9.9*  HCT 25.3* 25.3* 31.0*  MCV 86 86.9 81.8  PLT 251 239 223     Recent Results (from the past 240 hour(s))  Respiratory Panel by RT PCR (Flu A&B, Covid) - Nasopharyngeal Swab     Status: None   Collection Time: 09/22/20  1:03 AM   Specimen: Nasopharyngeal Swab  Result Value Ref Range Status   SARS Coronavirus 2 by  RT PCR NEGATIVE NEGATIVE Final    Comment: (NOTE) SARS-CoV-2 target nucleic acids are NOT DETECTED.  The SARS-CoV-2 RNA is generally detectable in upper respiratoy specimens during the acute phase of infection. The lowest concentration of SARS-CoV-2 viral copies this assay can detect is 131 copies/mL. A negative result does not preclude SARS-Cov-2 infection and should not be used as the sole basis for treatment or other patient management decisions. A negative result may occur with  improper specimen collection/handling, submission of specimen other than nasopharyngeal swab, presence of viral mutation(s) within the areas targeted by this assay, and inadequate number of viral copies (<131 copies/mL). A negative result must be combined with clinical observations, patient history, and epidemiological information. The expected result is Negative.  Fact Sheet for Patients:  https://www.moore.com/  Fact Sheet for Healthcare  Providers:  https://www.young.biz/  This test is no t yet approved or cleared by the [place] FDA and  has been authorized for detection and/or diagnosis of SARS-CoV-2 by FDA under an Emergency Use Authorization (EUA). This EUA will remain  in effect (meaning this test can be used) for the duration of the COVID-19 declaration under Section 564(b)(1) of the Act, 21 U.S.C. section 360bbb-3(b)(1), unless the authorization is terminated or revoked sooner.     Influenza A by PCR NEGATIVE NEGATIVE Final   Influenza B by PCR NEGATIVE NEGATIVE Final    Comment: (NOTE) The Xpert Xpress SARS-CoV-2/FLU/RSV assay is intended as an aid in  the diagnosis of influenza from Nasopharyngeal swab specimens and  should not be used as a sole basis for treatment. Nasal washings and  aspirates are unacceptable for Xpert Xpress SARS-CoV-2/FLU/RSV  testing.  Fact Sheet for Patients: https://www.moore.com/  Fact Sheet for Healthcare Providers: https://www.young.biz/  This test is not yet approved or cleared by the [place] FDA and  has been authorized for detection and/or diagnosis of SARS-CoV-2 by  FDA under an Emergency Use Authorization (EUA). This EUA will remain  in effect (meaning this test can be used) for the duration of the  Covid-19 declaration under Section 564(b)(1) of the Act, 21  U.S.C. section 360bbb-3(b)(1), unless the authorization is  terminated or revoked. Performed at [facility], [address], [place], [place] [address]      Studies: CT ABDOMEN PELVIS W CONTRAST  Result Date: 09/21/2020 CLINICAL DATA:  Diverticulitis suspected. Epigastric pain. Rectal bleeding x2 days. EXAM: CT ABDOMEN AND PELVIS WITH CONTRAST TECHNIQUE: Multidetector CT imaging of the abdomen and pelvis was performed using the standard protocol following bolus administration of intravenous contrast. CONTRAST:  49mL OMNIPAQUE IOHEXOL  300 MG/ML  SOLN COMPARISON:  02/18/2020 FINDINGS: Lower chest: The lung bases are clear. The heart size is normal. Hepatobiliary: The liver is normal. Status post cholecystectomy.There is mild intrahepatic and extrahepatic biliary ductal dilatation which is stable from prior study. Pancreas: Normal contours without ductal dilatation. No peripancreatic fluid collection. Spleen: Unremarkable. Adrenals/Urinary Tract: --Adrenal glands: Unremarkable. --Right kidney/ureter: No hydronephrosis or radiopaque kidney stones. --Left kidney/ureter: No hydronephrosis or radiopaque kidney stones. --Urinary bladder: Unremarkable. Stomach/Bowel: --Stomach/Duodenum: No hiatal hernia or other gastric abnormality. Normal duodenal course and caliber. --Small bowel: Unremarkable. --Colon: There is severe sigmoid diverticulosis. --Appendix: Normal. Vascular/Lymphatic: Atherosclerotic calcification is present within the non-aneurysmal abdominal aorta, without hemodynamically significant stenosis. --No retroperitoneal lymphadenopathy. --No mesenteric lymphadenopathy. --No pelvic or inguinal lymphadenopathy. Reproductive: Unremarkable Other: No ascites or free air. The abdominal wall is normal. Musculoskeletal. No acute displaced fractures. IMPRESSION: 1. No acute abdominopelvic abnormality. 2. Severe sigmoid diverticulosis  without CT evidence for diverticulitis. Aortic Atherosclerosis (ICD10-I70.0). Electronically Signed   By: Katherine Mantle M.D.   On: 09/21/2020 23:26    Scheduled Meds: Continuous Infusions: . pantoprozole (PROTONIX) infusion 8 mg/hr (09/22/20 0837)    Assessment/Plan:  1. Acute blood loss anemia.  Patient was given 1 unit of packed red blood cells in the emergency room.  Hemoglobin came up from 8-9.9.  Continue to monitor.  Hold Mobic. 2. Melena with also bright red blood per rectum.  GI to evaluate to decide on procedures.  May end up needing EGD and colonoscopy.  Patient currently on Protonix drip.  We  will give clear liquid diet today and n.p.o. after midnight. 3. Essential hypertension can go back on Lotensin 4. Chronic back pain on as needed Flexeril.  Hold Mobic. 5. Vitamin D deficiency can go back on vitamin D as outpatient      Code Status:     Code Status Orders  (From admission, onward)         Start     Ordered   09/21/20 2351  Full code  Continuous        09/21/20 2352        Code Status History    This patient has a current code status but no historical code status.   Advance Care Planning Activity     Family Communication: Left message for daughter on the phone.  Unable to reach granddaughter on the phone. Disposition Plan: Status is: Observation  Dispo: The patient is from: Home              Anticipated d/c is to: Home              Anticipated d/c date is: 09/23/2020              Patient currently being evaluated by GI and potential procedures tomorrow.  Watching hemoglobin closely.  Consultants:  Gastroenterology  Time spent: 28 minutes  Lucious [employer]

## 2020-09-22 NOTE — ED Notes (Signed)
Pt resting/sleeping. Monitors on. IV fluids and meds infusion.

## 2020-09-22 NOTE — ED Notes (Signed)
Pt called out to notified staff she is unable to use the external catheter that was in place. Pt denies any dizziness/lightheaded feelings, and assisted to toilet in room. When pt first up at bedside, pt experienced urgency incontinence upon standing. After initial incontinent episode, pt able to walk without difficulty to toilet to and finish urinating in toilet.

## 2020-09-22 NOTE — Evaluation (Signed)
Physical Therapy Evaluation Patient Details Name: Rhonda Ingram MRN: [id number] DOB: 1926/03/25 Today's Date: 09/22/2020   History of Present Illness  Pt admitted for acute blood loss anemia with complaints of rectal bleeding x 2 days.   Clinical Impression  Pt is a pleasant 86 year old female who was admitted for complaints of rectal bleeding. Pt demonstrates all bed mobility/transfers/ambulation at baseline level. Once in room, unable to locate RN to disconnect IV from pole to allow further ambulation, therefore all evaluation performed in room. Pt is very active and has great family support. Does not use AD at baseline. Currently being worked up for GI bleed.  Pt does not require any further PT needs at this time. Pt will be dc in house and does not require follow up. RN aware. Will dc current orders.     Follow Up Recommendations No PT follow up    Equipment Recommendations  None recommended by PT    Recommendations for Other Services       Precautions / Restrictions Precautions Precautions: Fall Restrictions Weight Bearing Restrictions: No      Mobility  Bed Mobility Overal bed mobility: Needs Assistance Bed Mobility: Supine to Sit     Supine to sit: Min assist     General bed mobility comments: needs slight assist for trunk mobility. Once seated at EOB, able to sit with upright posture    Transfers Overall transfer level: Modified independent Equipment used: None             General transfer comment: safe technique with ease of movement  Ambulation/Gait Ambulation/Gait assistance: Supervision Gait Distance (Feet): 12 Feet Assistive device: None Gait Pattern/deviations: Step-to pattern     General Gait Details: due to pt connected to IV (fixed to bed), only able to ambulate at bedside. Safe technique with pt able to complete multiple turns without LOB noted  Stairs            Wheelchair Mobility    Modified Rankin (Stroke Patients Only)        Balance Overall balance assessment: Modified Independent                                           Pertinent Vitals/Pain Pain Assessment: No/denies pain    Home Living Family/patient expects to be discharged to:: Private residence Living Arrangements: Other relatives Engineer, building services) Available Help at Discharge: Family Type of Home: House Home Access: Ramped entrance;Stairs to enter Entrance Stairs-Rails: None Entrance Stairs-Number of Steps: 1 Home Layout: One level Home Equipment: Walker - 2 wheels      Prior Function Level of Independence: Independent         Comments: reports 1 fall due to heeled shoes sinking in soggy grass causing her to fall. Pt is very active, enjoys working outside in the yard and playing with her dogs     Hand Dominance   Dominant Hand: Right    Extremity/Trunk Assessment   Upper Extremity Assessment Upper Extremity Assessment: Generalized weakness (B UE grossly 4/5; pt reports this is baseline)    Lower Extremity Assessment Lower Extremity Assessment: Overall WFL for tasks assessed       Communication   Communication: No difficulties  Cognition Arousal/Alertness: Awake/alert Behavior During Therapy: WFL for tasks assessed/performed Overall Cognitive Status: Within Functional Limits for tasks assessed  General Comments      Exercises Other Exercises Other Exercises: ambulated to [facility] for BM. Able to perform self hygiene with supervision. Black stool, RN aware.  Other Exercises: Performed 2 sit<>Stands from straight chair with supervision. Also performed seated LAQ x 10 on B LE with supervision   Assessment/Plan    PT Assessment Patent does not need any further PT services  PT Problem List         PT Treatment Interventions      PT Goals (Current goals can be found in the Care Plan section)  Acute Rehab PT Goals Patient Stated Goal: to go  home PT Goal Formulation: All assessment and education complete, DC therapy Time For Goal Achievement: 09/22/20 Potential to Achieve Goals: Good    Frequency     Barriers to discharge        Co-evaluation               AM-PAC PT "6 Clicks" Mobility  Outcome Measure Help needed turning from your back to your side while in a flat bed without using bedrails?: None Help needed moving from lying on your back to sitting on the side of a flat bed without using bedrails?: A Little Help needed moving to and from a bed to a chair (including a wheelchair)?: None Help needed standing up from a chair using your arms (e.g., wheelchair or bedside chair)?: None Help needed to walk in hospital room?: None Help needed climbing 3-5 steps with a railing? : None 6 Click Score: 23    End of Session   Activity Tolerance: Patient tolerated treatment well Patient left: in bed Nurse Communication: Mobility status PT Visit Diagnosis: Muscle weakness (generalized) (M62.81)    Time: 7858-8502 PT Time Calculation (min) (ACUTE ONLY): 29 min   Charges:   PT Evaluation $PT Eval Low Complexity: 1 Low PT Treatments $Therapeutic Activity: 8-22 mins        Elizabeth Palau, PT, DPT [phone]   Debany Vantol 09/22/2020, 2:08 PM

## 2020-09-22 NOTE — Consult Note (Signed)
[name] , MD [address], [place], [place], [address] [address], [address], [place], [place], [address] Phone: [phone]  Fax: [phone]  Consultation  Referring Provider:     Dr Renae Gloss Primary Care Physician:  Marjie Skiff, NP Primary Gastroenterologist:  Dr. Norma Fredrickson         Reason for Consultation:     GI bleed  Date of Admission:  09/21/2020 Date of Consultation:  09/22/2020         HPI:   Rhonda Ingram is a 86 y.o. female has previously been seen by Dr Jenel Lucks team on 02/23/2020 at their office for rectal bleeding . Attributed bleeding to hemorrhoids . In 02/2020 Hb 10 grams with MCV 89.   She presented to the ER last night with black stool and blood in the stool. She has been on Meloxicam for arthritis    She underwent a CT scan of the abdomen yesterday that showed severe sigmoid diverticulosis. Hb 7.9 grams on admission. BUN 40 and creatinine 0.81.    No recent endoscopic procedures. Says she developed lower abdominal pain for 2 days beginning Tuesday (todayis Thursday) nopain today , initially had tarry black stool and then was bright red, been taking one meloxicam daily for months , no PPI.  Past Medical History:  Diagnosis Date  . Arthritis    osteoarthritis  . Hyperlipidemia   . Hypertension   . Osteoporosis   . Vitamin D deficiency     Past Surgical History:  Procedure Laterality Date  . CHOLECYSTECTOMY    . EYE SURGERY     cataract extraction  . VENTRAL HERNIA REPAIR N/A 10/08/2016   Procedure: HERNIA REPAIR VENTRAL ADULT;  Surgeon: Kieth Brightly, MD;  Location: [facility] ORS;  Service: General;  Laterality: N/A;    Prior to Admission medications   Medication Sig Start Date End Date Taking? Authorizing Provider  benazepril (LOTENSIN) 5 MG tablet Take 1 tablet (5 mg total) by mouth daily. 08/03/20  Yes Johnson, Megan P, DO  cyclobenzaprine (FLEXERIL) 5 MG tablet Take 5 mg by mouth as needed. 06/02/20  Yes [provider]  fluticasone (FLONASE) 50 MCG/ACT nasal spray Place 1-2 sprays into both nostrils daily as needed (for allergies.).  08/16/16  Yes [provider]  meloxicam (MOBIC) 7.5 MG tablet Take 1 tablet (7.5 mg total) by mouth daily. 08/03/20  Yes Johnson, Megan P, DO  pantoprazole (PROTONIX) 40 MG tablet Take 1 tablet (40 mg total) by mouth daily. 08/03/20 08/03/21 Yes Johnson, Megan P, DO  triamcinolone cream (KENALOG) 0.1 % APPLY TWICE DAILY AS DIRECTED 03/13/19  Yes Crissman, Redge Gainer, MD  VITAMIN D PO Take by mouth daily.   Yes [provider]  diphenhydrAMINE (BENADRYL) 25 MG tablet Take 25 mg by mouth every 6 (six) hours as needed. Taking 1/2 tablet to 1 tablet prn at night for sleep Patient not taking: Reported on 09/21/2020    [provider]    Family History  Problem Relation Age of Onset  . Hypertension Mother   . Stroke Mother   . Cancer Father        lung  . Hypertension Sister   . Hyperlipidemia Sister   . Hypertension Daughter   . Cerebral palsy Daughter   . Hypertension Maternal Grandmother   . Hypertension Maternal Grandfather   . Hypertension Paternal Grandmother   . Hypertension Paternal Grandfather   . Hypertension Sister   . Hyperlipidemia Sister   .  Migraines Daughter   . Diabetes Brother   . Hypertension Brother   . Hyperlipidemia Brother      Social History   Tobacco Use  . Smoking status: Never Smoker  . Smokeless tobacco: Never Used  Vaping Use  . Vaping Use: Never used  Substance Use Topics  . Alcohol use: No  . Drug use: No    Allergies as of 09/21/2020 - Review Complete 09/21/2020  Allergen Reaction Noted  . Alpha-d-galactosidase Itching and Shortness Of Breath 02/23/2020  . Meat extract Shortness Of Breath 10/08/2016  . Other  09/25/2016    Review of Systems:    All systems reviewed and negative except where noted in HPI.   Physical Exam:  Vital signs in last 24 hours: Temp:  [98.2 F (36.8 C)-99.2 F  (37.3 C)] 98.5 F (36.9 C) (11/11 0937) Pulse Rate:  [69-93] 72 (11/11 0937) Resp:  [14-26] 23 (11/11 1330) BP: (129-188)/(59-168) 163/68 (11/11 1330) SpO2:  [96 %-100 %] 97 % (11/11 1330) Weight:  [51.3 kg-51.7 kg] 51.7 kg (11/10 1755)   General:   Pleasant, cooperative in NAD Head:  Normocephalic and atraumatic. Eyes:   No icterus.   Conjunctiva pink. PERRLA. Ears:  Normal auditory acuity. Neck:  Supple; no masses or thyroidomegaly Lungs: Respirations even and unlabored. Lungs clear to auscultation bilaterally.   No wheezes, crackles, or rhonchi.  Heart:  Regular rate and rhythm;  Without murmur, clicks, rubs or gallops Abdomen:  Soft, nondistended, nontender. Normal bowel sounds. No appreciable masses or hepatomegaly.  No rebound or guarding.  Neurologic:  Alert and oriented x3;  grossly normal neurologically. Skin:  Intact without significant lesions or rashes. Cervical Nodes:  No significant cervical adenopathy. Psych:  Alert and cooperative. Normal affect.  LAB RESULTS: Recent Labs    09/21/20 1602 09/21/20 1802 09/22/20 0428  WBC 7.9 8.3 9.9  HGB 7.9* 8.0* 9.9*  HCT 25.3* 25.3* 31.0*  PLT 251 239 223   BMET Recent Labs    09/21/20 1604 09/21/20 1802 09/22/20 0428  NA 139 136 140  K 4.6 4.2 3.9  CL 105 103 108  CO2 22 25 26   GLUCOSE 153* 152* 113*  BUN 36 40* 29*  CREATININE 0.93 0.81 0.73  CALCIUM 9.0 8.6* 8.5*   LFT Recent Labs    09/21/20 1802  PROT 6.9  ALBUMIN 3.2*  AST 19  ALT 10  ALKPHOS 55  BILITOT 0.3   PT/INR No results for input(s): LABPROT, INR in the last 72 hours.  STUDIES: CT ABDOMEN PELVIS W CONTRAST  Result Date: 09/21/2020 CLINICAL DATA:  Diverticulitis suspected. Epigastric pain. Rectal bleeding x2 days. EXAM: CT ABDOMEN AND PELVIS WITH CONTRAST TECHNIQUE: Multidetector CT imaging of the abdomen and pelvis was performed using the standard protocol following bolus administration of intravenous contrast. CONTRAST:  50mL  OMNIPAQUE IOHEXOL 300 MG/ML  SOLN COMPARISON:  02/18/2020 FINDINGS: Lower chest: The lung bases are clear. The heart size is normal. Hepatobiliary: The liver is normal. Status post cholecystectomy.There is mild intrahepatic and extrahepatic biliary ductal dilatation which is stable from prior study. Pancreas: Normal contours without ductal dilatation. No peripancreatic fluid collection. Spleen: Unremarkable. Adrenals/Urinary Tract: --Adrenal glands: Unremarkable. --Right kidney/ureter: No hydronephrosis or radiopaque kidney stones. --Left kidney/ureter: No hydronephrosis or radiopaque kidney stones. --Urinary bladder: Unremarkable. Stomach/Bowel: --Stomach/Duodenum: No hiatal hernia or other gastric abnormality. Normal duodenal course and caliber. --Small bowel: Unremarkable. --Colon: There is severe sigmoid diverticulosis. --Appendix: Normal. Vascular/Lymphatic: Atherosclerotic calcification is present within the non-aneurysmal abdominal aorta, without  hemodynamically significant stenosis. --No retroperitoneal lymphadenopathy. --No mesenteric lymphadenopathy. --No pelvic or inguinal lymphadenopathy. Reproductive: Unremarkable Other: No ascites or free air. The abdominal wall is normal. Musculoskeletal. No acute displaced fractures. IMPRESSION: 1. No acute abdominopelvic abnormality. 2. Severe sigmoid diverticulosis without CT evidence for diverticulitis. Aortic Atherosclerosis (ICD10-I70.0). Electronically Signed   By: Katherine Mantle M.D.   On: 09/21/2020 23:26      Impression / Plan:   Rhonda Ingram is a 86 y.o. y/o female with a history of rectal bleeding seen by Dr Jenel Lucks team in 4.2021 . Attributed to hemorrids, presented to the ER with black stool and blood in the stool . H/o Meloxicam use. Differentials include NSAID related mucosal injury +/- Lower GI bleed.    Plan  1. Stop all NSAID's such as meloxicam 2. IV PPI  3. H pylori stool antigen  4. EGD tomorrow and if negative, flexible  sigmoidoscopy  5. Check iron studies, b12,folate and replace if low.    I have discussed alternative options, risks & benefits,  which include, but are not limited to, bleeding, infection, perforation,respiratory complication & drug reaction.  The patient agrees with this plan & written consent will be obtained.     Thank you for involving me in the care of this patient.      LOS: 0 days   [name], MD  09/22/2020, 2:22 PM

## 2020-09-23 ENCOUNTER — Observation Stay: Payer: Medicare HMO | Admitting: Anesthesiology

## 2020-09-23 ENCOUNTER — Encounter
Admission: EM | Disposition: A | Discharge status: Home / Self Care | Payer: Self-pay | Source: Home / Self Care | Attending: Student in an Organized Health Care Education/Training Program

## 2020-09-23 DIAGNOSIS — K922 Gastrointestinal hemorrhage, unspecified: Secondary | ICD-10-CM | POA: Diagnosis not present

## 2020-09-23 DIAGNOSIS — G8929 Other chronic pain: Secondary | ICD-10-CM | POA: Diagnosis not present

## 2020-09-23 DIAGNOSIS — K625 Hemorrhage of anus and rectum: Secondary | ICD-10-CM | POA: Diagnosis not present

## 2020-09-23 DIAGNOSIS — K921 Melena: Secondary | ICD-10-CM | POA: Diagnosis not present

## 2020-09-23 DIAGNOSIS — E559 Vitamin D deficiency, unspecified: Secondary | ICD-10-CM | POA: Diagnosis not present

## 2020-09-23 DIAGNOSIS — D62 Acute posthemorrhagic anemia: Secondary | ICD-10-CM | POA: Diagnosis not present

## 2020-09-23 DIAGNOSIS — M549 Dorsalgia, unspecified: Secondary | ICD-10-CM | POA: Diagnosis not present

## 2020-09-23 DIAGNOSIS — I10 Essential (primary) hypertension: Secondary | ICD-10-CM | POA: Diagnosis not present

## 2020-09-23 HISTORY — PX: ESOPHAGOGASTRODUODENOSCOPY (EGD) WITH PROPOFOL: SHX5813

## 2020-09-23 HISTORY — PX: FLEXIBLE SIGMOIDOSCOPY: SHX5431

## 2020-09-23 LAB — CBC
HCT: 31.2 % — ABNORMAL LOW (ref 36.0–46.0)
Hemoglobin: 9.9 g/dL — ABNORMAL LOW (ref 12.0–15.0)
MCH: 26 pg (ref 26.0–34.0)
MCHC: 31.7 g/dL (ref 30.0–36.0)
MCV: 81.9 fL (ref 80.0–100.0)
Platelets: 261 10*3/uL (ref 150–400)
RBC: 3.81 MIL/uL — ABNORMAL LOW (ref 3.87–5.11)
RDW: 16.8 % — ABNORMAL HIGH (ref 11.5–15.5)
WBC: 7.6 10*3/uL (ref 4.0–10.5)
nRBC: 0 % (ref 0.0–0.2)

## 2020-09-23 LAB — VITAMIN B12: Vitamin B-12: 323 pg/mL (ref 180–914)

## 2020-09-23 SURGERY — ESOPHAGOGASTRODUODENOSCOPY (EGD) WITH PROPOFOL
Anesthesia: General

## 2020-09-23 MED ORDER — LIDOCAINE HCL (PF) 2 % IJ SOLN
INTRAMUSCULAR | Status: DC | PRN
  Administered 2020-09-23: 60 mg via INTRADERMAL

## 2020-09-23 MED ORDER — PROPOFOL 10 MG/ML IV BOLUS
INTRAVENOUS | Status: DC | PRN
  Administered 2020-09-23: 30 mg via INTRAVENOUS
  Administered 2020-09-23: 20 mg via INTRAVENOUS

## 2020-09-23 MED ORDER — SODIUM CHLORIDE 0.9 % IV SOLN: INTRAVENOUS | Status: DC

## 2020-09-23 MED ORDER — PROPOFOL 500 MG/50ML IV EMUL
INTRAVENOUS | Status: DC | PRN
  Administered 2020-09-23: 60 ug/kg/min via INTRAVENOUS

## 2020-09-23 MED ORDER — PANTOPRAZOLE SODIUM 40 MG PO TBEC
40.0000 mg | DELAYED_RELEASE_TABLET | Freq: Every day | ORAL | Status: DC
  Administered 2020-09-23 – 2020-09-24 (×2): 40 mg via ORAL
  Filled 2020-09-23 (×2): qty 1

## 2020-09-23 NOTE — Anesthesia Preprocedure Evaluation (Signed)
Anesthesia Evaluation  Patient identified by MRN, date of birth, ID band Patient awake    Reviewed: Allergy & Precautions, NPO status , Patient's Chart, lab work & pertinent test results  History of Anesthesia Complications Negative for: history of anesthetic complications  Airway Mallampati: II  TM Distance: >3 FB Neck ROM: Full    Dental  (+) Edentulous Lower, Upper Dentures   Pulmonary neg pulmonary ROS, neg sleep apnea, neg COPD,    breath sounds clear to auscultation- rhonchi (-) wheezing      Cardiovascular hypertension, Pt. on medications (-) CAD, (-) Past MI, (-) Cardiac Stents and (-) CABG  Rhythm:Regular Rate:Normal - Systolic murmurs and - Diastolic murmurs    Neuro/Psych neg Seizures negative neurological ROS  negative psych ROS   GI/Hepatic negative GI ROS, Neg liver ROS,   Endo/Other  negative endocrine ROSneg diabetes  Renal/GU negative Renal ROS     Musculoskeletal  (+) Arthritis ,   Abdominal (+) - obese,   Peds  Hematology  (+) anemia ,   Anesthesia Other Findings Past Medical History: No date: Arthritis     Comment:  osteoarthritis No date: Hyperlipidemia No date: Hypertension No date: Osteoporosis No date: Vitamin D deficiency   Reproductive/Obstetrics                             Anesthesia Physical Anesthesia Plan  ASA: II  Anesthesia Plan: General   Post-op Pain Management:    Induction: Intravenous  PONV Risk Score and Plan: 2 and Propofol infusion  Airway Management Planned: Natural Airway  Additional Equipment:   Intra-op Plan:   Post-operative Plan:   Informed Consent: I have reviewed the patients History and Physical, chart, labs and discussed the procedure including the risks, benefits and alternatives for the proposed anesthesia with the patient or authorized representative who has indicated his/her understanding and acceptance.      Dental advisory given  Plan Discussed with: CRNA and Anesthesiologist  Anesthesia Plan Comments:         Anesthesia Quick Evaluation

## 2020-09-23 NOTE — Plan of Care (Signed)

## 2020-09-23 NOTE — Transfer of Care (Signed)
Immediate Anesthesia Transfer of Care Note  Patient: Rhonda Ingram  Procedure(s) Performed: ESOPHAGOGASTRODUODENOSCOPY (EGD) WITH PROPOFOL (N/A ) FLEXIBLE SIGMOIDOSCOPY (N/A )  Patient Location: PACU  Anesthesia Type:General  Level of Consciousness: awake, alert  and oriented  Airway & Oxygen Therapy: Patient Spontanous Breathing and Patient connected to nasal cannula oxygen  Post-op Assessment: Report given to RN and Post -op Vital signs reviewed and stable  Post vital signs: Reviewed and stable  Last Vitals:  Vitals Value Taken Time  BP    Temp    Pulse    Resp    SpO2      Last Pain:  Vitals:   09/23/20 1149  TempSrc:   PainSc: 0-No pain         Complications: No complications documented.

## 2020-09-23 NOTE — Op Note (Signed)
[facility] Gastroenterology Patient Name: Rhonda Ingram Procedure Date: 09/23/2020 12:21 PM MRN: [id number] Account #: [id number] Date of Birth: November 06, 1926 Admit Type: Inpatient Age: 86 Room: [facility] ENDO ROOM 4 Gender: Female Note Status: Finalized Procedure:             Flexible Sigmoidoscopy Indications:           Melena Providers:             [name] MD, MD Medicines:             Monitored Anesthesia Care Complications:         No immediate complications. Procedure:             Pre-Anesthesia Assessment:                        - Prior to the procedure, a History and Physical was                         performed, and patient medications, allergies and                         sensitivities were reviewed. The patient's tolerance                         of previous anesthesia was reviewed.                        - The risks and benefits of the procedure and the                         sedation options and risks were discussed with the                         patient. All questions were answered and informed                         consent was obtained.                        - ASA Grade Assessment: III - A patient with severe                         systemic disease.                        After obtaining informed consent, the scope was passed                         under direct vision. The Endoscope was introduced                         through the anus and advanced to the the descending                         colon. The flexible sigmoidoscopy was accomplished                         with ease. The patient tolerated the procedure well.  The quality of the bowel preparation was fair. Findings:      The perianal and digital rectal examinations were normal.      Semi-solid black stool was found in the rectum, in the recto-sigmoid       colon and in the descending colon.      The exam was otherwise without abnormality.      Multiple  small and large-mouthed diverticula were found in the sigmoid       colon. Impression:            - Preparation of the colon was fair.                        - Stool in the rectum, in the recto-sigmoid colon and                         in the descending colon.                        - The examination was otherwise normal.                        - No specimens collected. Recommendation:        - Return patient to hospital ward for ongoing care.                        - Clear liquid diet.                        - Observe patient , Dr Servando Snare will see her tomorrow and                         can decide if she should go through a colonoscopy ,                         she does have diverticulosis of the left colon and                         could have had a diverticular bleed Procedure Code(s):     --- Professional ---                        [id number], Sigmoidoscopy, flexible; diagnostic, including                         collection of specimen(s) by brushing or washing, when                         performed (separate procedure) Diagnosis Code(s):     --- Professional ---                        K92.1, Melena (includes Hematochezia) CPT copyright 2019 American Medical Association. All rights reserved. The codes documented in this report are preliminary and upon coder review may  be revised to meet current compliance requirements. [name], MD [name] MD, MD 09/23/2020 12:40:39 PM This report has been signed electronically. Number of Addenda: 0 Note Initiated On: 09/23/2020 12:21 PM Estimated Blood Loss:  Estimated blood loss: none. Estimated blood loss: none.  [facility]

## 2020-09-23 NOTE — Anesthesia Procedure Notes (Signed)
Date/Time: 09/23/2020 12:36 PM Performed by: Junious Silk, CRNA Pre-anesthesia Checklist: Patient identified, Emergency Drugs available, Suction available, Patient being monitored and Timeout performed Oxygen Delivery Method: Nasal cannula

## 2020-09-23 NOTE — Progress Notes (Signed)
Patient ID: Rhonda Ingram, female   DOB: 05-21-1926, 86 y.o.   MRN: [id number] [facility] PROGRESS NOTE  Rhonda Ingram [facility] HWE:[id number] DOB: Jan 28, 1926 DOA: 09/21/2020 PCP: Aura Dials T, NP  HPI/Subjective: Patient overnight did have some small bowel movements with blood.  Some soreness in the abdomen this morning.  No nausea or vomiting.  Admitted with GI bleed requiring a transfusion.  Objective: Vitals:   09/23/20 1303 09/23/20 1313  BP: (!) 184/74 (!) 174/95  Pulse: 76 70  Resp: 20 15  Temp:    SpO2: 97% 96%    Intake/Output Summary (Last 24 hours) at 09/23/2020 1451 Last data filed at 09/23/2020 1300 Gross per 24 hour  Intake 1050 ml  Output --  Net 1050 ml   Filed Weights   09/21/20 1755 09/22/20 1545 09/23/20 0556  Weight: 51.7 kg 49.9 kg 51 kg    ROS: Review of Systems  Respiratory: Negative for shortness of breath.   Cardiovascular: Negative for chest pain.  Gastrointestinal: Negative for abdominal pain, nausea and vomiting.   Exam: Physical Exam HENT:     Head: Normocephalic.     Mouth/Throat:     Pharynx: No oropharyngeal exudate.  Eyes:     General: Lids are normal.     Conjunctiva/sclera: Conjunctivae normal.     Pupils: Pupils are equal, round, and reactive to light.  Cardiovascular:     Rate and Rhythm: Normal rate and regular rhythm.     Heart sounds: Normal heart sounds, S1 normal and S2 normal.  Pulmonary:     Breath sounds: No decreased breath sounds, wheezing, rhonchi or rales.  Abdominal:     Palpations: Abdomen is soft.     Tenderness: There is no abdominal tenderness.  Musculoskeletal:     Right lower leg: No swelling.     Left lower leg: No swelling.  Skin:    General: Skin is warm.     Findings: No rash.  Neurological:     Mental Status: She is alert and oriented to person, place, and time.       Data Reviewed: Basic Metabolic Panel: Recent Labs  Lab 09/21/20 1604 09/21/20 1802 09/22/20 0428  NA 139 136 140   K 4.6 4.2 3.9  CL 105 103 108  CO2 22 25 26   GLUCOSE 153* 152* 113*  BUN 36 40* 29*  CREATININE 0.93 0.81 0.73  CALCIUM 9.0 8.6* 8.5*   Liver Function Tests: Recent Labs  Lab 09/21/20 1604 09/21/20 1802  AST 16 19  ALT 6 10  ALKPHOS 67 55  BILITOT <0.2 0.3  PROT 6.5 6.9  ALBUMIN 3.4* 3.2*   CBC: Recent Labs  Lab 09/21/20 1602 09/21/20 1802 09/22/20 0428 09/23/20 0455  WBC 7.9 8.3 9.9 7.6  NEUTROABS 5.9  --   --   --   HGB 7.9* 8.0* 9.9* 9.9*  HCT 25.3* 25.3* 31.0* 31.2*  MCV 86 86.9 81.8 81.9  PLT 251 239 223 261     Recent Results (from the past 240 hour(s))  Respiratory Panel by RT PCR (Flu A&B, Covid) - Nasopharyngeal Swab     Status: None   Collection Time: 09/22/20  1:03 AM   Specimen: Nasopharyngeal Swab  Result Value Ref Range Status   SARS Coronavirus 2 by RT PCR NEGATIVE NEGATIVE Final    Comment: (NOTE) SARS-CoV-2 target nucleic acids are NOT DETECTED.  The SARS-CoV-2 RNA is generally detectable in upper respiratoy specimens during the acute phase of infection. The lowest concentration  of SARS-CoV-2 viral copies this assay can detect is 131 copies/mL. A negative result does not preclude SARS-Cov-2 infection and should not be used as the sole basis for treatment or other patient management decisions. A negative result may occur with  improper specimen collection/handling, submission of specimen other than nasopharyngeal swab, presence of viral mutation(s) within the areas targeted by this assay, and inadequate number of viral copies (<131 copies/mL). A negative result must be combined with clinical observations, patient history, and epidemiological information. The expected result is Negative.  Fact Sheet for Patients:  https://www.moore.com/  Fact Sheet for Healthcare Providers:  https://www.young.biz/  This test is no t yet approved or cleared by the [place] FDA and  has been authorized for  detection and/or diagnosis of SARS-CoV-2 by FDA under an Emergency Use Authorization (EUA). This EUA will remain  in effect (meaning this test can be used) for the duration of the COVID-19 declaration under Section 564(b)(1) of the Act, 21 U.S.C. section 360bbb-3(b)(1), unless the authorization is terminated or revoked sooner.     Influenza A by PCR NEGATIVE NEGATIVE Final   Influenza B by PCR NEGATIVE NEGATIVE Final    Comment: (NOTE) The Xpert Xpress SARS-CoV-2/FLU/RSV assay is intended as an aid in  the diagnosis of influenza from Nasopharyngeal swab specimens and  should not be used as a sole basis for treatment. Nasal washings and  aspirates are unacceptable for Xpert Xpress SARS-CoV-2/FLU/RSV  testing.  Fact Sheet for Patients: https://www.moore.com/  Fact Sheet for Healthcare Providers: https://www.young.biz/  This test is not yet approved or cleared by the [place] FDA and  has been authorized for detection and/or diagnosis of SARS-CoV-2 by  FDA under an Emergency Use Authorization (EUA). This EUA will remain  in effect (meaning this test can be used) for the duration of the  Covid-19 declaration under Section 564(b)(1) of the Act, 21  U.S.C. section 360bbb-3(b)(1), unless the authorization is  terminated or revoked. Performed at [facility], [address], [place], [place] [address]      Studies: CT ABDOMEN PELVIS W CONTRAST  Result Date: 09/21/2020 CLINICAL DATA:  Diverticulitis suspected. Epigastric pain. Rectal bleeding x2 days. EXAM: CT ABDOMEN AND PELVIS WITH CONTRAST TECHNIQUE: Multidetector CT imaging of the abdomen and pelvis was performed using the standard protocol following bolus administration of intravenous contrast. CONTRAST:  76mL OMNIPAQUE IOHEXOL 300 MG/ML  SOLN COMPARISON:  02/18/2020 FINDINGS: Lower chest: The lung bases are clear. The heart size is normal. Hepatobiliary: The liver is normal.  Status post cholecystectomy.There is mild intrahepatic and extrahepatic biliary ductal dilatation which is stable from prior study. Pancreas: Normal contours without ductal dilatation. No peripancreatic fluid collection. Spleen: Unremarkable. Adrenals/Urinary Tract: --Adrenal glands: Unremarkable. --Right kidney/ureter: No hydronephrosis or radiopaque kidney stones. --Left kidney/ureter: No hydronephrosis or radiopaque kidney stones. --Urinary bladder: Unremarkable. Stomach/Bowel: --Stomach/Duodenum: No hiatal hernia or other gastric abnormality. Normal duodenal course and caliber. --Small bowel: Unremarkable. --Colon: There is severe sigmoid diverticulosis. --Appendix: Normal. Vascular/Lymphatic: Atherosclerotic calcification is present within the non-aneurysmal abdominal aorta, without hemodynamically significant stenosis. --No retroperitoneal lymphadenopathy. --No mesenteric lymphadenopathy. --No pelvic or inguinal lymphadenopathy. Reproductive: Unremarkable Other: No ascites or free air. The abdominal wall is normal. Musculoskeletal. No acute displaced fractures. IMPRESSION: 1. No acute abdominopelvic abnormality. 2. Severe sigmoid diverticulosis without CT evidence for diverticulitis. Aortic Atherosclerosis (ICD10-I70.0). Electronically Signed   By: Katherine Mantle M.D.   On: 09/21/2020 23:26    Scheduled Meds: . benazepril  5 mg Oral Daily   Continuous  Infusions: . pantoprozole (PROTONIX) infusion Stopped (09/23/20 1347)    Assessment/Plan:  1. Acute blood loss anemia.  Patient had black stool with also bright red blood per rectum.  Patient was given 1 unit of packed red blood cells in the emergency room.  Hemoglobin stable at 9.9.  Patient still had some bleeding overnight.  Endoscopy and flexible sigmoidoscopy negative.  Continue to hold Mobic. Patient was given IV iron yesterday.  Gastroenterology wanted to watch again overnight and reevaluate on whether colonoscopy needed.  Could be a  diverticular bleed.  Clear liquid diet today just in case colonoscopy. 2. Essential hypertension.  Continue Lotensin. 3. Chronic back pain.  As needed Flexeril.  Hold Mobic. 4. Vitamin D deficiency can go back on vitamin D as outpatient 5. Patient did well with physical therapy     Code Status:     Code Status Orders  (From admission, onward)         Start     Ordered   09/21/20 2351  Full code  Continuous        09/21/20 2352        Code Status History    This patient has a current code status but no historical code status.   Advance Care Planning Activity     Family Communication: Spoke with daughter on the phone Disposition Plan: Status is: Observation  Dispo: The patient is from: Home              Anticipated d/c is to: Home              Anticipated d/c date is: Potential 09/24/2020              Patient being monitored for further signs of bleeding.  Gastroenterology to evaluate tomorrow to determine whether or not colonoscopy needed.  Consultants:  Gastroenterology  Procedures:  Endoscopy  Flexible sigmoidoscopy  Time spent: 27 minutes  [employer]to[employer]

## 2020-09-23 NOTE — Anesthesia Postprocedure Evaluation (Signed)
Anesthesia Post Note  Patient: Rhonda Ingram  Procedure(s) Performed: ESOPHAGOGASTRODUODENOSCOPY (EGD) WITH PROPOFOL (N/A ) FLEXIBLE SIGMOIDOSCOPY (N/A )  Patient location during evaluation: Endoscopy Anesthesia Type: General Level of consciousness: awake and alert and oriented Pain management: pain level controlled Vital Signs Assessment: post-procedure vital signs reviewed and stable Respiratory status: spontaneous breathing, nonlabored ventilation and respiratory function stable Cardiovascular status: blood pressure returned to baseline and stable Postop Assessment: no signs of nausea or vomiting Anesthetic complications: no   No complications documented.   Last Vitals:  Vitals:   09/23/20 1303 09/23/20 1313  BP: (!) 184/74 (!) 174/95  Pulse: 76 70  Resp: 20 15  Temp:    SpO2: 97% 96%    Last Pain:  Vitals:   09/23/20 1313  TempSrc:   PainSc: 0-No pain                 Lillianah Swartzentruber

## 2020-09-23 NOTE — OR Nursing (Signed)
Transport here to take pt back to her room via bed. 

## 2020-09-23 NOTE — H&P (Signed)
[name], MD [address], [place], [place], [address] [address], [address], [place], [place], [address] Phone: [phone]  Fax: [phone]  Primary Care Physician:  Marjie Skiff, NP   Pre-Procedure History & Physical: HPI:  BRISA AUTH is a 86 y.o. female is here for an endoscopy and flexible sigmoidoscopy   Past Medical History:  Diagnosis Date  . Arthritis    osteoarthritis  . Hyperlipidemia   . Hypertension   . Osteoporosis   . Vitamin D deficiency     Past Surgical History:  Procedure Laterality Date  . CHOLECYSTECTOMY    . EYE SURGERY     cataract extraction  . VENTRAL HERNIA REPAIR N/A 10/08/2016   Procedure: HERNIA REPAIR VENTRAL ADULT;  Surgeon: Kieth Brightly, MD;  Location: [facility] ORS;  Service: General;  Laterality: N/A;    Prior to Admission medications   Medication Sig Start Date End Date Taking? Authorizing Provider  benazepril (LOTENSIN) 5 MG tablet Take 1 tablet (5 mg total) by mouth daily. 08/03/20  Yes Johnson, Megan P, DO  cyclobenzaprine (FLEXERIL) 5 MG tablet Take 5 mg by mouth as needed. 06/02/20  Yes [provider]  fluticasone (FLONASE) 50 MCG/ACT nasal spray Place 1-2 sprays into both nostrils daily as needed (for allergies.).  08/16/16  Yes [provider]  meloxicam (MOBIC) 7.5 MG tablet Take 1 tablet (7.5 mg total) by mouth daily. 08/03/20  Yes Johnson, Megan P, DO  pantoprazole (PROTONIX) 40 MG tablet Take 1 tablet (40 mg total) by mouth daily. 08/03/20 08/03/21 Yes Johnson, Megan P, DO  triamcinolone cream (KENALOG) 0.1 % APPLY TWICE DAILY AS DIRECTED 03/13/19  Yes Crissman, Redge Gainer, MD  VITAMIN D PO Take by mouth daily.   Yes [provider]  diphenhydrAMINE (BENADRYL) 25 MG tablet Take 25 mg by mouth every 6 (six) hours as needed. Taking 1/2 tablet to 1 tablet prn at night for sleep Patient not taking: Reported on 09/21/2020    [provider]    Allergies as of  09/21/2020 - Review Complete 09/21/2020  Allergen Reaction Noted  . Alpha-d-galactosidase Itching and Shortness Of Breath 02/23/2020  . Meat extract Shortness Of Breath 10/08/2016  . Other  09/25/2016    Family History  Problem Relation Age of Onset  . Hypertension Mother   . Stroke Mother   . Cancer Father        lung  . Hypertension Sister   . Hyperlipidemia Sister   . Hypertension Daughter   . Cerebral palsy Daughter   . Hypertension Maternal Grandmother   . Hypertension Maternal Grandfather   . Hypertension Paternal Grandmother   . Hypertension Paternal Grandfather   . Hypertension Sister   . Hyperlipidemia Sister   . Migraines Daughter   . Diabetes Brother   . Hypertension Brother   . Hyperlipidemia Brother     Social History   Socioeconomic History  . Marital status: Divorced    Spouse name: Not on file  . Number of children: Not on file  . Years of education: Not on file  . Highest education level: 7th grade  Occupational History  . Occupation: retired   Tobacco Use  . Smoking status: Never Smoker  . Smokeless tobacco: Never Used  Vaping Use  . Vaping Use: Never used  Substance and Sexual Activity  . Alcohol use: No  . Drug use: No  . Sexual activity: Never  Other Topics Concern  . Not on  file  Social History Narrative  . Not on file   Social Determinants of Health   Financial Resource Strain:   . Difficulty of Paying Living Expenses: Not on file  Food Insecurity:   . Worried About Programme researcher, broadcasting/film/video in the Last Year: Not on file  . Ran Out of Food in the Last Year: Not on file  Transportation Needs:   . Lack of Transportation (Medical): Not on file  . Lack of Transportation (Non-Medical): Not on file  Physical Activity:   . Days of Exercise per Week: Not on file  . Minutes of Exercise per Session: Not on file  Stress:   . Feeling of Stress : Not on file  Social Connections:   . Frequency of Communication with Friends and Family: Not on  file  . Frequency of Social Gatherings with Friends and Family: Not on file  . Attends Religious Services: Not on file  . Active Member of Clubs or Organizations: Not on file  . Attends Banker Meetings: Not on file  . Marital Status: Not on file  Intimate Partner Violence:   . Fear of Current or Ex-Partner: Not on file  . Emotionally Abused: Not on file  . Physically Abused: Not on file  . Sexually Abused: Not on file    Review of Systems: See HPI, otherwise negative ROS  Physical Exam: BP 105/69   Pulse 73   Temp (!) 97.4 F (36.3 C)   Resp 17   Ht 5\' 3"  (1.6 m)   Wt 51 kg   LMP  (LMP Unknown)   SpO2 100%   BMI 19.93 kg/m  General:   Alert,  pleasant and cooperative in NAD Head:  Normocephalic and atraumatic. Neck:  Supple; no masses or thyromegaly. Lungs:  Clear throughout to auscultation, normal respiratory effort.    Heart:  +S1, +S2, Regular rate and rhythm, No edema. Abdomen:  Soft, nontender and nondistended. Normal bowel sounds, without guarding, and without rebound.   Neurologic:  Alert and  oriented x4;  grossly normal neurologically.  Impression/Plan: Aggarwal is here for an endoscopy and flexible sigmoidoscopy  to be performed for  evaluation of melena and bright red blood per rectum    Risks, benefits, limitations, and alternatives regarding endoscopy have been reviewed with the patient.  Questions have been answered.  All parties agreeable.   Jodelle Red, MD  09/23/2020, 12:02 PM

## 2020-09-23 NOTE — Op Note (Signed)
[facility] Gastroenterology Patient Name: Rhonda Ingram Procedure Date: 09/23/2020 12:22 PM MRN: [id number] Account #: [id number] Date of Birth: [birth year] Admit Type: Inpatient Age: 86 Room: [facility] ENDO ROOM 4 Gender: Female Note Status: Finalized Procedure:             Upper GI endoscopy Indications:           Melena Providers:             [name] MD, MD Referring MD:          No Local Md, MD (Referring MD) Medicines:             Monitored Anesthesia Care Complications:         No immediate complications. Procedure:             Pre-Anesthesia Assessment:                        - Prior to the procedure, a History and Physical was                         performed, and patient medications, allergies and                         sensitivities were reviewed. The patient's tolerance                         of previous anesthesia was reviewed.                        - The risks and benefits of the procedure and the                         sedation options and risks were discussed with the                         patient. All questions were answered and informed                         consent was obtained.                        - ASA Grade Assessment: III - A patient with severe                         systemic disease.                        After obtaining informed consent, the endoscope was                         passed under direct vision. Throughout the procedure,                         the patient's blood pressure, pulse, and oxygen                         saturations were monitored continuously. The Endoscope                         was introduced through the mouth, and advanced  to the                         third part of duodenum. The upper GI endoscopy was                         accomplished with ease. The patient tolerated the                         procedure well. Findings:      The esophagus was normal.      The stomach was normal.      The  examined duodenum was normal. Impression:            - Normal esophagus.                        - Normal stomach.                        - Normal examined duodenum.                        - No specimens collected. Recommendation:        - Perform a flexible sigmoidoscopy for further                         evaluation of the rectum and sigmoid colon today. Procedure Code(s):     --- Professional ---                        [phone], Esophagogastroduodenoscopy, flexible,                         transoral; diagnostic, including collection of                         specimen(s) by brushing or washing, when performed                         (separate procedure) Diagnosis Code(s):     --- Professional ---                        K92.1, Melena (includes Hematochezia) CPT copyright 2019 American Medical Association. All rights reserved. The codes documented in this report are preliminary and upon coder review may  be revised to meet current compliance requirements. [name], MD [name] MD, MD 09/23/2020 12:32:58 PM This report has been signed electronically. Number of Addenda: 0 Note Initiated On: 09/23/2020 12:22 PM Estimated Blood Loss:  Estimated blood loss: none.      [facility]

## 2020-09-24 DIAGNOSIS — E559 Vitamin D deficiency, unspecified: Secondary | ICD-10-CM | POA: Diagnosis not present

## 2020-09-24 DIAGNOSIS — M549 Dorsalgia, unspecified: Secondary | ICD-10-CM | POA: Diagnosis not present

## 2020-09-24 DIAGNOSIS — G8929 Other chronic pain: Secondary | ICD-10-CM | POA: Diagnosis not present

## 2020-09-24 DIAGNOSIS — I10 Essential (primary) hypertension: Secondary | ICD-10-CM | POA: Diagnosis not present

## 2020-09-24 DIAGNOSIS — K921 Melena: Secondary | ICD-10-CM | POA: Diagnosis not present

## 2020-09-24 DIAGNOSIS — D62 Acute posthemorrhagic anemia: Secondary | ICD-10-CM | POA: Diagnosis not present

## 2020-09-24 DIAGNOSIS — K625 Hemorrhage of anus and rectum: Secondary | ICD-10-CM | POA: Diagnosis not present

## 2020-09-24 LAB — CBC
HCT: 28.2 % — ABNORMAL LOW (ref 36.0–46.0)
Hemoglobin: 8.9 g/dL — ABNORMAL LOW (ref 12.0–15.0)
MCH: 25.9 pg — ABNORMAL LOW (ref 26.0–34.0)
MCHC: 31.6 g/dL (ref 30.0–36.0)
MCV: 82.2 fL (ref 80.0–100.0)
Platelets: 260 10*3/uL (ref 150–400)
RBC: 3.43 MIL/uL — ABNORMAL LOW (ref 3.87–5.11)
RDW: 16 % — ABNORMAL HIGH (ref 11.5–15.5)
WBC: 7.1 10*3/uL (ref 4.0–10.5)
nRBC: 0 % (ref 0.0–0.2)

## 2020-09-24 MED ORDER — SODIUM CHLORIDE 0.9 % IV SOLN
200.0000 mg | Freq: Once | INTRAVENOUS | Status: AC
  Administered 2020-09-24: 200 mg via INTRAVENOUS
  Filled 2020-09-24: qty 10

## 2020-09-24 NOTE — Plan of Care (Signed)

## 2020-09-24 NOTE — Discharge Summary (Signed)
[facility] [place] [facility]   PATIENT NAME: Rhonda Ingram    MR#:  [id number]  DATE OF BIRTH:  10/04/1926  DATE OF ADMISSION:  09/21/2020 ADMITTING PHYSICIAN: Andris BaumannHazel Duncan V, MD  DATE OF DISCHARGE: 09/24/2020  3:18 PM  PRIMARY CARE PHYSICIAN: Aura Dialsannady, Jolene T, NP    ADMISSION DIAGNOSIS:  Melena [K92.1] Gastrointestinal hemorrhage, unspecified gastrointestinal hemorrhage type [K92.2]  DISCHARGE DIAGNOSIS:  Principal Problem:   Acute blood loss anemia Active Problems:   Essential hypertension   Hyperlipidemia   Bright red blood per rectum   Melena   Hematochezia   Diverticulosis   NSAID long-term use   SECONDARY DIAGNOSIS:   Past Medical History:  Diagnosis Date   Arthritis    osteoarthritis   Hyperlipidemia    Hypertension    Osteoporosis    Vitamin D deficiency     HOSPITAL COURSE:   1.  Acute blood loss anemia.  The patient did have black stool with also bright red blood per rectum.  The patient did receive 1 unit of packed red blood cells from the emergency room.  The patient did have an endoscopy and a flexible sigmoidoscopy that did not show any signs of active bleeding.  Continue to hold Mobic.  I did give the patient 2 doses of IV iron during the hospital course.  The patient was seen by gastroenterology on the day of discharge and recommended outpatient follow-up with Dr. Norma Fredricksonoledo.  I also recommended following up with hematology for future iron infusions.  The patient came in with a hemoglobin of 7.9.  After transfusion blood counts came up to 9.9 but settled at 8.9.  Upon disposition the patient did not have any more bleeding. Likely a diverticular bleed. 2.  Essential hypertension.  Continue Lotensin. 3.  Chronic back pain.  As needed Flexeril.  Hold Mobic. 4.  Vitamin D deficiency can go back on vitamin D as outpatient 5.  Patient did well with physical therapy and no needs at home.  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   cyclobenzaprine 5 MG tablet Commonly known as: FLEXERIL Take 5 mg by mouth as needed.   fluticasone 50 MCG/ACT nasal spray Commonly known as: FLONASE Place 1-2 sprays into both nostrils daily as needed (for allergies.).   pantoprazole 40 MG tablet Commonly known as: Protonix Take 1 tablet (40 mg total) by mouth daily.   triamcinolone cream 0.1 % Commonly known as: KENALOG APPLY TWICE DAILY AS DIRECTED   VITAMIN D PO Take by mouth daily.        DISCHARGE INSTRUCTIONS:   Follow-up 5  days PMD Follow-up 2 weeks gastroenterology Follow-up hematology as outpatient  If you experience worsening of your admission symptoms, develop shortness of breath, life threatening emergency, suicidal or homicidal thoughts you must seek medical attention immediately by calling 911 or calling your MD immediately  if symptoms less severe.  You Must read complete instructions/literature along with all the possible adverse reactions/side effects for all the Medicines you take and that have been prescribed to you. Take any new Medicines after you have completely  understood and accept all the possible adverse reactions/side effects.   Please note  You were cared for by a hospitalist during your hospital stay. If you have any questions about your discharge medications or the care you received while you were in the hospital after you are discharged, you can call the unit and asked to speak with the hospitalist on call if the hospitalist that took care of you is not available. Once you are discharged, your primary care physician will handle any further medical issues. Please note that NO REFILLS for any discharge medications will be authorized once you are discharged, as it is imperative that you return to your primary care physician (or establish a relationship with a primary care physician if you do not have one) for your aftercare needs so that they can reassess your need for medications and monitor your lab values.    Today   CHIEF COMPLAINT:   Chief Complaint  Patient presents with   Rectal Bleeding    HISTORY OF PRESENT ILLNESS:  Rhonda Ingram  is a 86 y.o. female came in with rectal bleeding.   VITAL SIGNS:  Blood pressure (!) 152/83, pulse 95, temperature 98.6 F (37 C), temperature source Oral, resp. rate 15, height 5\' 3"  (1.6 m), weight 51.4 kg, SpO2 92 %.  I/O:    Intake/Output Summary (Last 24 hours) at 09/24/2020 1538 Last data filed at 09/24/2020 1023 Gross per 24 hour  Intake 600 ml  Output --  Net 600 ml    PHYSICAL EXAMINATION:  GENERAL:  86 y.o.-year-old patient lying in the bed with no acute distress.  EYES: Pupils equal, round, reactive to light and accommodation. No scleral icterus. Extraocular muscles intact.  HEENT: Head atraumatic, normocephalic. Oropharynx and nasopharynx clear.   LUNGS: Normal breath sounds bilaterally, no wheezing, rales,rhonchi or crepitation. No use of accessory muscles of respiration.  CARDIOVASCULAR: S1, S2 normal. No murmurs, rubs, or gallops.  ABDOMEN: Soft, non-tender, non-distended.   EXTREMITIES: No pedal edema.  NEUROLOGIC: Cranial nerves II through XII are intact. Muscle strength 5/5 in all extremities. Sensation intact. Gait not checked.  PSYCHIATRIC: The patient is alert and oriented x 3.  SKIN: No obvious rash, lesion, or ulcer.   DATA REVIEW:   CBC Recent Labs  Lab 09/24/20 0702  WBC 7.1  HGB 8.9*  HCT 28.2*  PLT 260    Chemistries  Recent Labs  Lab 09/21/20 1802 09/21/20 1802 09/22/20 0428  NA 136   < > 140  K 4.2   < > 3.9  CL 103   < > 108  CO2 25   < > 26  GLUCOSE 152*   < > 113*  BUN 40*   < > 29*  CREATININE 0.81   < > 0.73  CALCIUM 8.6*   < > 8.5*  AST 19  --   --   ALT 10  --   --   ALKPHOS 55  --   --   BILITOT 0.3  --   --    < > =  values in this interval not displayed.    Microbiology Results  Results for orders placed or performed during the hospital encounter of 09/21/20  Respiratory Panel by RT PCR (Flu A&B, Covid) - Nasopharyngeal Swab     Status: None   Collection Time: 09/22/20  1:03 AM   Specimen: Nasopharyngeal Swab  Result Value Ref Range Status   SARS Coronavirus 2 by RT PCR NEGATIVE NEGATIVE Final    Comment: (NOTE) SARS-CoV-2 target nucleic acids are NOT DETECTED.  The SARS-CoV-2 RNA is generally detectable in upper respiratoy specimens during the acute phase of infection. The lowest concentration of SARS-CoV-2 viral copies this assay can detect is 131 copies/mL. A negative result does not preclude SARS-Cov-2 infection and should not be used as the sole basis for treatment or other patient management decisions. A negative result may occur with  improper specimen collection/handling, submission of specimen other than nasopharyngeal swab, presence of viral mutation(s) within the areas targeted by this assay, and inadequate number of viral copies (<131 copies/mL). A negative result must be combined with clinical observations, patient history, and epidemiological information. The expected result is  Negative.  Fact Sheet for Patients:  https://www.moore.com/  Fact Sheet for Healthcare Providers:  https://www.young.biz/  This test is no t yet approved or cleared by the [place] FDA and  has been authorized for detection and/or diagnosis of SARS-CoV-2 by FDA under an Emergency Use Authorization (EUA). This EUA will remain  in effect (meaning this test can be used) for the duration of the COVID-19 declaration under Section 564(b)(1) of the Act, 21 U.S.C. section 360bbb-3(b)(1), unless the authorization is terminated or revoked sooner.     Influenza A by PCR NEGATIVE NEGATIVE Final   Influenza B by PCR NEGATIVE NEGATIVE Final    Comment: (NOTE) The Xpert Xpress SARS-CoV-2/FLU/RSV assay is intended as an aid in  the diagnosis of influenza from Nasopharyngeal swab specimens and  should not be used as a sole basis for treatment. Nasal washings and  aspirates are unacceptable for Xpert Xpress SARS-CoV-2/FLU/RSV  testing.  Fact Sheet for Patients: https://www.moore.com/  Fact Sheet for Healthcare Providers: https://www.young.biz/  This test is not yet approved or cleared by the [place] FDA and  has been authorized for detection and/or diagnosis of SARS-CoV-2 by  FDA under an Emergency Use Authorization (EUA). This EUA will remain  in effect (meaning this test can be used) for the duration of the  Covid-19 declaration under Section 564(b)(1) of the Act, 21  U.S.C. section 360bbb-3(b)(1), unless the authorization is  terminated or revoked. Performed at [facility], [address] [place], [place] [address]     Management plans discussed with the patient, and she is in agreement.  I spoke with the patient's daughter yesterday and left message for her today.  CODE STATUS:     Code Status Orders  (From admission, onward)         Start     Ordered   09/21/20 2351  Full code   Continuous        09/21/20 2352        Code Status History    This patient has a current code status but no historical code status.   Advance Care Planning Activity      TOTAL TIME TAKING CARE OF THIS PATIENT: 35 minutes.    Alford Highland M.D on 09/24/2020 at 3:38 PM  Between 7am to 6pm - Pager - [phone]  After 6pm go to [email or web address] - password  [email or web address]  [facility]  CC: Primary care physician; Marjie Skiff, NP

## 2020-09-24 NOTE — Discharge Instructions (Signed)
Stop mobic  Lower Gastrointestinal Bleeding  Lower gastrointestinal (GI) bleeding is the result of bleeding from the colon, rectum, or anal area. The colon is the last part of the digestive tract, where stool, also called feces, is formed. If you have lower GI bleeding, you may see blood in or on your stool. It may be bright red. Lower GI bleeding often stops without treatment. Continued or heavy bleeding needs emergency treatment at the hospital. What are the causes? Lower GI bleeding may be caused by:  A condition that causes pouches to form in the colon over time (diverticulosis).  Swelling and irritation (inflammation) in areas with diverticulosis (diverticulitis).  Inflammation of the colon (inflammatory bowel disease).  Swollen veins in the rectum (hemorrhoids).  Painful tears in the anus (anal fissures), often caused by passing hard stools.  Cancer of the colon or rectum.  Noncancerous growths (polyps) of the colon or rectum.  A bleeding disorder that impairs the formation of blood clots and causes easy bleeding (coagulopathy).  An abnormal weakening of a blood vessel where an artery and a vein come together (arteriovenous malformation). What increases the risk? You are more likely to develop this condition if:  You are older than 84 years of age.  You take aspirin or NSAIDs on a regular basis.  You take anticoagulant or antiplatelet drugs.  You have a history of high-dose X-ray treatment (radiation therapy) of the colon.  You recently had a colon polyp removed. What are the signs or symptoms? Symptoms of this condition include:  Bright red blood or blood clots coming from your rectum.  Bloody stools.  Black or maroon-colored stools.  Pain or cramping in the abdomen.  Weakness or dizziness.  Racing heartbeat. How is this diagnosed? This condition may be diagnosed based on:  Your symptoms and medical history.  A physical exam. During the exam, your  health care provider will check for signs of blood loss, such as low blood pressure and a rapid pulse.  Tests, such as: ? Flexible sigmoidoscopy. In this procedure, a flexible tube with a camera on the end is used to examine your anus and the first part of your colon to look for the source of bleeding. ? Colonoscopy. This is similar to a flexible sigmoidoscopy, but the camera can extend all the way to the uppermost part of your colon. ? Blood tests to measure your red blood cell count and to check for coagulopathy. ? An imaging study of your colon to look for a bleeding site. In some cases, you may have X-rays taken after a dye or radioactive substance is injected into your bloodstream (angiogram). How is this treated? Treatment for this condition depends on the cause of the bleeding. Heavy or persistent bleeding is treated at the hospital. Treatment may include:  Getting fluids through an IV tube inserted into one of your veins.  Getting blood through an IV tube (blood transfusion).  Stopping bleeding through high-heat coagulation, injections of certain medicines, or applying surgical clips. This can all be done during a colonoscopy.  Having a procedure that involves first doing an angiogram and then blocking blood flow to the bleeding site (embolization).  Stopping some of your regular medicines for a certain amount of time.  Having surgery to remove part of the colon. This may be needed if bleeding is severe and does not respond to other treatment. Follow these instructions at home:  Take over-the-counter and prescription medicines only as told by your health care provider. You  may need to avoid aspirin, NSAIDs, or other medicines that increase bleeding.  Eat foods that are high in fiber. This will help keep your stools soft. These foods include whole grains, legumes, fruits, and vegetables. Eating 1-3 prunes each day works well for many people.  Drink enough fluid to keep your urine  clear or pale yellow.  Keep all follow-up visits as told by your health care provider. This is important. Contact a health care provider if:  Your symptoms do not improve. Get help right away if:  Your bleeding increases.  You feel light-headed or you faint.  You feel weak.  You have severe cramps in your back or abdomen.  You pass large blood clots in your stool.  Your symptoms get worse. This information is not intended to replace advice given to you by your health care provider. Make sure you discuss any questions you have with your health care provider. Document Revised: 02/20/2019 Document Reviewed: 03/15/2016 Elsevier Patient Education  2020 [employer].

## 2020-09-24 NOTE — Progress Notes (Signed)
Rhonda Minium, MD [facility]   [address], [address] [place], [place] [address] Phone: [phone] Fax : [phone]   Subjective: The patient has had no further sign of bleeding.  The patient is doing well and states that she would like to go home.  There is no report of any abdominal pain fevers chills nausea or vomiting.   Objective: Vital signs in last 24 hours: Vitals:   09/23/20 1511 09/23/20 2008 09/24/20 0400 09/24/20 0744  BP: 126/69 140/72 (!) 157/76 132/62  Pulse: 80 74 69 65  Resp: 17 19 15    Temp: 98.2 F (36.8 C) 98.5 F (36.9 C) 98.8 F (37.1 C) 99.2 F (37.3 C)  TempSrc: Oral Oral Oral Oral  SpO2: 97% 97% 95% 94%  Weight:   51.4 kg   Height:       Weight change: 1.469 kg  Intake/Output Summary (Last 24 hours) at 09/24/2020 1115 Last data filed at 09/24/2020 1023 Gross per 24 hour  Intake 1020 ml  Output --  Net 1020 ml     Exam: Heart:: Regular rate and rhythm, S1S2 present or without murmur or extra heart sounds Lungs: normal and clear to auscultation and percussion Abdomen: soft, nontender, normal bowel sounds   Lab Results: @LABTEST2 @ Micro Results: Recent Results (from the past 240 hour(s))  Respiratory Panel by RT PCR (Flu A&B, Covid) - Nasopharyngeal Swab     Status: None   Collection Time: 09/22/20  1:03 AM   Specimen: Nasopharyngeal Swab  Result Value Ref Range Status   SARS Coronavirus 2 by RT PCR NEGATIVE NEGATIVE Final    Comment: (NOTE) SARS-CoV-2 target nucleic acids are NOT DETECTED.  The SARS-CoV-2 RNA is generally detectable in upper respiratoy specimens during the acute phase of infection. The lowest concentration of SARS-CoV-2 viral copies this assay can detect is 131 copies/mL. A negative result does not preclude SARS-Cov-2 infection and should not be used as the sole basis for treatment or other patient management decisions. A negative result may occur with  improper specimen collection/handling, submission of specimen  other than nasopharyngeal swab, presence of viral mutation(s) within the areas targeted by this assay, and inadequate number of viral copies (<131 copies/mL). A negative result must be combined with clinical observations, patient history, and epidemiological information. The expected result is Negative.  Fact Sheet for Patients:   Fact Sheet for Healthcare Providers:  13/11/21  This test is no t yet approved or cleared by the https://www.moore.com/ FDA and  has been authorized for detection and/or diagnosis of SARS-CoV-2 by FDA under an Emergency Use Authorization (EUA). This EUA will remain  in effect (meaning this test can be used) for the duration of the COVID-19 declaration under Section 564(b)(1) of the Act, 21 U.S.C. section 360bbb-3(b)(1), unless the authorization is terminated or revoked sooner.     Influenza A by PCR NEGATIVE NEGATIVE Final   Influenza B by PCR NEGATIVE NEGATIVE Final    Comment: (NOTE) The Xpert Xpress SARS-CoV-2/FLU/RSV assay is intended as an aid in  the diagnosis of influenza from Nasopharyngeal swab specimens and  should not be used as a sole basis for treatment. Nasal washings and  aspirates are unacceptable for Xpert Xpress SARS-CoV-2/FLU/RSV  testing.  Fact Sheet for Patients: https://www.young.biz/  Fact Sheet for Healthcare Providers: [place]  This test is not yet approved or cleared by the https://www.moore.com/ FDA and  has been authorized for detection and/or diagnosis of SARS-CoV-2 by  FDA under an Emergency Use Authorization (EUA).  This EUA will remain  in effect (meaning this test can be used) for the duration of the  Covid-19 declaration under Section 564(b)(1) of the Act, 21  U.S.C. section 360bbb-3(b)(1), unless the authorization is  terminated or revoked. Performed at [facility], [address]  [place], [place] [address]    Studies/Results: No results found. Medications: I have reviewed the patient's current medications. Scheduled Meds: . benazepril  5 mg Oral Daily  . pantoprazole  40 mg Oral Daily   Continuous Infusions: . iron sucrose     PRN Meds:.cyclobenzaprine, metoprolol tartrate, morphine injection, promethazine   Assessment: Principal Problem:   Acute blood loss anemia Active Problems:   Essential hypertension   Hyperlipidemia   Bright red blood per rectum   Melena   Hematochezia   Diverticulosis   NSAID long-term use    Plan: This patient was seen today with a history of having a GI bleed.  The patient has had no further symptoms and reports that she is not having any black stools bloody stools or abdominal pain.  The patient has been told to follow-up with her primary gastroenterologist Dr. Norma Fredrickson or her primary care provider soon as possible after discharge for checking her hemoglobin.  The patient will be determined whether she needs a full colonoscopy by her primary gastroenterologist.  The patient has been explained the plan and agrees with it.   LOS: 0 days   Sherlyn Hay 09/24/2020, 11:15 AM Pager [phone] 7am-5pm  Check AMION for 5pm -7am coverage and on weekends

## 2020-09-26 ENCOUNTER — Encounter: Payer: Self-pay | Admitting: Gastroenterology

## 2020-09-28 ENCOUNTER — Encounter: Payer: Self-pay | Admitting: Nurse Practitioner

## 2020-09-28 ENCOUNTER — Ambulatory Visit (INDEPENDENT_AMBULATORY_CARE_PROVIDER_SITE_OTHER): Payer: Medicare HMO | Admitting: Nurse Practitioner

## 2020-09-28 ENCOUNTER — Other Ambulatory Visit: Payer: Self-pay

## 2020-09-28 VITALS — BP 136/80 | HR 79 | Temp 98.4°F | Wt 113.8 lb

## 2020-09-28 DIAGNOSIS — D62 Acute posthemorrhagic anemia: Secondary | ICD-10-CM | POA: Diagnosis not present

## 2020-09-28 DIAGNOSIS — Z791 Long term (current) use of non-steroidal anti-inflammatories (NSAID): Secondary | ICD-10-CM | POA: Diagnosis not present

## 2020-09-28 DIAGNOSIS — K5790 Diverticulosis of intestine, part unspecified, without perforation or abscess without bleeding: Secondary | ICD-10-CM

## 2020-09-28 DIAGNOSIS — I809 Phlebitis and thrombophlebitis of unspecified site: Secondary | ICD-10-CM | POA: Diagnosis not present

## 2020-09-28 DIAGNOSIS — I10 Essential (primary) hypertension: Secondary | ICD-10-CM

## 2020-09-28 DIAGNOSIS — D519 Vitamin B12 deficiency anemia, unspecified: Secondary | ICD-10-CM | POA: Diagnosis not present

## 2020-09-28 DIAGNOSIS — T801XXA Vascular complications following infusion, transfusion and therapeutic injection, initial encounter: Secondary | ICD-10-CM | POA: Insufficient documentation

## 2020-09-28 LAB — CBC WITH DIFFERENTIAL/PLATELET
Hematocrit: 30.9 % — ABNORMAL LOW (ref 34.0–46.6)
Hemoglobin: 9.6 g/dL — ABNORMAL LOW (ref 11.1–15.9)
Lymphocytes Absolute: 1.4 10*3/uL (ref 0.7–3.1)
Lymphs: 19 %
MCH: 26.7 pg (ref 26.6–33.0)
MCHC: 31.1 g/dL — ABNORMAL LOW (ref 31.5–35.7)
MCV: 86 fL (ref 79–97)
MID (Absolute): 1.2 10*3/uL (ref 0.1–1.6)
MID: 15 %
Neutrophils Absolute: 4.8 10*3/uL (ref 1.4–7.0)
Neutrophils: 66 %
Platelets: 385 10*3/uL (ref 150–450)
RBC: 3.59 x10E6/uL — ABNORMAL LOW (ref 3.77–5.28)
RDW: 16.7 % — ABNORMAL HIGH (ref 11.7–15.4)
WBC: 7.4 10*3/uL (ref 3.4–10.8)

## 2020-09-28 MED ORDER — AMOXICILLIN-POT CLAVULANATE 875-125 MG PO TABS: 1.0000 | ORAL_TABLET | Freq: Two times a day (BID) | ORAL | 0 refills | Status: AC

## 2020-09-28 NOTE — Progress Notes (Signed)
BP 136/80   Pulse 79   Temp 98.4 F (36.9 C)   Wt 113 lb 12.8 oz (51.6 kg)   LMP  (LMP Unknown)   SpO2 96%   BMI 20.16 kg/m    Subjective:    Patient ID: Rhonda Ingram, female    DOB: 09/27/1926, 86 y.o.   MRN: [id number]  HPI: Rhonda ProudMary M Ringstad is a 86 y.o. female  Chief Complaint  Patient presents with  . Hospitalization Follow-up    pt was admitted into hospital 11/11 for GI bleeding was discharged on 11/13   Transition of Care Hospital Follow up.  Admitted on 09/21/2020 for GI bleed, suspected to diverticular bleed.  Is to follow with GI provider, Dr. Norma Fredricksonoledo, outpatient.  She is to have full colonoscopy.  CT in hospital noted severe sigmoid diverticulosis without evidence for diverticulitis + aortic atherosclerosis.  Had flexible sigmoidoscopy and endoscopy -- with no evidence of bleed.  She is to follow-up with hematology in 3 weeks.  Her Mobic was discontinued.  [facility] and she reports this is causing discomfort when she moves her arm now.    "1.  Acute blood loss anemia.  The patient did have black stool with also bright red blood per rectum.  The patient did receive 1 unit of packed red blood cells from the emergency room.  The patient did have an endoscopy and a flexible sigmoidoscopy that did not show any signs of active bleeding.  Continue to hold Mobic.  I did give the patient 2 doses of IV iron during the hospital course. The patient was seen by gastroenterology on the day of discharge and recommended outpatient follow-up with Dr. Norma Fredricksonoledo.  I also recommended following up with hematology for future iron infusions.  The patient came in with a hemoglobin of 7.9.  After transfusion blood counts came up to 9.9 but settled at 8.9.  Upon disposition the patient did not have any more bleeding. Likely a diverticular bleed. 2.  Essential hypertension.  Continue Lotensin. 3.  Chronic back pain.  As needed Flexeril.  Hold Mobic. 4.  Vitamin D deficiency can go back  on vitamin D as outpatient 5.  Patient did well with physical therapy and no needs at home."  [facility] Physician: Dr. Renae GlossWieting D/C Date: 09/24/2020  Records Requested: 09/28/2020 Records Received: 09/28/2020 Records Reviewed: 09/28/2020  Diagnoses on Discharge:  Melena [K92.1] Gastrointestinal hemorrhage, unspecified gastrointestinal hemorrhage type [K92.2]  Date of interactive Contact within 48 hours of discharge:  Contact was through: phone  Date of 7 day or 14 day face-to-face visit:    within 7 days  Outpatient Encounter Medications as of 09/28/2020  Medication Sig  . benazepril (LOTENSIN) 5 MG tablet Take 1 tablet (5 mg total) by mouth daily.  . cyclobenzaprine (FLEXERIL) 5 MG tablet Take 5 mg by mouth as needed.  . fluticasone (FLONASE) 50 MCG/ACT nasal spray Place 1-2 sprays into both nostrils daily as needed (for allergies.).   [name]. pantoprazole (PROTONIX) 40 MG tablet Take 1 tablet (40 mg total) by mouth daily.  [name]. triamcinolone cream (KENALOG) 0.1 % APPLY TWICE DAILY AS DIRECTED  . VITAMIN D PO Take by mouth daily.  [name]. amoxicillin-clavulanate (AUGMENTIN) 875-125 MG tablet Take 1 tablet by mouth 2 (two) times daily for 7 days.   No facility-administered encounter medications on file as of 09/28/2020.    Diagnostic Tests Reviewed/Disposition: reviewed on chart -- CBC when left hospital noted H/H 8.9/28.2, MCV  82.2, B12 323, ferritin 13, iron 153  Consults: GI  Discharge Instructions: To follow-up with GI, PCP, and hematology  Disease/illness Education: discussed with patient todya  Home Health/Community Services Discussions/Referrals: none  Establishment or re-establishment of referral orders for community resources: none  Discussion with other health care providers: reviewed notes  Assessment and Support of treatment regimen adherence: reviewed with patient  Appointments Coordinated with: reviewed with patient and referrals placed  Education  for self-management, independent living, and ADLs: reviewed with patient  Relevant past medical, surgical, family and social history reviewed and updated as indicated. Interim medical history since our last visit reviewed. Allergies and medications reviewed and updated.  Review of Systems  Constitutional: Negative for activity change, appetite change, diaphoresis, fatigue and fever.  Respiratory: Negative for cough, chest tightness, shortness of breath and wheezing.   Cardiovascular: Negative for chest pain, palpitations and leg swelling.  Gastrointestinal: Negative for abdominal distention, abdominal pain, blood in stool, constipation, diarrhea, nausea, rectal pain and vomiting.  Musculoskeletal: Positive for arthralgias.  Neurological: Negative for dizziness, syncope, weakness, light-headedness, numbness and headaches.  Psychiatric/Behavioral: Negative.     Per HPI unless specifically indicated above     Objective:    BP 136/80   Pulse 79   Temp 98.4 F (36.9 C)   Wt 113 lb 12.8 oz (51.6 kg)   LMP  (LMP Unknown)   SpO2 96%   BMI 20.16 kg/m   Wt Readings from Last 3 Encounters:  09/28/20 113 lb 12.8 oz (51.6 kg)  09/24/20 113 lb 5.4 oz (51.4 kg)  09/21/20 113 lb 3.2 oz (51.3 kg)    Physical Exam Vitals and nursing note reviewed.  Constitutional:      General: She is awake. She is not in acute distress.    Appearance: She is well-developed and well-groomed. She is not ill-appearing or toxic-appearing.  HENT:     Head: Normocephalic.     Right Ear: Hearing normal.     Left Ear: Hearing normal.  Eyes:     General: Lids are normal.        Right eye: No discharge.        Left eye: No discharge.     Conjunctiva/sclera: Conjunctivae normal.     Pupils: Pupils are equal, round, and reactive to light.  Neck:     Thyroid: No thyromegaly.     Vascular: No carotid bruit.  Cardiovascular:     Rate and Rhythm: Normal rate and regular rhythm.     Pulses:          Radial  pulses are 2+ on the right side and 2+ on the left side.     Heart sounds: Normal heart sounds. No murmur heard.  No gallop.   Pulmonary:     Effort: Pulmonary effort is normal. No accessory muscle usage or respiratory distress.     Breath sounds: Normal breath sounds.  Abdominal:     General: Bowel sounds are normal. There is no distension.     Palpations: Abdomen is soft. There is no hepatomegaly or splenomegaly.     Tenderness: There is no abdominal tenderness. There is no right CVA tenderness, left CVA tenderness, guarding or rebound.  Musculoskeletal:     Cervical back: Normal range of motion and neck supple.     Right lower leg: No edema.     Left lower leg: No edema.  Skin:    General: Skin is warm and dry.       Neurological:  Mental Status: She is alert and oriented to person, place, and time.  Psychiatric:        Attention and Perception: Attention normal.        Mood and Affect: Mood normal.        Speech: Speech normal.        Behavior: Behavior normal. Behavior is cooperative.        Thought Content: Thought content normal.    Results for orders placed or performed during the hospital encounter of 09/21/20  Respiratory Panel by RT PCR (Flu A&B, Covid) - Nasopharyngeal Swab   Specimen: Nasopharyngeal Swab  Result Value Ref Range   SARS Coronavirus 2 by RT PCR NEGATIVE NEGATIVE   Influenza A by PCR NEGATIVE NEGATIVE   Influenza B by PCR NEGATIVE NEGATIVE  Comprehensive metabolic panel  Result Value Ref Range   Sodium 136 135 - 145 mmol/L   Potassium 4.2 3.5 - 5.1 mmol/L   Chloride 103 98 - 111 mmol/L   CO2 25 22 - 32 mmol/L   Glucose, Bld 152 (H) 70 - 99 mg/dL   BUN 40 (H) 8 - 23 mg/dL   Creatinine, Ser 1.19 0.44 - 1.00 mg/dL   Calcium 8.6 (L) 8.9 - 10.3 mg/dL   Total Protein 6.9 6.5 - 8.1 g/dL   Albumin 3.2 (L) 3.5 - 5.0 g/dL   AST 19 15 - 41 U/L   ALT 10 0 - 44 U/L   Alkaline Phosphatase 55 38 - 126 U/L   Total Bilirubin 0.3 0.3 - 1.2 mg/dL   GFR,  Estimated >41 >74 mL/min   Anion gap 8 5 - 15  CBC  Result Value Ref Range   WBC 8.3 4.0 - 10.5 K/uL   RBC 2.91 (L) 3.87 - 5.11 MIL/uL   Hemoglobin 8.0 (L) 12.0 - 15.0 g/dL   HCT 08.1 (L) 36 - 46 %   MCV 86.9 80.0 - 100.0 fL   MCH 27.5 26.0 - 34.0 pg   MCHC 31.6 30.0 - 36.0 g/dL   RDW 44.8 18.5 - 63.1 %   Platelets 239 150 - 400 K/uL   nRBC 0.0 0.0 - 0.2 %  Basic metabolic panel  Result Value Ref Range   Sodium 140 135 - 145 mmol/L   Potassium 3.9 3.5 - 5.1 mmol/L   Chloride 108 98 - 111 mmol/L   CO2 26 22 - 32 mmol/L   Glucose, Bld 113 (H) 70 - 99 mg/dL   BUN 29 (H) 8 - 23 mg/dL   Creatinine, Ser 4.97 0.44 - 1.00 mg/dL   Calcium 8.5 (L) 8.9 - 10.3 mg/dL   GFR, Estimated >02 >63 mL/min   Anion gap 6 5 - 15  CBC  Result Value Ref Range   WBC 9.9 4.0 - 10.5 K/uL   RBC 3.79 (L) 3.87 - 5.11 MIL/uL   Hemoglobin 9.9 (L) 12.0 - 15.0 g/dL   HCT 78.5 (L) 36 - 46 %   MCV 81.8 80.0 - 100.0 fL   MCH 26.1 26.0 - 34.0 pg   MCHC 31.9 30.0 - 36.0 g/dL   RDW 88.5 (H) 02.7 - 74.1 %   Platelets 223 150 - 400 K/uL   nRBC 0.0 0.0 - 0.2 %  Iron and TIBC  Result Value Ref Range   Iron 153 28 - 170 ug/dL   TIBC 287 867 - 672 ug/dL   Saturation Ratios 42 (H) 10.4 - 31.8 %   UIBC 208 ug/dL  Ferritin  Result  Value Ref Range   Ferritin 13 11 - 307 ng/mL  Vitamin B12  Result Value Ref Range   Vitamin B-12 380 180 - 914 pg/mL  Folate  Result Value Ref Range   Folate 23.0 >5.9 ng/mL  CBC  Result Value Ref Range   WBC 7.6 4.0 - 10.5 K/uL   RBC 3.81 (L) 3.87 - 5.11 MIL/uL   Hemoglobin 9.9 (L) 12.0 - 15.0 g/dL   HCT 96.0 (L) 36 - 46 %   MCV 81.9 80.0 - 100.0 fL   MCH 26.0 26.0 - 34.0 pg   MCHC 31.7 30.0 - 36.0 g/dL   RDW 45.4 (H) 09.8 - 11.9 %   Platelets 261 150 - 400 K/uL   nRBC 0.0 0.0 - 0.2 %  Vitamin B12  Result Value Ref Range   Vitamin B-12 323 180 - 914 pg/mL  CBC  Result Value Ref Range   WBC 7.1 4.0 - 10.5 K/uL   RBC 3.43 (L) 3.87 - 5.11 MIL/uL   Hemoglobin 8.9 (L)  12.0 - 15.0 g/dL   HCT 14.7 (L) 36 - 46 %   MCV 82.2 80.0 - 100.0 fL   MCH 25.9 (L) 26.0 - 34.0 pg   MCHC 31.6 30.0 - 36.0 g/dL   RDW 82.9 (H) 56.2 - 13.0 %   Platelets 260 150 - 400 K/uL   nRBC 0.0 0.0 - 0.2 %  Type and screen [facility]  Result Value Ref Range   ABO/RH(D) A POS    Antibody Screen NEG    Sample Expiration 09/24/2020,2359    Unit Number [id number]    Blood Component Type RED CELLS,LR    Unit division 00    Status of Unit ISSUED,FINAL    Transfusion Status OK TO TRANSFUSE    Crossmatch Result      Compatible Performed at [facility], [address], [place], [place] [address]   Prepare RBC (crossmatch)  Result Value Ref Range   Order Confirmation      ORDER PROCESSED BY BLOOD BANK Performed at [facility], [address], [place], [place] [address]   BPAM [facility]  Result Value Ref Range   ISSUE DATE / TIME [id number]    Blood Product Unit Number [id number]    PRODUCT CODE [id number]    Unit Type and Rh 6200    Blood Product Expiration Date [id number]       Assessment & Plan:   Problem List Items Addressed This Visit      Cardiovascular and Mediastinum   Essential hypertension    Chronic, ongoing.  BP at goal for age today.  Continue current medication regimen and adjust as needed.  BMP and TSH today.  Recommend she monitor BP at home and document + focus on DASH diet.       Relevant Orders   TSH   US Venous Img Upper Uni Right(DVT)   Phlebitis    To lateral AC area, post IV insertion while in hospital.  Have recommended she start placing ice to area at home.  Concern for infection, will start Doxycycline.  Imaging order placed, recommended she obtain today, she reports she can not have done today -- will try to get her in tomorrow to assess area and r/o DVT.  If worsening will send to vascular.  Recommend if worsening symptoms immediately go to ER.  Avoid NSAIDs.  Return in one week.      Relevant  Orders   US Venous  Img Upper Uni Right(DVT)     Digestive   Diverticulosis    Referral to GI placed, she is to follow-up with them outpatient per discharge instructions.        Other   Acute blood loss anemia - Primary    Acute incident recently.  H/H today is improving with 9.6/30.9, MCV 86.1.  Will check B12, iron, ferritin today.  Recommend she continue daily iron supplement and take this with a little orange juice to enhance absorption.  Also recommend increase iron rich foods in diet.  Will place referrals to GI and hematology, whom she was instructed to follow-up with outpatient.  Return in one week.      Relevant Orders   CBC With Differential/Platelet   Basic metabolic panel   Iron, TIBC and Ferritin Panel   B12   Ambulatory referral to Gastroenterology   Ambulatory referral to Hematology   US Venous Img Upper Uni Right(DVT)   NSAID long-term use    She is instructed not to restart Meloxicam and to avoid OTC Ibuprofen products.  May use Tylenol only as needed for discomfort.         Time: 30 minutes, >50% spent counseling/or care coordination   Follow up plan: Return in about 1 week (around 10/05/2020) for Check on right arm.

## 2020-09-28 NOTE — Assessment & Plan Note (Signed)
She is instructed not to restart Meloxicam and to avoid OTC Ibuprofen products.  May use Tylenol only as needed for discomfort.

## 2020-09-28 NOTE — Assessment & Plan Note (Signed)
Referral to GI placed, she is to follow-up with them outpatient per discharge instructions.

## 2020-09-28 NOTE — Patient Instructions (Signed)
Anemia  Anemia is a condition in which you do not have enough red blood cells or hemoglobin. Hemoglobin is a substance in red blood cells that carries oxygen. When you do not have enough red blood cells or hemoglobin (are anemic), your body cannot get enough oxygen and your organs may not work properly. As a result, you may feel very tired or have other problems. What are the causes? Common causes of anemia include:  Excessive bleeding. Anemia can be caused by excessive bleeding inside or outside the body, including bleeding from the intestine or from periods in women.  Poor nutrition.  Long-lasting (chronic) kidney, thyroid, and liver disease.  Bone marrow disorders.  Cancer and treatments for cancer.  HIV (human immunodeficiency virus) and AIDS (acquired immunodeficiency syndrome).  Treatments for HIV and AIDS.  Spleen problems.  Blood disorders.  Infections, medicines, and autoimmune disorders that destroy red blood cells. What are the signs or symptoms? Symptoms of this condition include:  Minor weakness.  Dizziness.  Headache.  Feeling heartbeats that are irregular or faster than normal (palpitations).  Shortness of breath, especially with exercise.  Paleness.  Cold sensitivity.  Indigestion.  Nausea.  Difficulty sleeping.  Difficulty concentrating. Symptoms may occur suddenly or develop slowly. If your anemia is mild, you may not have symptoms. How is this diagnosed? This condition is diagnosed based on:  Blood tests.  Your medical history.  A physical exam.  Bone marrow biopsy. Your health care provider may also check your stool (feces) for blood and may do additional testing to look for the cause of your bleeding. You may also have other tests, including:  Imaging tests, such as a CT scan or MRI.  Endoscopy.  Colonoscopy. How is this treated? Treatment for this condition depends on the cause. If you continue to lose a lot of blood, you may  need to be treated at a hospital. Treatment may include:  Taking supplements of iron, vitamin S31, or folic acid.  Taking a hormone medicine (erythropoietin) that can help to stimulate red blood cell growth.  Having a blood transfusion. This may be needed if you lose a lot of blood.  Making changes to your diet.  Having surgery to remove your spleen. Follow these instructions at home:  Take over-the-counter and prescription medicines only as told by your health care provider.  Take supplements only as told by your health care provider.  Follow any diet instructions that you were given.  Keep all follow-up visits as told by your health care provider. This is important. Contact a health care provider if:  You develop new bleeding anywhere in the body. Get help right away if:  You are very weak.  You are short of breath.  You have pain in your abdomen or chest.  You are dizzy or feel faint.  You have trouble concentrating.  You have bloody or black, tarry stools.  You vomit repeatedly or you vomit up blood. Summary  Anemia is a condition in which you do not have enough red blood cells or enough of a substance in your red blood cells that carries oxygen (hemoglobin).  Symptoms may occur suddenly or develop slowly.  If your anemia is mild, you may not have symptoms.  This condition is diagnosed with blood tests as well as a medical history and physical exam. Other tests may be needed.  Treatment for this condition depends on the cause of the anemia. This information is not intended to replace advice given to you by  your health care provider. Make sure you discuss any questions you have with your health care provider. Document Revised: 10/11/2017 Document Reviewed: 11/30/2016 Elsevier Patient Education  [place].

## 2020-09-28 NOTE — Assessment & Plan Note (Addendum)
To lateral AC area, post IV insertion while in hospital.  Have recommended she start placing ice to area at home.  Concern for infection, will start Doxycycline.  Imaging order placed, recommended she obtain today, she reports she can not have done today -- will try to get her in tomorrow to assess area and r/o DVT.  If worsening will send to vascular.  Recommend if worsening symptoms immediately go to ER.  Avoid NSAIDs.  Return in one week.

## 2020-09-28 NOTE — Assessment & Plan Note (Signed)
Chronic, ongoing.  BP at goal for age today.  Continue current medication regimen and adjust as needed.  BMP and TSH today.  Recommend she monitor BP at home and document + focus on DASH diet.

## 2020-09-28 NOTE — Assessment & Plan Note (Signed)
Acute incident recently.  H/H today is improving with 9.6/30.9, MCV 86.1.  Will check B12, iron, ferritin today.  Recommend she continue daily iron supplement and take this with a little orange juice to enhance absorption.  Also recommend increase iron rich foods in diet.  Will place referrals to GI and hematology, whom she was instructed to follow-up with outpatient.  Return in one week.

## 2020-09-29 ENCOUNTER — Telehealth: Payer: Self-pay

## 2020-09-29 ENCOUNTER — Telehealth: Payer: Self-pay | Admitting: Nurse Practitioner

## 2020-09-29 ENCOUNTER — Ambulatory Visit
Admission: RE | Admit: 2020-09-29 | Discharge: 2020-09-29 | Disposition: A | Discharge status: Home / Self Care | Payer: Medicare HMO | Source: Ambulatory Visit | Attending: Nurse Practitioner | Admitting: Nurse Practitioner

## 2020-09-29 ENCOUNTER — Other Ambulatory Visit: Payer: Self-pay | Admitting: Nurse Practitioner

## 2020-09-29 DIAGNOSIS — I809 Phlebitis and thrombophlebitis of unspecified site: Secondary | ICD-10-CM

## 2020-09-29 DIAGNOSIS — L539 Erythematous condition, unspecified: Secondary | ICD-10-CM | POA: Diagnosis not present

## 2020-09-29 DIAGNOSIS — I10 Essential (primary) hypertension: Secondary | ICD-10-CM | POA: Insufficient documentation

## 2020-09-29 DIAGNOSIS — D62 Acute posthemorrhagic anemia: Secondary | ICD-10-CM

## 2020-09-29 DIAGNOSIS — R600 Localized edema: Secondary | ICD-10-CM | POA: Diagnosis not present

## 2020-09-29 LAB — BASIC METABOLIC PANEL
BUN/Creatinine Ratio: 17 (ref 12–28)
BUN: 14 mg/dL (ref 10–36)
CO2: 24 mmol/L (ref 20–29)
Calcium: 9 mg/dL (ref 8.7–10.3)
Chloride: 102 mmol/L (ref 96–106)
Creatinine, Ser: 0.84 mg/dL (ref 0.57–1.00)
GFR calc Af Amer: 70 mL/min/{1.73_m2} (ref >59)
GFR calc non Af Amer: 61 mL/min/{1.73_m2} (ref >59)
Glucose: 101 mg/dL — ABNORMAL HIGH (ref 65–99)
Potassium: 3.9 mmol/L (ref 3.5–5.2)
Sodium: 139 mmol/L (ref 134–144)

## 2020-09-29 LAB — IRON,TIBC AND FERRITIN PANEL
Ferritin: 531 ng/mL — ABNORMAL HIGH (ref 15–150)
Iron Saturation: 15 % (ref 15–55)
Iron: 40 ug/dL (ref 27–139)
Total Iron Binding Capacity: 263 ug/dL (ref 250–450)
UIBC: 223 ug/dL (ref 118–369)

## 2020-09-29 LAB — VITAMIN B12: Vitamin B-12: 649 pg/mL (ref 232–1245)

## 2020-09-29 LAB — TSH: TSH: 4.71 u[IU]/mL — ABNORMAL HIGH (ref 0.450–4.500)

## 2020-09-29 MED ORDER — LIDOCAINE 5 % EX PTCH: 1.0000 | MEDICATED_PATCH | CUTANEOUS | 0 refills | Status: DC

## 2020-09-29 NOTE — Progress Notes (Signed)
Good morning, please let Rhonda Ingram know labs have returned.  Kidney function remains stable.  Iron level is on low side of normal, please continue your iron daily and there is a referral to hematology as advised.  B12 level is normal.  Your thyroid labs this check was mildly elevated, but under goal for your age.  We will recheck this next visit.  I will alert you to ultrasound results once they return, I see you are scheduled today:).  Any questions? Keep being awesome!!  Thank you for allowing me to participate in your care. Kindest regards, Mattie Novosel

## 2020-09-29 NOTE — Telephone Encounter (Signed)
[place] department called report on US done today. Office CMA Britney contacted Aura Dials NP.  Patient ok to be released to home. Patient will be instructed by stephanie that Aura Dials will contact the patient at home with the results.

## 2020-09-29 NOTE — Telephone Encounter (Signed)
Thank you, I have sent in [facility]aine patches to [facility] in [place] and recommend they use over the counter Tylenol as needed.  Due to her recent acute bleed want to avoid any medications that could cause recurrence of bleeding, so no Ibuprofen products.

## 2020-09-29 NOTE — Telephone Encounter (Signed)
Called patient and informed daughter Stanton Kidney that mother should avoid Ibuprofen due to recent acute bleed and that Lidocaine patches have been sent into the [facility] in [place].

## 2020-09-29 NOTE — Telephone Encounter (Signed)
Relayed message from [facility] DNP, Daughter Stanton Kidney verbalized understand that if mother had any increased pain, swelling or redness to go immediately to the ER.  Daughter also asked if you could send in something for Rhonda Ingram's increased back pain.

## 2020-09-29 NOTE — Addendum Note (Signed)
Addended by: Aura Dials T on: 09/29/2020 04:30 PM   Modules accepted: Orders

## 2020-09-29 NOTE — Telephone Encounter (Addendum)
Urgent referral to vascular placed to further assess right AC area.  Attempted to call patient and her daughter, DPR, no answer.  Left general HIPAA compliant message.

## 2020-09-30 ENCOUNTER — Telehealth: Payer: Self-pay

## 2020-09-30 NOTE — Telephone Encounter (Signed)
PA approved.

## 2020-09-30 NOTE — Telephone Encounter (Signed)
PA for Lidocaine patches initiated and submitted via Cover My Meds. Key: [id number]

## 2020-10-03 ENCOUNTER — Encounter (INDEPENDENT_AMBULATORY_CARE_PROVIDER_SITE_OTHER): Payer: Self-pay | Admitting: Vascular Surgery

## 2020-10-03 ENCOUNTER — Ambulatory Visit (INDEPENDENT_AMBULATORY_CARE_PROVIDER_SITE_OTHER): Payer: Medicare HMO | Admitting: Vascular Surgery

## 2020-10-03 ENCOUNTER — Other Ambulatory Visit: Payer: Self-pay

## 2020-10-03 VITALS — BP 143/80 | HR 80 | Ht 60.0 in | Wt 112.0 lb

## 2020-10-03 DIAGNOSIS — I809 Phlebitis and thrombophlebitis of unspecified site: Secondary | ICD-10-CM

## 2020-10-03 DIAGNOSIS — T801XXS Vascular complications following infusion, transfusion and therapeutic injection, sequela: Secondary | ICD-10-CM | POA: Diagnosis not present

## 2020-10-03 NOTE — Progress Notes (Signed)
MRN : [id number]  Rhonda Ingram is a 86 y.o. (06-30-1926) female who presents with chief complaint of  Chief Complaint  Patient presents with   New Patient (Initial Visit)    [name]. Thrombophebitis   .  History of Present Illness:   The patient presents to the office for evaluation of right antecubital STP.  Phlebitis was identified at [facility] by Duplex ultrasound.  The initial symptoms were pain and swelling in the right upper extremity.  It was associated with an IV placed for a blood transfusion.  The patient notes is now much less painful.  Symptoms are much better with elevation.  The patient notes minimal edema in the morning which steadily worsens throughout the day.    The patient has not been using compression therapy at this point.  No SOB or pleuritic chest pains.  No cough or hemoptysis.   Current Meds  Medication Sig   amoxicillin-clavulanate (AUGMENTIN) 875-125 MG tablet Take 1 tablet by mouth 2 (two) times daily for 7 days.   benazepril (LOTENSIN) 5 MG tablet Take 1 tablet (5 mg total) by mouth daily.   ferrous sulfate 324 MG TBEC Take 324 mg by mouth.   triamcinolone cream (KENALOG) 0.1 % APPLY TWICE DAILY AS DIRECTED   VITAMIN D PO Take by mouth daily.    Past Medical History:  Diagnosis Date   Arthritis    osteoarthritis   Hyperlipidemia    Hypertension    Osteoporosis    Vitamin D deficiency     Past Surgical History:  Procedure Laterality Date   CHOLECYSTECTOMY     ESOPHAGOGASTRODUODENOSCOPY (EGD) WITH PROPOFOL N/A 09/23/2020   Procedure: ESOPHAGOGASTRODUODENOSCOPY (EGD) WITH PROPOFOL;  Surgeon: [name], MD;  Location[facility] ENDOSCOPY;  Service: Gastroenterology;  Laterality: N/A;   EYE SURGERY     cataract extraction   FLEXIBLE SIGMOIDOSCOPY N/A 09/23/2020   Procedure: FLEXIBLE SIGMOIDOSCOPY;  Surgeon: [name], MD;  Location: [facility] ENDOSCOPY;  Service: Gastroenterology;  Laterality: N/A;   VENTRAL HERNIA REPAIR N/A  10/08/2016   Procedure: HERNIA REPAIR VENTRAL ADULT;  Surgeon: Kieth Brightly, MD;  Location: [facility] ORS;  Service: General;  Laterality: N/A;    Social History Social History   Tobacco Use   Smoking status: Never Smoker   Smokeless tobacco: Never Used  Building services engineer Use: Never used  Substance Use Topics   Alcohol use: No   Drug use: No    Family History Family History  Problem Relation Age of Onset   Hypertension Mother    Stroke Mother    Cancer Father        lung   Hypertension Sister    Hyperlipidemia Sister    Hypertension Daughter    Cerebral palsy Daughter    Hypertension Maternal Grandmother    Hypertension Maternal Grandfather    Hypertension Paternal Grandmother    Hypertension Paternal Grandfather    Hypertension Sister    Hyperlipidemia Sister    Migraines Daughter    Diabetes Brother    Hypertension Brother    Hyperlipidemia Brother   No family history of bleeding/clotting disorders, porphyria or autoimmune disease   Allergies  Allergen Reactions   Alpha-D-Galactosidase Itching and Shortness Of Breath   Meat Extract Shortness Of Breath   Other     Alpha-gal allergy (meat allergy or Mammalian Meat Allergy) beef/red meat/pork     REVIEW OF SYSTEMS (Negative unless checked)  Constitutional: [] Weight loss  [] Fever  [] Chills Cardiac: [] Chest pain   []   Chest pressure   [] Palpitations   [] Shortness of breath when laying flat   [] Shortness of breath with exertion. Vascular:  [] Pain in legs with walking   [] Pain in legs at rest  [] History of DVT   [] Phlebitis   [] Swelling in legs   [] Varicose veins   [] Non-healing ulcers Pulmonary:   [] Uses home oxygen   [] Productive cough   [] Hemoptysis   [] Wheeze  [] COPD   [] Asthma Neurologic:  [] Dizziness   [] Seizures   [] History of stroke   [] History of TIA  [] Aphasia   [] Vissual changes   [] Weakness or numbness in arm   [] Weakness or numbness in leg Musculoskeletal:   [] Joint  swelling   [] Joint pain   [] Low back pain Hematologic:  [x] Easy bruising  [] Easy bleeding   [] Hypercoagulable state   [x] Anemic Gastrointestinal:  [] Diarrhea   [] Vomiting  [] Gastroesophageal reflux/heartburn   [] Difficulty swallowing. Genitourinary:  [] Chronic kidney disease   [] Difficult urination  [] Frequent urination   [] Blood in urine Skin:  [] Rashes   [] Ulcers  Psychological:  [] History of anxiety   []  History of major depression.  Physical Examination  Vitals:   10/03/20 1318  BP: (!) 143/80  Pulse: 80  Weight: 112 lb (50.8 kg)  Height: 5' (1.524 m)   Body mass index is 21.87 kg/m. Gen: WD/WN, NAD Head: [place]/AT, No temporalis wasting.  Ear/Nose/Throat: Hearing grossly intact, nares w/o erythema or drainage, poor dentition Eyes: PER, EOMI, sclera nonicteric.  Neck: Supple, no masses.  No bruit or JVD.  Pulmonary:  Good air movement, clear to auscultation bilaterally, no use of accessory muscles.  Cardiac: RRR, normal S1, S2, no Murmurs. Vascular: palpable cord right antecubital fossa, mildly tender Vessel Right Left  Radial Palpable Palpable  Brachial Palpable Palpable  Gastrointestinal: soft, non-distended. No guarding/no peritoneal signs.  Musculoskeletal: M/S 5/5 throughout.  No deformity or atrophy.  Neurologic: CN 2-12 intact. Pain and light touch intact in extremities.  Symmetrical.  Speech is fluent. Motor exam as listed above. Psychiatric: Judgment intact, Mood & affect appropriate for pt's clinical situation. Dermatologic: No rashes or ulcers noted.  No changes consistent with cellulitis.   CBC Lab Results  Component Value Date   WBC 7.4 09/28/2020   HGB 9.6 (L) 09/28/2020   HCT 30.9 (L) 09/28/2020   MCV 86 09/28/2020   PLT 385 09/28/2020    BMET    Component Value Date/Time   NA 139 09/28/2020 1028   NA 143 07/16/2012 0434   K 3.9 09/28/2020 1028   K 3.9 07/16/2012 0434   CL 102 09/28/2020 1028   CL 109 (H) 07/16/2012 0434   CO2 24 09/28/2020 1028    CO2 32 07/16/2012 0434   GLUCOSE 101 (H) 09/28/2020 1028   GLUCOSE 113 (H) 09/22/2020 0428   GLUCOSE 92 07/16/2012 0434   BUN 14 09/28/2020 1028   BUN 7 07/16/2012 0434   CREATININE 0.84 09/28/2020 1028   CREATININE 0.66 07/16/2012 0434   CALCIUM 9.0 09/28/2020 1028   CALCIUM 8.0 (L) 07/16/2012 0434   GFRNONAA 61 09/28/2020 1028   GFRNONAA >60 09/22/2020 0428   GFRNONAA >60 07/16/2012 0434   GFRAA 70 09/28/2020 1028   GFRAA >60 07/16/2012 0434   Estimated Creatinine Clearance: 30.7 mL/min (by C-G formula based on SCr of 0.84 mg/dL).  COAG Lab Results  Component Value Date   INR 1.0 07/05/2012    Radiology CT ABDOMEN PELVIS W CONTRAST  Result Date: 09/21/2020 CLINICAL DATA:  Diverticulitis suspected. Epigastric pain. Rectal bleeding x2  days. EXAM: CT ABDOMEN AND PELVIS WITH CONTRAST TECHNIQUE: Multidetector CT imaging of the abdomen and pelvis was performed using the standard protocol following bolus administration of intravenous contrast. CONTRAST:  39mL OMNIPAQUE IOHEXOL 300 MG/ML  SOLN COMPARISON:  02/18/2020 FINDINGS: Lower chest: The lung bases are clear. The heart size is normal. Hepatobiliary: The liver is normal. Status post cholecystectomy.There is mild intrahepatic and extrahepatic biliary ductal dilatation which is stable from prior study. Pancreas: Normal contours without ductal dilatation. No peripancreatic fluid collection. Spleen: Unremarkable. Adrenals/Urinary Tract: --Adrenal glands: Unremarkable. --Right kidney/ureter: No hydronephrosis or radiopaque kidney stones. --Left kidney/ureter: No hydronephrosis or radiopaque kidney stones. --Urinary bladder: Unremarkable. Stomach/Bowel: --Stomach/Duodenum: No hiatal hernia or other gastric abnormality. Normal duodenal course and caliber. --Small bowel: Unremarkable. --Colon: There is severe sigmoid diverticulosis. --Appendix: Normal. Vascular/Lymphatic: Atherosclerotic calcification is present within the non-aneurysmal  abdominal aorta, without hemodynamically significant stenosis. --No retroperitoneal lymphadenopathy. --No mesenteric lymphadenopathy. --No pelvic or inguinal lymphadenopathy. Reproductive: Unremarkable Other: No ascites or free air. The abdominal wall is normal. Musculoskeletal. No acute displaced fractures. IMPRESSION: 1. No acute abdominopelvic abnormality. 2. Severe sigmoid diverticulosis without CT evidence for diverticulitis. Aortic Atherosclerosis (ICD10-I70.0). Electronically Signed   By: Katherine Mantle M.D.   On: 09/21/2020 23:26   US Venous Img Upper Uni Right(DVT)  Result Date: 09/29/2020 CLINICAL DATA:  Antecubital irritation, redness, edema post IV. EXAM: RIGHT UPPER EXTREMITY VENOUS DOPPLER ULTRASOUND TECHNIQUE: Gray-scale sonography with graded compression, as well as color Doppler and duplex ultrasound were performed to evaluate the upper extremity deep venous system from the level of the subclavian vein and including the jugular, axillary, basilic, radial, ulnar and upper cephalic vein. Spectral Doppler was utilized to evaluate flow at rest and with distal augmentation maneuvers. COMPARISON:  No prior. FINDINGS: Contralateral Subclavian Vein: Respiratory phasicity is normal and symmetric with the symptomatic side. No evidence of thrombus. Normal compressibility. Internal Jugular Vein: No evidence of thrombus. Normal compressibility, respiratory phasicity and response to augmentation. Subclavian Vein: No evidence of thrombus. Normal compressibility, respiratory phasicity and response to augmentation. Axillary Vein: No evidence of thrombus. Normal compressibility, respiratory phasicity and response to augmentation. Cephalic Vein: Small cephalic venous branch thrombus noted in the region of clinical concern. Cephalic vein patent. Basilic Vein: No evidence of thrombus. Normal compressibility, respiratory phasicity and response to augmentation. Brachial Veins: No evidence of thrombus. Normal  compressibility, respiratory phasicity and response to augmentation. Radial Veins: No evidence of thrombus. Normal compressibility, respiratory phasicity and response to augmentation. Ulnar Veins: No evidence of thrombus. Normal compressibility, respiratory phasicity and response to augmentation. Venous Reflux:  None visualized. Other Findings:  None visualized. IMPRESSION: 1.  No evidence of DVT within the right upper extremity. 2. Small cephalic venous branch thrombus noted in the region of clinical concern. Cephalic vein patent. Electronically Signed   By: Maisie Fus  Register   On: 09/29/2020 15:54     Assessment/Plan 1. Phlebitis after infusion, sequela The patient has resolving superficial thrombophlebitis secondary to IV placement.  At this point there is no evidence for infection and therefore no surgical intervention is indicated.  Overall she states she is much better at this time than she was early on.  I did offer she could do hot or cold compresses to this area as she feels the need.  She can also use Tylenol to control the pain.  No further intervention at this time.  She will follow-up with me on an as-needed basis or if her symptoms seem to worsen.    Earl Lites  Glendi Mohiuddin, MD  10/03/2020 1:26 PM

## 2020-10-04 ENCOUNTER — Telehealth: Payer: Self-pay | Admitting: Nurse Practitioner

## 2020-10-04 ENCOUNTER — Ambulatory Visit: Payer: Medicare HMO | Admitting: Nurse Practitioner

## 2020-10-04 DIAGNOSIS — K5730 Diverticulosis of large intestine without perforation or abscess without bleeding: Secondary | ICD-10-CM | POA: Diagnosis not present

## 2020-10-04 DIAGNOSIS — K922 Gastrointestinal hemorrhage, unspecified: Secondary | ICD-10-CM | POA: Diagnosis not present

## 2020-10-04 NOTE — Telephone Encounter (Signed)
Copied from CRM [id number]. Topic: Medicare AWV >> Oct 04, 2020 12:31 PM Claudette Laws R wrote: Reason for CRM: No answer unable to leave message for patient to call back and schedule the Medicare Annual Wellness Visit (AWV) virtually.  Last AWV 08/03/2019  Please schedule at anytime with [facility]-Nurse Health Advisor.  45 minute appointment  Any questions, please call me at [phone]

## 2020-10-05 NOTE — Progress Notes (Signed)
[facility]  [phone] [phone]  ID: Aileen Amore Valli OB: [birth year]  MR#: [id number]  YSH#:[id number]  Patient Care Team: Marjie Skiff, NP as PCP - General (Nurse Practitioner) Steele Sizer, MD (Family Medicine)  CHIEF COMPLAINT: Anemia due to blood loss.   INTERVAL HISTORY: Patient is a 86 year old female who was recently discharged from the hospital after having GI bleed possibly secondary to diverticulitis. She received 1 unit of packed red blood cells as well as multiple iron infusions during her hospital stay. She currently feels well and nearly back to her baseline. She has no neurologic complaints. She denies any recent fevers or illnesses. She has good appetite and denies weight loss. She has no chest pain, shortness of breath, cough, or hemoptysis. She denies any nausea, vomiting, constipation, or diarrhea. She has no further melena or hematochezia. She has no urinary complaints. Patient offers no further specific complaints today.  REVIEW OF SYSTEMS:   Review of Systems  Constitutional: Negative.  Negative for fever, malaise/fatigue and weight loss.  Respiratory: Negative.  Negative for cough, hemoptysis and shortness of breath.   Cardiovascular: Negative.  Negative for chest pain and leg swelling.  Gastrointestinal: Negative.  Negative for abdominal pain, blood in stool and melena.  Genitourinary: Negative.  Negative for hematuria.  Musculoskeletal: Negative.  Negative for back pain.  Skin: Negative.  Negative for rash.  Neurological: Negative.  Negative for dizziness, focal weakness, weakness and headaches.  Psychiatric/Behavioral: Negative.  The patient is not nervous/anxious.     As per HPI. Otherwise, a complete review of systems is negative.  PAST MEDICAL HISTORY: Past Medical History:  Diagnosis Date  . Arthritis    osteoarthritis  . Hyperlipidemia   . Hypertension   . Osteoporosis   . Vitamin D deficiency      PAST SURGICAL HISTORY: Past Surgical History:  Procedure Laterality Date  . CHOLECYSTECTOMY    . ESOPHAGOGASTRODUODENOSCOPY (EGD) WITH PROPOFOL N/A 09/23/2020   Procedure: ESOPHAGOGASTRODUODENOSCOPY (EGD) WITH PROPOFOL;  Surgeon: [name], MD;  Location: [facility] ENDOSCOPY;  Service: Gastroenterology;  Laterality: N/A;  . EYE SURGERY     cataract extraction  . FLEXIBLE SIGMOIDOSCOPY N/A 09/23/2020   Procedure: FLEXIBLE SIGMOIDOSCOPY;  Surgeon: [name], MD;  Location: [facility] ENDOSCOPY;  Service: Gastroenterology;  Laterality: N/A;  . VENTRAL HERNIA REPAIR N/A 10/08/2016   Procedure: HERNIA REPAIR VENTRAL ADULT;  Surgeon: Kieth Brightly, MD;  Location: [facility] ORS;  Service: General;  Laterality: N/A;    FAMILY HISTORY: Family History  Problem Relation Age of Onset  . Hypertension Mother   . Stroke Mother   . Cancer Father        lung  . Hypertension Sister   . Hyperlipidemia Sister   . Hypertension Daughter   . Cerebral palsy Daughter   . Hypertension Maternal Grandmother   . Hypertension Maternal Grandfather   . Hypertension Paternal Grandmother   . Hypertension Paternal Grandfather   . Hypertension Sister   . Hyperlipidemia Sister   . Migraines Daughter   . Diabetes Brother   . Hypertension Brother   . Hyperlipidemia Brother     ADVANCED DIRECTIVES (Y/N):  N  HEALTH MAINTENANCE: Social History   Tobacco Use  . Smoking status: Never Smoker  . Smokeless tobacco: Never Used  Vaping Use  . Vaping Use: Never used  Substance Use Topics  . Alcohol use: No  . Drug use: No     Colonoscopy:  PAP:  Bone density:  Lipid panel:  Allergies  Allergen Reactions  . Alpha-D-Galactosidase Itching and Shortness Of Breath  . Meat Extract Shortness Of Breath  . Other     Alpha-gal allergy (meat allergy or Mammalian Meat Allergy) beef/red meat/pork    Current Outpatient Medications  Medication Sig Dispense Refill  . benazepril (LOTENSIN) 5 MG tablet Take 1  tablet (5 mg total) by mouth daily. 90 tablet 1  . ferrous sulfate 324 MG TBEC Take 324 mg by mouth.    [name] VITAMIN D PO Take by mouth daily.    . cyclobenzaprine (FLEXERIL) 5 MG tablet Take 5 mg by mouth as needed. (Patient not taking: Reported on 10/03/2020)    . fluticasone (FLONASE) 50 MCG/ACT nasal spray Place 1-2 sprays into both nostrils daily as needed (for allergies.).  (Patient not taking: Reported on 10/03/2020)    . lidocaine (LIDODERM) 5 % Place 1 patch onto the skin daily. Remove & Discard patch within 12 hours or as directed by MD (Patient not taking: Reported on 10/03/2020) 30 patch 0  . pantoprazole (PROTONIX) 40 MG tablet Take 1 tablet (40 mg total) by mouth daily. (Patient not taking: Reported on 10/03/2020) 90 tablet 1  . triamcinolone cream (KENALOG) 0.1 % APPLY TWICE DAILY AS DIRECTED (Patient not taking: Reported on 10/11/2020) 30 g 2   No current facility-administered medications for this visit.    OBJECTIVE: Vitals:   10/11/20 1328  BP: 127/88  Pulse: 86  Temp: 99.7 F (37.6 C)  SpO2: 96%     Body mass index is 21.56 kg/m.    ECOG FS:0 - Asymptomatic  General: Well-developed, well-nourished, no acute distress. Eyes: Pink conjunctiva, anicteric sclera. HEENT: Normocephalic, moist mucous membranes. Lungs: No audible wheezing or coughing. Heart: Regular rate and rhythm. Abdomen: Soft, nontender, no obvious distention. Musculoskeletal: No edema, cyanosis, or clubbing. Neuro: Alert, answering all questions appropriately. Cranial nerves grossly intact. Skin: No rashes or petechiae noted. Psych: Normal affect. Lymphatics: No cervical, calvicular, axillary or inguinal LAD.   LAB RESULTS:  Lab Results  Component Value Date   NA 139 09/28/2020   K 3.9 09/28/2020   CL 102 09/28/2020   CO2 24 09/28/2020   GLUCOSE 101 (H) 09/28/2020   BUN 14 09/28/2020   CREATININE 0.84 09/28/2020   CALCIUM 9.0 09/28/2020   PROT 6.9 09/21/2020   ALBUMIN 3.2 (L) 09/21/2020    AST 19 09/21/2020   ALT 10 09/21/2020   ALKPHOS 55 09/21/2020   BILITOT 0.3 09/21/2020   GFRNONAA 61 09/28/2020   GFRAA 70 09/28/2020    Lab Results  Component Value Date   WBC 4.4 10/11/2020   NEUTROABS 2.2 10/11/2020   HGB 10.9 (L) 10/11/2020   HCT 35.1 (L) 10/11/2020   MCV 87.1 10/11/2020   PLT 265 10/11/2020     STUDIES: CT ABDOMEN PELVIS W CONTRAST  Result Date: 09/21/2020 CLINICAL DATA:  Diverticulitis suspected. Epigastric pain. Rectal bleeding x2 days. EXAM: CT ABDOMEN AND PELVIS WITH CONTRAST TECHNIQUE: Multidetector CT imaging of the abdomen and pelvis was performed using the standard protocol following bolus administration of intravenous contrast. CONTRAST:  81mL OMNIPAQUE IOHEXOL 300 MG/ML  SOLN COMPARISON:  02/18/2020 FINDINGS: Lower chest: The lung bases are clear. The heart size is normal. Hepatobiliary: The liver is normal. Status post cholecystectomy.There is mild intrahepatic and extrahepatic biliary ductal dilatation which is stable from prior study. Pancreas: Normal contours without ductal dilatation. No peripancreatic fluid collection. Spleen: Unremarkable. Adrenals/Urinary Tract: --Adrenal glands: Unremarkable. --Right kidney/ureter: No hydronephrosis or radiopaque kidney stones. --Left  kidney/ureter: No hydronephrosis or radiopaque kidney stones. --Urinary bladder: Unremarkable. Stomach/Bowel: --Stomach/Duodenum: No hiatal hernia or other gastric abnormality. Normal duodenal course and caliber. --Small bowel: Unremarkable. --Colon: There is severe sigmoid diverticulosis. --Appendix: Normal. Vascular/Lymphatic: Atherosclerotic calcification is present within the non-aneurysmal abdominal aorta, without hemodynamically significant stenosis. --No retroperitoneal lymphadenopathy. --No mesenteric lymphadenopathy. --No pelvic or inguinal lymphadenopathy. Reproductive: Unremarkable Other: No ascites or free air. The abdominal wall is normal. Musculoskeletal. No acute displaced  fractures. IMPRESSION: 1. No acute abdominopelvic abnormality. 2. Severe sigmoid diverticulosis without CT evidence for diverticulitis. Aortic Atherosclerosis (ICD10-I70.0). Electronically Signed   By: Katherine Mantle M.D.   On: 09/21/2020 23:26   US Venous Img Upper Uni Right(DVT)  Result Date: 09/29/2020 CLINICAL DATA:  Antecubital irritation, redness, edema post IV. EXAM: RIGHT UPPER EXTREMITY VENOUS DOPPLER ULTRASOUND TECHNIQUE: Gray-scale sonography with graded compression, as well as color Doppler and duplex ultrasound were performed to evaluate the upper extremity deep venous system from the level of the subclavian vein and including the jugular, axillary, basilic, radial, ulnar and upper cephalic vein. Spectral Doppler was utilized to evaluate flow at rest and with distal augmentation maneuvers. COMPARISON:  No prior. FINDINGS: Contralateral Subclavian Vein: Respiratory phasicity is normal and symmetric with the symptomatic side. No evidence of thrombus. Normal compressibility. Internal Jugular Vein: No evidence of thrombus. Normal compressibility, respiratory phasicity and response to augmentation. Subclavian Vein: No evidence of thrombus. Normal compressibility, respiratory phasicity and response to augmentation. Axillary Vein: No evidence of thrombus. Normal compressibility, respiratory phasicity and response to augmentation. Cephalic Vein: Small cephalic venous branch thrombus noted in the region of clinical concern. Cephalic vein patent. Basilic Vein: No evidence of thrombus. Normal compressibility, respiratory phasicity and response to augmentation. Brachial Veins: No evidence of thrombus. Normal compressibility, respiratory phasicity and response to augmentation. Radial Veins: No evidence of thrombus. Normal compressibility, respiratory phasicity and response to augmentation. Ulnar Veins: No evidence of thrombus. Normal compressibility, respiratory phasicity and response to augmentation.  Venous Reflux:  None visualized. Other Findings:  None visualized. IMPRESSION: 1.  No evidence of DVT within the right upper extremity. 2. Small cephalic venous branch thrombus noted in the region of clinical concern. Cephalic vein patent. Electronically Signed   By: Maisie Fus  Register   On: 09/29/2020 15:54    ASSESSMENT: Anemia due to blood loss.   PLAN:    1. Anemia due to blood loss: Patient's hemoglobin continues to trend up and is now 10.9. Her most recent iron stores were within normal limits. Suspect diverticular bleed, but sigmoidoscopy did not reveal any significant pathology. Patient denies any further bleeding. No intervention is needed. Return to clinic in 6 weeks with repeat laboratory work and further evaluation. 2. Diverticular bleed: Continue follow-up with GI as scheduled.  I spent a total of 45 minutes reviewing chart data, face-to-face evaluation with the patient, counseling and coordination of care as detailed above.   Patient expressed understanding and was in agreement with this plan. She also understands that She can call clinic at any time with any questions, concerns, or complaints.    Jeralyn Ruths, MD   10/11/2020 3:35 PM

## 2020-10-11 ENCOUNTER — Inpatient Hospital Stay: Payer: Medicare HMO

## 2020-10-11 ENCOUNTER — Encounter: Payer: Self-pay | Admitting: Oncology

## 2020-10-11 ENCOUNTER — Other Ambulatory Visit: Payer: Self-pay

## 2020-10-11 ENCOUNTER — Inpatient Hospital Stay: Payer: Medicare HMO | Attending: Oncology | Admitting: Oncology

## 2020-10-11 VITALS — BP 127/88 | HR 86 | Temp 99.7°F | Wt 110.4 lb

## 2020-10-11 DIAGNOSIS — Z8349 Family history of other endocrine, nutritional and metabolic diseases: Secondary | ICD-10-CM | POA: Diagnosis not present

## 2020-10-11 DIAGNOSIS — E785 Hyperlipidemia, unspecified: Secondary | ICD-10-CM | POA: Insufficient documentation

## 2020-10-11 DIAGNOSIS — D500 Iron deficiency anemia secondary to blood loss (chronic): Secondary | ICD-10-CM | POA: Insufficient documentation

## 2020-10-11 DIAGNOSIS — Z833 Family history of diabetes mellitus: Secondary | ICD-10-CM | POA: Diagnosis not present

## 2020-10-11 DIAGNOSIS — I10 Essential (primary) hypertension: Secondary | ICD-10-CM | POA: Insufficient documentation

## 2020-10-11 DIAGNOSIS — Z79899 Other long term (current) drug therapy: Secondary | ICD-10-CM | POA: Insufficient documentation

## 2020-10-11 DIAGNOSIS — E559 Vitamin D deficiency, unspecified: Secondary | ICD-10-CM | POA: Insufficient documentation

## 2020-10-11 DIAGNOSIS — K5791 Diverticulosis of intestine, part unspecified, without perforation or abscess with bleeding: Secondary | ICD-10-CM | POA: Diagnosis not present

## 2020-10-11 DIAGNOSIS — D62 Acute posthemorrhagic anemia: Secondary | ICD-10-CM | POA: Diagnosis not present

## 2020-10-11 DIAGNOSIS — Z8249 Family history of ischemic heart disease and other diseases of the circulatory system: Secondary | ICD-10-CM | POA: Insufficient documentation

## 2020-10-11 DIAGNOSIS — Z801 Family history of malignant neoplasm of trachea, bronchus and lung: Secondary | ICD-10-CM | POA: Insufficient documentation

## 2020-10-11 LAB — CBC WITH DIFFERENTIAL/PLATELET
Abs Immature Granulocytes: 0.01 10*3/uL (ref 0.00–0.07)
Basophils Absolute: 0 10*3/uL (ref 0.0–0.1)
Basophils Relative: 1 %
Eosinophils Absolute: 0.1 10*3/uL (ref 0.0–0.5)
Eosinophils Relative: 2 %
HCT: 35.1 % — ABNORMAL LOW (ref 36.0–46.0)
Hemoglobin: 10.9 g/dL — ABNORMAL LOW (ref 12.0–15.0)
Immature Granulocytes: 0 %
Lymphocytes Relative: 30 %
Lymphs Abs: 1.3 10*3/uL (ref 0.7–4.0)
MCH: 27 pg (ref 26.0–34.0)
MCHC: 31.1 g/dL (ref 30.0–36.0)
MCV: 87.1 fL (ref 80.0–100.0)
Monocytes Absolute: 0.7 10*3/uL (ref 0.1–1.0)
Monocytes Relative: 16 %
Neutro Abs: 2.2 10*3/uL (ref 1.7–7.7)
Neutrophils Relative %: 51 %
Platelets: 265 10*3/uL (ref 150–400)
RBC: 4.03 MIL/uL (ref 3.87–5.11)
RDW: 16.3 % — ABNORMAL HIGH (ref 11.5–15.5)
WBC: 4.4 10*3/uL (ref 4.0–10.5)
nRBC: 0 % (ref 0.0–0.2)

## 2020-11-08 ENCOUNTER — Other Ambulatory Visit: Payer: Self-pay

## 2020-11-08 ENCOUNTER — Encounter: Payer: Self-pay | Admitting: Family Medicine

## 2020-11-08 ENCOUNTER — Ambulatory Visit (INDEPENDENT_AMBULATORY_CARE_PROVIDER_SITE_OTHER): Payer: Medicare HMO | Admitting: Family Medicine

## 2020-11-08 VITALS — BP 136/81 | HR 85 | Temp 98.2°F | Wt 111.0 lb

## 2020-11-08 DIAGNOSIS — I10 Essential (primary) hypertension: Secondary | ICD-10-CM | POA: Diagnosis not present

## 2020-11-08 DIAGNOSIS — E559 Vitamin D deficiency, unspecified: Secondary | ICD-10-CM

## 2020-11-08 NOTE — Progress Notes (Signed)
BP 136/81   Pulse 85   Temp 98.2 F (36.8 C)   Wt 111 lb (50.3 kg)   LMP  (LMP Unknown)   SpO2 98%   BMI 21.68 kg/m    Subjective:    Patient ID: Rhonda Ingram, female    DOB: 02-22-1926, 86 y.o.   MRN: [id number]  HPI: Rhonda Ingram is a 86 y.o. female  Chief Complaint  Patient presents with  . Hypertension  . vitamin d   HYPERTENSION Hypertension status: controlled  Satisfied with current treatment? yes Duration of hypertension: chronic BP monitoring frequency:  not checking BP medication side effects:  no Medication compliance: excellent compliance Previous BP meds:benazepril Aspirin: no Recurrent headaches: no Visual changes: no Palpitations: no Dyspnea: no Chest pain: no Lower extremity edema: no Dizzy/lightheaded: no  Relevant past medical, surgical, family and social history reviewed and updated as indicated. Interim medical history since our last visit reviewed. Allergies and medications reviewed and updated.  Review of Systems  Constitutional: Negative.   Respiratory: Negative.   Cardiovascular: Negative.   Gastrointestinal: Negative.   Musculoskeletal: Positive for arthralgias and myalgias. Negative for back pain, gait problem, joint swelling, neck pain and neck stiffness.  Skin: Negative.   Psychiatric/Behavioral: Negative.     Per HPI unless specifically indicated above     Objective:    BP 136/81   Pulse 85   Temp 98.2 F (36.8 C)   Wt 111 lb (50.3 kg)   LMP  (LMP Unknown)   SpO2 98%   BMI 21.68 kg/m   Wt Readings from Last 3 Encounters:  11/08/20 111 lb (50.3 kg)  10/11/20 110 lb 6.4 oz (50.1 kg)  10/03/20 112 lb (50.8 kg)    Physical Exam Vitals and nursing note reviewed.  Constitutional:      General: She is not in acute distress.    Appearance: Normal appearance. She is not ill-appearing, toxic-appearing or diaphoretic.  HENT:     Head: Normocephalic and atraumatic.     Right Ear: External ear normal.     Left Ear:  External ear normal.     Nose: Nose normal.     Mouth/Throat:     Mouth: Mucous membranes are moist.     Pharynx: Oropharynx is clear.  Eyes:     General: No scleral icterus.       Right eye: No discharge.        Left eye: No discharge.     Extraocular Movements: Extraocular movements intact.     Conjunctiva/sclera: Conjunctivae normal.     Pupils: Pupils are equal, round, and reactive to light.  Cardiovascular:     Rate and Rhythm: Normal rate and regular rhythm.     Pulses: Normal pulses.     Heart sounds: Normal heart sounds. No murmur heard. No friction rub. No gallop.   Pulmonary:     Effort: Pulmonary effort is normal. No respiratory distress.     Breath sounds: Normal breath sounds. No stridor. No wheezing, rhonchi or rales.  Chest:     Chest wall: No tenderness.  Musculoskeletal:        General: Normal range of motion.     Cervical back: Normal range of motion and neck supple.  Skin:    General: Skin is warm and dry.     Capillary Refill: Capillary refill takes less than 2 seconds.     Coloration: Skin is not jaundiced or pale.     Findings: No bruising, erythema,  lesion or rash.  Neurological:     General: No focal deficit present.     Mental Status: She is alert and oriented to person, place, and time. Mental status is at baseline.  Psychiatric:        Mood and Affect: Mood normal.        Behavior: Behavior normal.        Thought Content: Thought content normal.        Judgment: Judgment normal.     Results for orders placed or performed in visit on 10/11/20  CBC with Differential/Platelet  Result Value Ref Range   WBC 4.4 4.0 - 10.5 K/uL   RBC 4.03 3.87 - 5.11 MIL/uL   Hemoglobin 10.9 (L) 12.0 - 15.0 g/dL   HCT 24.4 (L) 01.0 - 27.2 %   MCV 87.1 80.0 - 100.0 fL   MCH 27.0 26.0 - 34.0 pg   MCHC 31.1 30.0 - 36.0 g/dL   RDW 53.6 (H) 64.4 - 03.4 %   Platelets 265 150 - 400 K/uL   nRBC 0.0 0.0 - 0.2 %   Neutrophils Relative % 51 %   Neutro Abs 2.2 1.7 - 7.7  K/uL   Lymphocytes Relative 30 %   Lymphs Abs 1.3 0.7 - 4.0 K/uL   Monocytes Relative 16 %   Monocytes Absolute 0.7 0.1 - 1.0 K/uL   Eosinophils Relative 2 %   Eosinophils Absolute 0.1 0.0 - 0.5 K/uL   Basophils Relative 1 %   Basophils Absolute 0.0 0.0 - 0.1 K/uL   Immature Granulocytes 0 %   Abs Immature Granulocytes 0.01 0.00 - 0.07 K/uL      Assessment & Plan:   Problem List Items Addressed This Visit      Cardiovascular and Mediastinum   Essential hypertension    Under good control on current regimen. Continue current regimen. Continue to monitor. Call with any concerns. Refills up to date.        Relevant Orders   Basic metabolic panel     Other   Vitamin D deficiency - Primary    Rechecking labs today. Await results. Treat as needed.       Relevant Orders   VITAMIN D 25 Hydroxy (Vit-D Deficiency, Fractures)       Follow up plan: Return in about 3 months (around 02/06/2021) for with Jolene.

## 2020-11-08 NOTE — Assessment & Plan Note (Signed)
Under good control on current regimen. Continue current regimen. Continue to monitor. Call with any concerns. Refills up to date.   

## 2020-11-08 NOTE — Assessment & Plan Note (Signed)
Rechecking labs today. Await results. Treat as needed.  °

## 2020-11-09 LAB — VITAMIN D 25 HYDROXY (VIT D DEFICIENCY, FRACTURES): Vit D, 25-Hydroxy: 40.3 ng/mL (ref 30.0–100.0)

## 2020-11-09 LAB — BASIC METABOLIC PANEL
BUN/Creatinine Ratio: 26 (ref 12–28)
BUN: 19 mg/dL (ref 10–36)
CO2: 22 mmol/L (ref 20–29)
Calcium: 9.3 mg/dL (ref 8.7–10.3)
Chloride: 107 mmol/L — ABNORMAL HIGH (ref 96–106)
Creatinine, Ser: 0.73 mg/dL (ref 0.57–1.00)
GFR calc Af Amer: 83 mL/min/{1.73_m2} (ref >59)
GFR calc non Af Amer: 72 mL/min/{1.73_m2} (ref >59)
Glucose: 114 mg/dL — ABNORMAL HIGH (ref 65–99)
Potassium: 4.2 mmol/L (ref 3.5–5.2)
Sodium: 138 mmol/L (ref 134–144)

## 2020-11-10 ENCOUNTER — Encounter: Payer: Self-pay | Admitting: Family Medicine

## 2020-11-19 NOTE — Progress Notes (Signed)
[facility]  [phone] [phone]  ID: Rhonda Ingram OB: 17-Mar-1926  MR#: [id number]  QHU#:[id number]  Patient Care Team: Marjie Skiff, NP as PCP - General (Nurse Practitioner) Steele Sizer, MD (Family Medicine)  CHIEF COMPLAINT: Anemia due to blood loss.   INTERVAL HISTORY: Patient returns to clinic today for repeat laboratory work and further evaluation.  She currently feels well and is back to her baseline.  She does not complain of any weakness or fatigue.  She denies any further bleeding. She has no neurologic complaints. She denies any recent fevers or illnesses. She has a good appetite and denies weight loss. She has no chest pain, shortness of breath, cough, or hemoptysis. She denies any nausea, vomiting, constipation, or diarrhea. She has no melena or hematochezia. She has no urinary complaints.  Patient offers no specific complaints today.  REVIEW OF SYSTEMS:   Review of Systems  Constitutional: Negative.  Negative for fever, malaise/fatigue and weight loss.  Respiratory: Negative.  Negative for cough, hemoptysis and shortness of breath.   Cardiovascular: Negative.  Negative for chest pain and leg swelling.  Gastrointestinal: Negative.  Negative for abdominal pain, blood in stool and melena.  Genitourinary: Negative.  Negative for hematuria.  Musculoskeletal: Negative.  Negative for back pain.  Skin: Negative.  Negative for rash.  Neurological: Negative.  Negative for dizziness, focal weakness, weakness and headaches.  Psychiatric/Behavioral: Negative.  The patient is not nervous/anxious.     As per HPI. Otherwise, a complete review of systems is negative.  PAST MEDICAL HISTORY: Past Medical History:  Diagnosis Date  . Arthritis    osteoarthritis  . Hyperlipidemia   . Hypertension   . Osteoporosis   . Vitamin D deficiency     PAST SURGICAL HISTORY: Past Surgical History:  Procedure Laterality Date  .  CHOLECYSTECTOMY    . ESOPHAGOGASTRODUODENOSCOPY (EGD) WITH PROPOFOL N/A 09/23/2020   Procedure: ESOPHAGOGASTRODUODENOSCOPY (EGD) WITH PROPOFOL;  Surgeon: [name], MD;  Location: [facility] ENDOSCOPY;  Service: Gastroenterology;  Laterality: N/A;  . EYE SURGERY     cataract extraction  . FLEXIBLE SIGMOIDOSCOPY N/A 09/23/2020   Procedure: FLEXIBLE SIGMOIDOSCOPY;  Surgeon: [name], MD;  Location: [facility] ENDOSCOPY;  Service: Gastroenterology;  Laterality: N/A;  . VENTRAL HERNIA REPAIR N/A 10/08/2016   Procedure: HERNIA REPAIR VENTRAL ADULT;  Surgeon: Kieth Brightly, MD;  Location: [facility] ORS;  Service: General;  Laterality: N/A;    FAMILY HISTORY: Family History  Problem Relation Age of Onset  . Hypertension Mother   . Stroke Mother   . Cancer Father        lung  . Hypertension Sister   . Hyperlipidemia Sister   . Hypertension Daughter   . Cerebral palsy Daughter   . Hypertension Maternal Grandmother   . Hypertension Maternal Grandfather   . Hypertension Paternal Grandmother   . Hypertension Paternal Grandfather   . Hypertension Sister   . Hyperlipidemia Sister   . Migraines Daughter   . Diabetes Brother   . Hypertension Brother   . Hyperlipidemia Brother     ADVANCED DIRECTIVES (Y/N):  N  HEALTH MAINTENANCE: Social History   Tobacco Use  . Smoking status: Never Smoker  . Smokeless tobacco: Never Used  Vaping Use  . Vaping Use: Never used  Substance Use Topics  . Alcohol use: No  . Drug use: No     Colonoscopy:  PAP:  Bone density:  Lipid panel:  Allergies  Allergen Reactions  . Alpha-D-Galactosidase Itching and  Shortness Of Breath  . Meat Extract Shortness Of Breath  . Other     Alpha-gal allergy (meat allergy or Mammalian Meat Allergy) beef/red meat/pork    Current Outpatient Medications  Medication Sig Dispense Refill  . benazepril (LOTENSIN) 5 MG tablet Take 1 tablet (5 mg total) by mouth daily. 90 tablet 1  . ferrous sulfate 324 MG TBEC Take 324  mg by mouth.    [name] VITAMIN D PO Take by mouth daily.     No current facility-administered medications for this visit.    OBJECTIVE: Vitals:   11/24/20 1455  BP: (!) 156/69  Pulse: 77  Resp: 18  Temp: 99.6 F (37.6 C)     Body mass index is 21.48 kg/m.    ECOG FS:0 - Asymptomatic  General: Well-developed, well-nourished, no acute distress. Eyes: Pink conjunctiva, anicteric sclera. HEENT: Normocephalic, moist mucous membranes. Lungs: No audible wheezing or coughing. Heart: Regular rate and rhythm. Abdomen: Soft, nontender, no obvious distention. Musculoskeletal: No edema, cyanosis, or clubbing. Neuro: Alert, answering all questions appropriately. Cranial nerves grossly intact. Skin: No rashes or petechiae noted. Psych: Normal affect.   LAB RESULTS:  Lab Results  Component Value Date   NA 138 11/08/2020   K 4.2 11/08/2020   CL 107 (H) 11/08/2020   CO2 22 11/08/2020   GLUCOSE 114 (H) 11/08/2020   BUN 19 11/08/2020   CREATININE 0.73 11/08/2020   CALCIUM 9.3 11/08/2020   PROT 6.9 09/21/2020   ALBUMIN 3.2 (L) 09/21/2020   AST 19 09/21/2020   ALT 10 09/21/2020   ALKPHOS 55 09/21/2020   BILITOT 0.3 09/21/2020   GFRNONAA 72 11/08/2020   GFRAA 83 11/08/2020    Lab Results  Component Value Date   WBC 6.7 11/24/2020   NEUTROABS 3.9 11/24/2020   HGB 12.2 11/24/2020   HCT 37.4 11/24/2020   MCV 86.0 11/24/2020   PLT 236 11/24/2020   Lab Results  Component Value Date   IRON 40 09/28/2020   TIBC 263 09/28/2020   IRONPCTSAT 15 09/28/2020   Lab Results  Component Value Date   FERRITIN 531 (H) 09/28/2020     STUDIES: No results found.  ASSESSMENT: Anemia due to blood loss.   PLAN:    1. Anemia due to blood loss: Resolved.  Patient's hemoglobin and iron stores are now within normal limits. Suspect diverticular bleed, but sigmoidoscopy did not reveal any significant pathology. Patient denies any further bleeding.  No intervention is needed.  After lengthy  discussion with the patient and her daughter, no further follow-up is necessary.  Please refer patient back if there are any questions or concerns. 2. Diverticular bleed: Resolved.  Continue follow-up with GI as scheduled.  I spent a total of 20 minutes reviewing chart data, face-to-face evaluation with the patient, counseling and coordination of care as detailed above.   Patient expressed understanding and was in agreement with this plan. She also understands that She can call clinic at any time with any questions, concerns, or complaints.    Jeralyn Ruths, MD   11/25/2020 10:58 AM

## 2020-11-23 ENCOUNTER — Other Ambulatory Visit: Payer: Self-pay | Admitting: *Deleted

## 2020-11-23 DIAGNOSIS — D62 Acute posthemorrhagic anemia: Secondary | ICD-10-CM

## 2020-11-24 ENCOUNTER — Encounter: Payer: Self-pay | Admitting: Oncology

## 2020-11-24 ENCOUNTER — Inpatient Hospital Stay (HOSPITAL_BASED_OUTPATIENT_CLINIC_OR_DEPARTMENT_OTHER): Payer: Medicare HMO | Admitting: Oncology

## 2020-11-24 ENCOUNTER — Inpatient Hospital Stay: Payer: Medicare HMO | Attending: Oncology

## 2020-11-24 VITALS — BP 156/69 | HR 77 | Temp 99.6°F | Resp 18 | Wt 110.0 lb

## 2020-11-24 DIAGNOSIS — D508 Other iron deficiency anemias: Secondary | ICD-10-CM | POA: Insufficient documentation

## 2020-11-24 DIAGNOSIS — I10 Essential (primary) hypertension: Secondary | ICD-10-CM | POA: Diagnosis not present

## 2020-11-24 DIAGNOSIS — D62 Acute posthemorrhagic anemia: Secondary | ICD-10-CM

## 2020-11-24 DIAGNOSIS — E785 Hyperlipidemia, unspecified: Secondary | ICD-10-CM | POA: Diagnosis not present

## 2020-11-24 DIAGNOSIS — Z79899 Other long term (current) drug therapy: Secondary | ICD-10-CM | POA: Insufficient documentation

## 2020-11-24 LAB — CBC WITH DIFFERENTIAL/PLATELET
Abs Immature Granulocytes: 0.02 10*3/uL (ref 0.00–0.07)
Basophils Absolute: 0.1 10*3/uL (ref 0.0–0.1)
Basophils Relative: 1 %
Eosinophils Absolute: 0.1 10*3/uL (ref 0.0–0.5)
Eosinophils Relative: 1 %
HCT: 37.4 % (ref 36.0–46.0)
Hemoglobin: 12.2 g/dL (ref 12.0–15.0)
Immature Granulocytes: 0 %
Lymphocytes Relative: 29 %
Lymphs Abs: 1.9 10*3/uL (ref 0.7–4.0)
MCH: 28 pg (ref 26.0–34.0)
MCHC: 32.6 g/dL (ref 30.0–36.0)
MCV: 86 fL (ref 80.0–100.0)
Monocytes Absolute: 0.8 10*3/uL (ref 0.1–1.0)
Monocytes Relative: 11 %
Neutro Abs: 3.9 10*3/uL (ref 1.7–7.7)
Neutrophils Relative %: 58 %
Platelets: 236 10*3/uL (ref 150–400)
RBC: 4.35 MIL/uL (ref 3.87–5.11)
RDW: 15 % (ref 11.5–15.5)
WBC: 6.7 10*3/uL (ref 4.0–10.5)
nRBC: 0 % (ref 0.0–0.2)

## 2020-11-24 NOTE — Progress Notes (Signed)
Pt in for follow up, denies any concerns.,  Reports feeling better and drinking 1 boost a day.

## 2021-02-01 DIAGNOSIS — I700 Atherosclerosis of aorta: Secondary | ICD-10-CM | POA: Insufficient documentation

## 2021-02-06 ENCOUNTER — Other Ambulatory Visit: Payer: Self-pay

## 2021-02-06 ENCOUNTER — Ambulatory Visit (INDEPENDENT_AMBULATORY_CARE_PROVIDER_SITE_OTHER): Payer: Medicare HMO | Admitting: Nurse Practitioner

## 2021-02-06 ENCOUNTER — Encounter: Payer: Self-pay | Admitting: Nurse Practitioner

## 2021-02-06 VITALS — BP 138/64 | HR 81 | Temp 98.1°F | Wt 111.0 lb

## 2021-02-06 DIAGNOSIS — E039 Hypothyroidism, unspecified: Secondary | ICD-10-CM

## 2021-02-06 DIAGNOSIS — I10 Essential (primary) hypertension: Secondary | ICD-10-CM | POA: Diagnosis not present

## 2021-02-06 DIAGNOSIS — I700 Atherosclerosis of aorta: Secondary | ICD-10-CM | POA: Diagnosis not present

## 2021-02-06 DIAGNOSIS — D500 Iron deficiency anemia secondary to blood loss (chronic): Secondary | ICD-10-CM

## 2021-02-06 DIAGNOSIS — E782 Mixed hyperlipidemia: Secondary | ICD-10-CM

## 2021-02-06 DIAGNOSIS — D692 Other nonthrombocytopenic purpura: Secondary | ICD-10-CM

## 2021-02-06 DIAGNOSIS — E559 Vitamin D deficiency, unspecified: Secondary | ICD-10-CM

## 2021-02-06 MED ORDER — BENAZEPRIL HCL 5 MG PO TABS: 5.0000 mg | ORAL_TABLET | Freq: Every day | ORAL | 4 refills | Status: DC

## 2021-02-06 NOTE — Progress Notes (Signed)
BP 138/64   Pulse 81   Temp 98.1 F (36.7 C) (Oral)   Wt 111 lb (50.3 kg)   LMP  (LMP Unknown)   SpO2 96%   BMI 21.68 kg/m    Subjective:    Patient ID: Rhonda Ingram, female    DOB: [birth year], 87 y.o.   MRN: [id number]  HPI: Rhonda Ingram is a 87 y.o. female  Chief Complaint  Patient presents with  . Hypertension  . Vitamin D defiency   HYPERTENSION / HYPERLIPIDEMIA Continues on Benazepril daily.  No statin.  Recent GI bleed in November 2021 -- overall is improving.  Saw Dr. Orlie Dakin with hematology on 11/24/20 with levels showing normal limits -- H/H 12.2/37.4.  Vitamin D level history of being low, recent 3 months ago was 40.3.  TSH mild elevation last visit at 4.710. Satisfied with current treatment? yes Duration of hypertension: chronic BP monitoring frequency: not checking BP range:  BP medication side effects: no Duration of hyperlipidemia: chronic Aspirin: no Recent stressors: no Recurrent headaches: no Visual changes: no Palpitations: no Dyspnea: no Chest pain: no Lower extremity edema: no Dizzy/lightheaded: no  Relevant past medical, surgical, family and social history reviewed and updated as indicated. Interim medical history since our last visit reviewed. Allergies and medications reviewed and updated.  Review of Systems  Constitutional: Negative for activity change, appetite change, diaphoresis, fatigue and fever.  Respiratory: Negative for cough, chest tightness, shortness of breath and wheezing.   Cardiovascular: Negative for chest pain, palpitations and leg swelling.  Gastrointestinal: Negative.   Neurological: Negative.   Psychiatric/Behavioral: Negative.     Per HPI unless specifically indicated above     Objective:    BP 138/64   Pulse 81   Temp 98.1 F (36.7 C) (Oral)   Wt 111 lb (50.3 kg)   LMP  (LMP Unknown)   SpO2 96%   BMI 21.68 kg/m   Wt Readings from Last 3 Encounters:  02/06/21 111 lb (50.3 kg)  11/24/20 110 lb  (49.9 kg)  11/08/20 111 lb (50.3 kg)    Physical Exam Vitals and nursing note reviewed.  Constitutional:      General: She is awake. She is not in acute distress.    Appearance: She is well-developed and well-groomed. She is not ill-appearing.  HENT:     Head: Normocephalic.     Right Ear: Hearing normal.     Left Ear: Hearing normal.  Eyes:     General: Lids are normal.        Right eye: No discharge.        Left eye: No discharge.     Conjunctiva/sclera: Conjunctivae normal.     Pupils: Pupils are equal, round, and reactive to light.  Neck:     Thyroid: No thyromegaly.     Vascular: No carotid bruit.  Cardiovascular:     Rate and Rhythm: Normal rate and regular rhythm.     Heart sounds: Normal heart sounds. No murmur heard. No gallop.   Pulmonary:     Effort: Pulmonary effort is normal. No accessory muscle usage or respiratory distress.     Breath sounds: Normal breath sounds.  Abdominal:     General: Bowel sounds are normal.     Palpations: Abdomen is soft. There is no hepatomegaly or splenomegaly.  Musculoskeletal:     Cervical back: Normal range of motion and neck supple.     Right lower leg: No edema.     Left lower  leg: No edema.  Skin:    General: Skin is warm and dry.     Comments: Scattered pale purple bruises to bilateral upper extremities.  Neurological:     Mental Status: She is alert and oriented to person, place, and time.  Psychiatric:        Attention and Perception: Attention normal.        Mood and Affect: Mood normal.        Speech: Speech normal.        Behavior: Behavior normal. Behavior is cooperative.        Thought Content: Thought content normal.     Results for orders placed or performed in visit on 11/24/20  CBC with Differential/Platelet  Result Value Ref Range   WBC 6.7 4.0 - 10.5 K/uL   RBC 4.35 3.87 - 5.11 MIL/uL   Hemoglobin 12.2 12.0 - 15.0 g/dL   HCT 43.1 54.0 - 08.6 %   MCV 86.0 80.0 - 100.0 fL   MCH 28.0 26.0 - 34.0 pg    MCHC 32.6 30.0 - 36.0 g/dL   RDW 76.1 95.0 - 93.2 %   Platelets 236 150 - 400 K/uL   nRBC 0.0 0.0 - 0.2 %   Neutrophils Relative % 58 %   Neutro Abs 3.9 1.7 - 7.7 K/uL   Lymphocytes Relative 29 %   Lymphs Abs 1.9 0.7 - 4.0 K/uL   Monocytes Relative 11 %   Monocytes Absolute 0.8 0.1 - 1.0 K/uL   Eosinophils Relative 1 %   Eosinophils Absolute 0.1 0.0 - 0.5 K/uL   Basophils Relative 1 %   Basophils Absolute 0.1 0.0 - 0.1 K/uL   Immature Granulocytes 0 %   Abs Immature Granulocytes 0.02 0.00 - 0.07 K/uL      Assessment & Plan:   Problem List Items Addressed This Visit      Cardiovascular and Mediastinum   Essential hypertension    Chronic, ongoing.  BP at goal for age today.  Continue current medication regimen and adjust as needed.  CMP and TSH today.  Recommend she monitor BP at home and document + focus on DASH diet.  Return in 6 months.       Relevant Medications   benazepril (LOTENSIN) 5 MG tablet   Other Relevant Orders   Comprehensive metabolic panel   TSH   Senile purpura (HCC)    Ongoing, stable with no skin breakdown.  Recommend gentle skin care and monitor for wounds, if present then notify provider.      Relevant Medications   benazepril (LOTENSIN) 5 MG tablet   Aortic atherosclerosis (HCC) - Primary    Noted on imaging 09/21/20, at this time will monitor and avoid restart of ASA + statin.  History of GI bleed and advanced age.      Relevant Medications   benazepril (LOTENSIN) 5 MG tablet     Other   Hyperlipidemia    Noted on past labs, due to advanced age will avoid starting statin.      Relevant Medications   benazepril (LOTENSIN) 5 MG tablet   Vitamin D deficiency    Noted on past labs, with improvement.  Continue supplement daily and check level today.      Relevant Orders   VITAMIN D 25 Hydroxy (Vit-D Deficiency, Fractures)   Iron deficiency anemia due to chronic blood loss    Improving.  Will recheck CBC, iron, ferritin today.  Recommend she  continue daily iron supplement and take this  with a little orange juice to enhance absorption.  Also recommend increase iron rich foods in diet.  Return in 6 months.      Relevant Orders   CBC with Differential/Platelet   Iron, TIBC and Ferritin Panel    Other Visit Diagnoses    Hypothyroidism, unspecified type       Mild elevation TSH recent labs, recheck today and check Free T4.   Relevant Orders   T4, free       Follow up plan: Return in about 6 months (around 08/09/2021) for HTN, ANEMIA, VITAMIN D.

## 2021-02-06 NOTE — Assessment & Plan Note (Signed)
Improving.  Will recheck CBC, iron, ferritin today.  Recommend she continue daily iron supplement and take this with a little orange juice to enhance absorption.  Also recommend increase iron rich foods in diet.  Return in 6 months.

## 2021-02-06 NOTE — Assessment & Plan Note (Signed)
Ongoing, stable with no skin breakdown.  Recommend gentle skin care and monitor for wounds, if present then notify provider. 

## 2021-02-06 NOTE — Patient Instructions (Signed)
[employer] medicine (25th ed., pp. [phone]). [place], [place]: Elsevier.">  Anemia  Anemia is a condition in which there is not enough red blood cells or hemoglobin in the blood. Hemoglobin is a substance in red blood cells that carries oxygen. When you do not have enough red blood cells or hemoglobin (are anemic), your body cannot get enough oxygen and your organs may not work properly. As a result, you may feel very tired or have other problems. What are the causes? Common causes of anemia include:  Excessive bleeding. Anemia can be caused by excessive bleeding inside or outside the body, including bleeding from the intestines or from heavy menstrual periods in females.  Poor nutrition.  Long-lasting (chronic) kidney, thyroid, and liver disease.  Bone marrow disorders, spleen problems, and blood disorders.  Cancer and treatments for cancer.  HIV (human immunodeficiency virus) and AIDS (acquired immunodeficiency syndrome).  Infections, medicines, and autoimmune disorders that destroy red blood cells. What are the signs or symptoms? Symptoms of this condition include:  Minor weakness.  Dizziness.  Headache, or difficulties concentrating and sleeping.  Heartbeats that feel irregular or faster than normal (palpitations).  Shortness of breath, especially with exercise.  Pale skin, lips, and nails, or cold hands and feet.  Indigestion and nausea. Symptoms may occur suddenly or develop slowly. If your anemia is mild, you may not have symptoms. How is this diagnosed? This condition is diagnosed based on blood tests, your medical history, and a physical exam. In some cases, a test may be needed in which cells are removed from the soft tissue inside of a bone and looked at under a microscope (bone marrow biopsy). Your health care provider may also check your stool (feces) for blood and may do additional testing to look for the cause of your bleeding. Other tests may  include:  Imaging tests, such as a CT scan or MRI.  A procedure to see inside your esophagus and stomach (endoscopy).  A procedure to see inside your colon and rectum (colonoscopy). How is this treated? Treatment for this condition depends on the cause. If you continue to lose a lot of blood, you may need to be treated at a hospital. Treatment may include:  Taking supplements of iron, vitamin Q68, or folic acid.  Taking a hormone medicine (erythropoietin) that can help to stimulate red blood cell growth.  Having a blood transfusion. This may be needed if you lose a lot of blood.  Making changes to your diet.  Having surgery to remove your spleen. Follow these instructions at home:  Take over-the-counter and prescription medicines only as told by your health care provider.  Take supplements only as told by your health care provider.  Follow any diet instructions that you were given by your health care provider.  Keep all follow-up visits as told by your health care provider. This is important. Contact a health care provider if:  You develop new bleeding anywhere in the body. Get help right away if:  You are very weak.  You are short of breath.  You have pain in your abdomen or chest.  You are dizzy or feel faint.  You have trouble concentrating.  You have bloody stools, black stools, or tarry stools.  You vomit repeatedly or you vomit up blood. These symptoms may represent a serious problem that is an emergency. Do not wait to see if the symptoms will go away. Get medical help right away. Call your local emergency services (911 in the [place] Do not  drive yourself to the hospital. Summary  Anemia is a condition in which you do not have enough red blood cells or enough of a substance in your red blood cells that carries oxygen (hemoglobin).  Symptoms may occur suddenly or develop slowly.  If your anemia is mild, you may not have symptoms.  This condition is  diagnosed with blood tests, a medical history, and a physical exam. Other tests may be needed.  Treatment for this condition depends on the cause of the anemia. This information is not intended to replace advice given to you by your health care provider. Make sure you discuss any questions you have with your health care provider. Document Revised: 10/06/2019 Document Reviewed: 10/06/2019 Elsevier Patient Education  2021 [employer] Inc.  

## 2021-02-06 NOTE — Assessment & Plan Note (Signed)
Noted on past labs, with improvement.  Continue supplement daily and check level today.

## 2021-02-06 NOTE — Assessment & Plan Note (Addendum)
Noted on past labs, due to advanced age will avoid starting statin.

## 2021-02-06 NOTE — Assessment & Plan Note (Addendum)
Chronic, ongoing.  BP at goal for age today.  Continue current medication regimen and adjust as needed.  CMP and TSH today.  Recommend she monitor BP at home and document + focus on DASH diet.  Return in 6 months.

## 2021-02-06 NOTE — Assessment & Plan Note (Signed)
Noted on imaging 09/21/20, at this time will monitor and avoid restart of ASA + statin.  History of GI bleed and advanced age.

## 2021-02-07 LAB — COMPREHENSIVE METABOLIC PANEL
ALT: 6 IU/L (ref 0–32)
AST: 16 IU/L (ref 0–40)
Albumin/Globulin Ratio: 1.1 — ABNORMAL LOW (ref 1.2–2.2)
Albumin: 3.7 g/dL (ref 3.5–4.6)
Alkaline Phosphatase: 61 IU/L (ref 44–121)
BUN/Creatinine Ratio: 26 (ref 12–28)
BUN: 24 mg/dL (ref 10–36)
Bilirubin Total: 0.2 mg/dL (ref 0.0–1.2)
CO2: 22 mmol/L (ref 20–29)
Calcium: 9.4 mg/dL (ref 8.7–10.3)
Chloride: 100 mmol/L (ref 96–106)
Creatinine, Ser: 0.94 mg/dL (ref 0.57–1.00)
Globulin, Total: 3.3 g/dL (ref 1.5–4.5)
Glucose: 133 mg/dL — ABNORMAL HIGH (ref 65–99)
Potassium: 4.2 mmol/L (ref 3.5–5.2)
Sodium: 138 mmol/L (ref 134–144)
Total Protein: 7 g/dL (ref 6.0–8.5)
eGFR: 57 mL/min/{1.73_m2} — ABNORMAL LOW (ref >59)

## 2021-02-07 LAB — CBC WITH DIFFERENTIAL/PLATELET
Basophils Absolute: 0.1 10*3/uL (ref 0.0–0.2)
Basos: 1 %
EOS (ABSOLUTE): 0.1 10*3/uL (ref 0.0–0.4)
Eos: 1 %
Hematocrit: 37.9 % (ref 34.0–46.6)
Hemoglobin: 12.2 g/dL (ref 11.1–15.9)
Immature Grans (Abs): 0 10*3/uL (ref 0.0–0.1)
Immature Granulocytes: 0 %
Lymphocytes Absolute: 1.7 10*3/uL (ref 0.7–3.1)
Lymphs: 28 %
MCH: 27.5 pg (ref 26.6–33.0)
MCHC: 32.2 g/dL (ref 31.5–35.7)
MCV: 86 fL (ref 79–97)
Monocytes Absolute: 0.6 10*3/uL (ref 0.1–0.9)
Monocytes: 10 %
Neutrophils Absolute: 3.4 10*3/uL (ref 1.4–7.0)
Neutrophils: 60 %
Platelets: 260 10*3/uL (ref 150–450)
RBC: 4.43 x10E6/uL (ref 3.77–5.28)
RDW: 13.4 % (ref 11.7–15.4)
WBC: 5.8 10*3/uL (ref 3.4–10.8)

## 2021-02-07 LAB — VITAMIN D 25 HYDROXY (VIT D DEFICIENCY, FRACTURES): Vit D, 25-Hydroxy: 24.3 ng/mL — ABNORMAL LOW (ref 30.0–100.0)

## 2021-02-07 LAB — IRON,TIBC AND FERRITIN PANEL
Ferritin: 62 ng/mL (ref 15–150)
Iron Saturation: 18 % (ref 15–55)
Iron: 58 ug/dL (ref 27–139)
Total Iron Binding Capacity: 314 ug/dL (ref 250–450)
UIBC: 256 ug/dL (ref 118–369)

## 2021-02-07 LAB — TSH: TSH: 3.57 u[IU]/mL (ref 0.450–4.500)

## 2021-02-07 LAB — T4, FREE: Free T4: 0.93 ng/dL (ref 0.82–1.77)

## 2021-02-07 NOTE — Progress Notes (Signed)
Please let Rhonda Ingram know her labs have returned.  She continues to show very mild kidney disease, but no decline in this.  Sugar was a little elevated on labs this check at 133, recommend focus on lowering sugar in diet and foods heavy in carbohydrates like biscuits and bread.  Thyroid labs normal this check.  CBC shows much improved levels and iron level is stable with your supplement daily.  Vitamin D level remains on lower side, recommend you take Vitamin D3 1000 units daily which will be good for bone health.  Any questions? Keep being awesome!!  Thank you for allowing me to participate in your care. Kindest regards, Marwan Lipe

## 2021-08-09 ENCOUNTER — Other Ambulatory Visit: Payer: Self-pay

## 2021-08-09 ENCOUNTER — Encounter: Payer: Self-pay | Admitting: Nurse Practitioner

## 2021-08-09 ENCOUNTER — Ambulatory Visit (INDEPENDENT_AMBULATORY_CARE_PROVIDER_SITE_OTHER): Payer: Medicare HMO | Admitting: Nurse Practitioner

## 2021-08-09 VITALS — BP 137/76 | HR 74 | Temp 98.3°F | Wt 109.6 lb

## 2021-08-09 DIAGNOSIS — Z23 Encounter for immunization: Secondary | ICD-10-CM | POA: Diagnosis not present

## 2021-08-09 DIAGNOSIS — M1990 Unspecified osteoarthritis, unspecified site: Secondary | ICD-10-CM | POA: Insufficient documentation

## 2021-08-09 DIAGNOSIS — D692 Other nonthrombocytopenic purpura: Secondary | ICD-10-CM

## 2021-08-09 DIAGNOSIS — G4762 Sleep related leg cramps: Secondary | ICD-10-CM

## 2021-08-09 DIAGNOSIS — E782 Mixed hyperlipidemia: Secondary | ICD-10-CM

## 2021-08-09 DIAGNOSIS — M8949 Other hypertrophic osteoarthropathy, multiple sites: Secondary | ICD-10-CM

## 2021-08-09 DIAGNOSIS — E559 Vitamin D deficiency, unspecified: Secondary | ICD-10-CM | POA: Diagnosis not present

## 2021-08-09 DIAGNOSIS — I10 Essential (primary) hypertension: Secondary | ICD-10-CM | POA: Diagnosis not present

## 2021-08-09 DIAGNOSIS — D500 Iron deficiency anemia secondary to blood loss (chronic): Secondary | ICD-10-CM

## 2021-08-09 DIAGNOSIS — M159 Polyosteoarthritis, unspecified: Secondary | ICD-10-CM

## 2021-08-09 NOTE — Assessment & Plan Note (Signed)
Noted at night mainly.  Recommend starting Magnesium 500 MG by mouth at night.  Labs today CBC, iron/ferritin, TSH/Free T4 (had high levels at one point), B12, CMP, and Mag.  Return in 6 months, sooner if worsening.

## 2021-08-09 NOTE — Patient Instructions (Signed)
Take Tylenol 1000 MG three times a day as needed (Tylenol arthritis) Take Magnesium 500 MG by mouth at night for leg cramps.  Osteoarthritis Osteoarthritis is a type of arthritis. It refers to joint pain or joint disease. Osteoarthritis affects tissue that covers the ends of bones in joints (cartilage). Cartilage acts as a cushion between the bones and helps them move smoothly. Osteoarthritis occurs when cartilage in the joints gets worn down. Osteoarthritis is sometimes called "wear and tear" arthritis. Osteoarthritis is the most common form of arthritis. It often occurs in older people. It is a condition that gets worse over time. The joints most often affected by this condition are in the fingers, toes, hips, knees, and spine, including the neck and lower back. What are the causes? This condition is caused by the wearing down of cartilage that covers the ends of bones. What increases the risk? The following factors may make you more likely to develop this condition: Being age 5 or older. Obesity. Overuse of joints. Past injury of a joint. Past surgery on a joint. Family history of osteoarthritis. What are the signs or symptoms? The main symptoms of this condition are pain, swelling, and stiffness in the joint. Other symptoms may include: An enlarged joint. More pain and further damage caused by small pieces of bone or cartilage that break off and float inside of the joint. Small deposits of bone (osteophytes) that grow on the edges of the joint. A grating or scraping feeling inside the joint when you move it. Popping or creaking sounds when you move. Difficulty walking or exercising. An inability to grip items, twist your hand(s), or control the movements of your hands and fingers. How is this diagnosed? This condition may be diagnosed based on: Your medical history. A physical exam. Your symptoms. X-rays of the affected joint(s). Blood tests to rule out other types of  arthritis. How is this treated? There is no cure for this condition, but treatment can help control pain and improve joint function. Treatment may include a combination of therapies, such as: Pain relief techniques, such as: Applying heat and cold to the joint. Massage. A form of talk therapy called cognitive behavioral therapy (CBT). This therapy helps you set goals and follow up on the changes that you make. Medicines for pain and inflammation. The medicines can be taken by mouth or applied to the skin. They include: NSAIDs, such as ibuprofen. Prescription medicines. Strong anti-inflammatory medicines (corticosteroids). Certain nutritional supplements. A prescribed exercise program. You may work with a physical therapist. Assistive devices, such as a brace, wrap, splint, specialized glove, or cane. A weight control plan. Surgery, such as: An osteotomy. This is done to reposition the bones and relieve pain or to remove loose pieces of bone and cartilage. Joint replacement surgery. You may need this surgery if you have advanced osteoarthritis. Follow these instructions at home: Activity Rest your affected joints as told by your health care provider. Exercise as told by your health care provider. He or she may recommend specific types of exercise, such as: Strengthening exercises. These are done to strengthen the muscles that support joints affected by arthritis. Aerobic activities. These are exercises, such as brisk walking or water aerobics, that increase your heart rate. Range-of-motion activities. These help your joints move more easily. Balance and agility exercises. Managing pain, stiffness, and swelling   If directed, apply heat to the affected area as often as told by your health care provider. Use the heat source that your health care  provider recommends, such as a moist heat pack or a heating pad. If you have a removable assistive device, remove it as told by your health care  provider. Place a towel between your skin and the heat source. If your health care provider tells you to keep the assistive device on while you apply heat, place a towel between the assistive device and the heat source. Leave the heat on for 20-30 minutes. Remove the heat if your skin turns bright red. This is especially important if you are unable to feel pain, heat, or cold. You may have a greater risk of getting burned. If directed, put ice on the affected area. To do this: If you have a removable assistive device, remove it as told by your health care provider. Put ice in a plastic bag. Place a towel between your skin and the bag. If your health care provider tells you to keep the assistive device on during icing, place a towel between the assistive device and the bag. Leave the ice on for 20 minutes, 2-3 times a day. Move your fingers or toes often to reduce stiffness and swelling. Raise (elevate) the injured area above the level of your heart while you are sitting or lying down. General instructions Take over-the-counter and prescription medicines only as told by your health care provider. Maintain a healthy weight. Follow instructions from your health care provider for weight control. Do not use any products that contain nicotine or tobacco, such as cigarettes, e-cigarettes, and chewing tobacco. If you need help quitting, ask your health care provider. Use assistive devices as told by your health care provider. Keep all follow-up visits as told by your health care provider. This is important. Where to find more information [employer] of Arthritis and Musculoskeletal and Skin Diseases: www.niams.http://www.myers.net/ [employer] on Aging: https://walker.com/ American College of Rheumatology: www.rheumatology.org Contact a health care provider if: You have redness, swelling, or a feeling of warmth in a joint that gets worse. You have a fever along with joint or muscle aches. You develop a  rash. You have trouble doing your normal activities. Get help right away if: You have pain that gets worse and is not relieved by pain medicine. Summary Osteoarthritis is a type of arthritis that affects tissue covering the ends of bones in joints (cartilage). This condition is caused by the wearing down of cartilage that covers the ends of bones. The main symptom of this condition is pain, swelling, and stiffness in the joint. There is no cure for this condition, but treatment can help control pain and improve joint function. This information is not intended to replace advice given to you by your health care provider. Make sure you discuss any questions you have with your health care provider. Document Revised: 10/26/2019 Document Reviewed: 10/26/2019 Elsevier Patient Education  2022 [employer].

## 2021-08-09 NOTE — Assessment & Plan Note (Signed)
Improved.  Will recheck CBC, iron, ferritin today.  Recommend she continue daily iron supplement and take this with a little orange juice to enhance absorption.  Also recommend increase iron rich foods in diet.  Return in 6 months. ?

## 2021-08-09 NOTE — Assessment & Plan Note (Signed)
Noted on past labs, due to advanced age will avoid starting statin. 

## 2021-08-09 NOTE — Progress Notes (Signed)
BP 137/76 (BP Location: Left Arm)   Pulse 74   Temp 98.3 F (36.8 C) (Oral)   Wt 109 lb 9.6 oz (49.7 kg)   LMP  (LMP Unknown)   SpO2 98%   BMI 21.40 kg/m    Subjective:    Patient ID: Rhonda Ingram, female    DOB: 07/07/1926, 87 y.o.   MRN: [id number]  HPI: Rhonda Ingram is a 87 y.o. female  Chief Complaint  Patient presents with   Hypertension   Anemia   Vitamin D   Poor Circulation    Patient states she is not sure if she mentioned to [facility] at her last visit. Patient states at night she has leg cramps and toes as well. Patient states she has nerve damage in her legs and her feet.    Pain    Patient is complaining of some discomfort in her right arm and states she has had some falls and she says her muscles hurt "cross way" instead of up and down. Patient states it is tender under her right arm and patient daughter states she does not like having physical therapy.    HYPERTENSION / HYPERLIPIDEMIA Continues on Benazepril daily.  No statin.    Had GI bleed in November 2021 -- overall is improved with no further episodes.  Saw Dr. Grayland Ormond with hematology on 11/24/20 with levels showing normal limits -- recent March H/H 12.2/37.9, iron 58.  Vitamin D level history of being low, recent 24.3.   Satisfied with current treatment? yes Duration of hypertension: chronic BP monitoring frequency: not checking BP range:  BP medication side effects: no Duration of hyperlipidemia: chronic Aspirin: no Recent stressors: no Recurrent headaches: no Visual changes: no Palpitations: no Dyspnea: no Chest pain: no Lower extremity edema: no Dizzy/lightheaded: no  LEG CRAMPS Has had for several years, on and off at night cramps to legs bilaterally. 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 She was told she would have arthritis here in future.  Has had cortisone shots and PT -- does not wish return.   Duration: chronic Location: right Mechanism of injury: trauma Onset: gradual Severity: 5/10  Quality:  dull and aching Frequency: intermittent Radiation: no Aggravating factors: bending and movement  Alleviating factors: physical therapy and rest  Status: stable Treatments attempted: rest, APAP, and physical therapy  Relief with NSAIDs?:  No NSAIDs Taken Swelling: no Redness: no  Warmth: no Trauma: no Chest pain: no  Shortness of breath: no  Fever: no Decreased sensation: no Paresthesias: no Weakness: no   Relevant past medical, surgical, family and social history reviewed and updated as indicated. Interim medical history since our last visit reviewed. Allergies and medications reviewed and updated.  Review of Systems  Constitutional:  Negative for activity change, appetite change, diaphoresis, fatigue and fever.  Respiratory:  Negative for cough, chest tightness and shortness of breath.   Cardiovascular:  Negative for chest pain, palpitations and leg swelling.  Gastrointestinal: Negative.   Musculoskeletal:  Positive for  arthralgias.  Neurological: Negative.   Psychiatric/Behavioral: Negative.     Per HPI unless specifically indicated above     Objective:    BP 137/76 (BP Location: Left Arm)   Pulse 74   Temp 98.3 F (36.8 C) (Oral)   Wt 109 lb 9.6 oz (49.7 kg)   LMP  (LMP Unknown)   SpO2 98%   BMI 21.40 kg/m   Wt Readings from Last 3 Encounters:  08/09/21 109 lb 9.6 oz (49.7 kg)  02/06/21 111 lb (50.3 kg)  11/24/20 110 lb (49.9 kg)     Physical Exam Vitals and nursing note reviewed.  Constitutional:      General: She is awake. She is not in acute distress.    Appearance: She is well-developed and well-groomed. She is not ill-appearing.  HENT:     Head: Normocephalic.     Right Ear: Hearing normal.     Left Ear: Hearing normal.  Eyes:     General: Lids are normal.        Right eye: No discharge.        Left eye: No discharge.     Conjunctiva/sclera: Conjunctivae normal.     Pupils: Pupils are equal, round, and reactive to light.  Neck:     Thyroid: No thyromegaly.     Vascular: No carotid bruit.  Cardiovascular:     Rate and Rhythm: Normal rate and regular rhythm.     Pulses:          Dorsalis pedis pulses are 2+ on the right side and 2+ on the left side.       Posterior tibial pulses are 2+ on the right side and 2+ on the left side.     Heart sounds: Normal heart sounds. No murmur heard.   No gallop.  Pulmonary:     Effort: Pulmonary effort is normal. No accessory muscle usage or respiratory distress.     Breath sounds: Normal breath sounds.  Abdominal:     General: Bowel sounds are normal.     Palpations: Abdomen is soft. There is no hepatomegaly or splenomegaly.  Musculoskeletal:     Right shoulder: Crepitus present. No swelling, laceration, tenderness or bony tenderness. Decreased range of motion. Decreased strength.     Left shoulder: Crepitus present. No swelling, laceration, tenderness or bony tenderness. Decreased range of motion. Decreased strength.     Cervical back: Normal range of motion and neck supple.     Right lower leg: No tenderness or bony tenderness. No edema.     Left lower leg: No tenderness or bony tenderness. No edema.     Comments: Varicosities to bilateral lower legs.  Skin:    General: Skin is warm and dry.     Comments: Scattered pale purple bruises to bilateral upper extremities.  Neurological:     Mental Status: She is alert and oriented to person, place, and time.   Psychiatric:        Attention and Perception: Attention normal.        Mood and Affect: Mood normal.        Speech: Speech normal.        Behavior: Behavior normal. Behavior is cooperative.        Thought Content: Thought content normal.    Results for orders placed or performed in visit on 02/06/21  Comprehensive metabolic panel  Result Value Ref Range   Glucose 133 (H) 65 - 99 mg/dL   BUN 24 10 - 36 mg/dL  Creatinine, Ser 0.94 0.57 - 1.00 mg/dL   eGFR 57 (L) >59 mL/min/1.73   BUN/Creatinine Ratio 26 12 - 28   Sodium 138 134 - 144 mmol/L   Potassium 4.2 3.5 - 5.2 mmol/L   Chloride 100 96 - 106 mmol/L   CO2 22 20 - 29 mmol/L   Calcium 9.4 8.7 - 10.3 mg/dL   Total Protein 7.0 6.0 - 8.5 g/dL   Albumin 3.7 3.5 - 4.6 g/dL   Globulin, Total 3.3 1.5 - 4.5 g/dL   Albumin/Globulin Ratio 1.1 (L) 1.2 - 2.2   Bilirubin Total 0.2 0.0 - 1.2 mg/dL   Alkaline Phosphatase 61 44 - 121 IU/L   AST 16 0 - 40 IU/L   ALT 6 0 - 32 IU/L  TSH  Result Value Ref Range   TSH 3.570 0.450 - 4.500 uIU/mL  CBC with Differential/Platelet  Result Value Ref Range   WBC 5.8 3.4 - 10.8 x10E3/uL   RBC 4.43 3.77 - 5.28 x10E6/uL   Hemoglobin 12.2 11.1 - 15.9 g/dL   Hematocrit 37.9 34.0 - 46.6 %   MCV 86 79 - 97 fL   MCH 27.5 26.6 - 33.0 pg   MCHC 32.2 31.5 - 35.7 g/dL   RDW 13.4 11.7 - 15.4 %   Platelets 260 150 - 450 x10E3/uL   Neutrophils 60 Not Estab. %   Lymphs 28 Not Estab. %   Monocytes 10 Not Estab. %   Eos 1 Not Estab. %   Basos 1 Not Estab. %   Neutrophils Absolute 3.4 1.4 - 7.0 x10E3/uL   Lymphocytes Absolute 1.7 0.7 - 3.1 x10E3/uL   Monocytes Absolute 0.6 0.1 - 0.9 x10E3/uL   EOS (ABSOLUTE) 0.1 0.0 - 0.4 x10E3/uL   Basophils Absolute 0.1 0.0 - 0.2 x10E3/uL   Immature Granulocytes 0 Not Estab. %   Immature Grans (Abs) 0.0 0.0 - 0.1 x10E3/uL  Iron, TIBC and Ferritin Panel  Result Value Ref Range   Total Iron Binding Capacity 314 250 - 450 ug/dL   UIBC 256 118 - 369 ug/dL   Iron 58 27 -  139 ug/dL   Iron Saturation 18 15 - 55 %   Ferritin 62 15 - 150 ng/mL  VITAMIN D 25 Hydroxy (Vit-D Deficiency, Fractures)  Result Value Ref Range   Vit D, 25-Hydroxy 24.3 (L) 30.0 - 100.0 ng/mL  T4, free  Result Value Ref Range   Free T4 0.93 0.82 - 1.77 ng/dL      Assessment & Plan:   Problem List Items Addressed This Visit       Cardiovascular and Mediastinum   Essential hypertension    Chronic, ongoing.  BP at goal for age today on recheck.  Continue current medication regimen and adjust as needed.  BMP and TSH today.  Recommend she monitor BP at home and document + focus on DASH diet.  Return in 6 months.       Senile purpura ([place]) - Primary    Ongoing, stable with no skin breakdown.  Recommend gentle skin care and monitor for wounds, if present then notify provider.        Musculoskeletal and Integument   Osteoarthritis    To multiple locations.  Recommend she start taking Tylenol 1000 MG TID as needed, discussed with patient at length.  May also utilize hear and Voltaren gel for comfort.         Other   Hyperlipidemia    Noted on past labs, due to advanced age  will avoid starting statin.      Vitamin D deficiency    Noted on past labs, with improvement.  Continue supplement daily and check level today.      Relevant Orders   VITAMIN D 25 Hydroxy (Vit-D Deficiency, Fractures)   Iron deficiency anemia due to chronic blood loss    Improved.  Will recheck CBC, iron, ferritin today.  Recommend she continue daily iron supplement and take this with a little orange juice to enhance absorption.  Also recommend increase iron rich foods in diet.  Return in 6 months.      Relevant Orders   Iron, TIBC and Ferritin Panel   CBC with Differential/Platelet   Vitamin B12   Leg cramps, sleep related    Noted at night mainly.  Recommend starting Magnesium 500 MG by mouth at night.  Labs today CBC, iron/ferritin, TSH/Free T4 (had high levels at one point), B12, CMP, and Mag.   Return in 6 months, sooner if worsening.      Relevant Orders   Magnesium   TSH   T4, free   Iron, TIBC and Ferritin Panel   Basic metabolic panel   CBC with Differential/Platelet   Vitamin B12   Other Visit Diagnoses     Flu vaccine need       Flu shot today.   Relevant Orders   Flu Vaccine QUAD High Dose(Fluad) (Completed)        Follow up plan: Return in about 6 months (around 02/06/2022) for HTN/HLD, Vit D, OSTEOARTHRITIS.

## 2021-08-09 NOTE — Assessment & Plan Note (Signed)
Chronic, ongoing.  BP at goal for age today on recheck.  Continue current medication regimen and adjust as needed.  BMP and TSH today.  Recommend she monitor BP at home and document + focus on DASH diet.  Return in 6 months.

## 2021-08-09 NOTE — Assessment & Plan Note (Signed)
Ongoing, stable with no skin breakdown.  Recommend gentle skin care and monitor for wounds, if present then notify provider. 

## 2021-08-09 NOTE — Assessment & Plan Note (Signed)
Noted on past labs, with improvement.  Continue supplement daily and check level today. 

## 2021-08-09 NOTE — Assessment & Plan Note (Signed)
To multiple locations.  Recommend she start taking Tylenol 1000 MG TID as needed, discussed with patient at length.  May also utilize hear and Voltaren gel for comfort.

## 2021-08-10 ENCOUNTER — Telehealth: Payer: Self-pay

## 2021-08-10 LAB — IRON,TIBC AND FERRITIN PANEL
Ferritin: 62 ng/mL (ref 15–150)
Iron Saturation: 12 % — ABNORMAL LOW (ref 15–55)
Iron: 36 ug/dL (ref 27–139)
Total Iron Binding Capacity: 289 ug/dL (ref 250–450)
UIBC: 253 ug/dL (ref 118–369)

## 2021-08-10 LAB — VITAMIN D 25 HYDROXY (VIT D DEFICIENCY, FRACTURES): Vit D, 25-Hydroxy: 39.5 ng/mL (ref 30.0–100.0)

## 2021-08-10 LAB — TSH: TSH: 2.87 u[IU]/mL (ref 0.450–4.500)

## 2021-08-10 LAB — CBC WITH DIFFERENTIAL/PLATELET
Basophils Absolute: 0.1 10*3/uL (ref 0.0–0.2)
Basos: 1 %
EOS (ABSOLUTE): 0.1 10*3/uL (ref 0.0–0.4)
Eos: 2 %
Hematocrit: 37.5 % (ref 34.0–46.6)
Hemoglobin: 12.2 g/dL (ref 11.1–15.9)
Immature Grans (Abs): 0 10*3/uL (ref 0.0–0.1)
Immature Granulocytes: 0 %
Lymphocytes Absolute: 1.7 10*3/uL (ref 0.7–3.1)
Lymphs: 27 %
MCH: 27.9 pg (ref 26.6–33.0)
MCHC: 32.5 g/dL (ref 31.5–35.7)
MCV: 86 fL (ref 79–97)
Monocytes Absolute: 0.7 10*3/uL (ref 0.1–0.9)
Monocytes: 12 %
Neutrophils Absolute: 3.6 10*3/uL (ref 1.4–7.0)
Neutrophils: 58 %
Platelets: 226 10*3/uL (ref 150–450)
RBC: 4.37 x10E6/uL (ref 3.77–5.28)
RDW: 12.6 % (ref 11.7–15.4)
WBC: 6.2 10*3/uL (ref 3.4–10.8)

## 2021-08-10 LAB — BASIC METABOLIC PANEL
BUN/Creatinine Ratio: 21 (ref 12–28)
BUN: 19 mg/dL (ref 10–36)
CO2: 22 mmol/L (ref 20–29)
Calcium: 9.4 mg/dL (ref 8.7–10.3)
Chloride: 102 mmol/L (ref 96–106)
Creatinine, Ser: 0.89 mg/dL (ref 0.57–1.00)
Glucose: 94 mg/dL (ref 70–99)
Potassium: 4.3 mmol/L (ref 3.5–5.2)
Sodium: 138 mmol/L (ref 134–144)
eGFR: 60 mL/min/{1.73_m2} (ref >59)

## 2021-08-10 LAB — MAGNESIUM: Magnesium: 2.3 mg/dL (ref 1.6–2.3)

## 2021-08-10 LAB — T4, FREE: Free T4: 0.94 ng/dL (ref 0.82–1.77)

## 2021-08-10 LAB — VITAMIN B12: Vitamin B-12: 439 pg/mL (ref 232–1245)

## 2021-08-10 NOTE — Telephone Encounter (Signed)
Informed patient of results and recommendations. 

## 2021-08-10 NOTE — Telephone Encounter (Signed)
-----   Message from Marjie Skiff, NP sent at 08/10/2021 12:12 PM EDT ----- Please let Corrie Dandy know all labs have returned and remain stable.  I do recommend she continue her iron supplement daily, as level is normal but on low side of normal.  Would like to maintain this in stable range.  Any questions? Keep being amazing!!  Thank you for allowing me to participate in your care.  I appreciate you. Kindest regards, Jolene

## 2021-08-10 NOTE — Progress Notes (Signed)
Please let [place] know all labs have returned and remain stable.  I do recommend she continue her iron supplement daily, as level is normal but on low side of normal.  Would like to maintain this in stable range.  Any questions? Keep being amazing!!  Thank you for allowing me to participate in your care.  I appreciate you. Kindest regards, [place] Spano

## 2021-08-16 ENCOUNTER — Other Ambulatory Visit: Payer: Self-pay | Admitting: Nurse Practitioner

## 2021-08-16 DIAGNOSIS — I10 Essential (primary) hypertension: Secondary | ICD-10-CM

## 2021-08-16 NOTE — Telephone Encounter (Signed)
Medication Refill - Medication:  benazepril (LOTENSIN) 5 MG tablet   Has the patient contacted their pharmacy? No. (Agent: If no, request that the patient contact the pharmacy for the refill.) (Agent: If yes, when and what did the pharmacy advise?)  Preferred Pharmacy (with phone number or street name):  [facility] - [place], [place] - [address]  [address] [place] [address]  Phone:  [phone]  Fax:  [phone]  Has the patient been seen for an appointment in the last year OR does the patient have an upcoming appointment? Yes.    Agent: Please be advised that RX refills may take up to 3 business days. We ask that you follow-up with your pharmacy.

## 2021-08-16 NOTE — Telephone Encounter (Signed)
[facility] called and spoke to Sue Lush, Pharmacologist about the refill(s) Benazepril requested. Advised it was sent on 02/06/21 #90/4 refill(s). She says it was received and the patient has already received one refill and has 3 left. She says she will go ahead and place the order for a refill.

## 2021-08-23 ENCOUNTER — Ambulatory Visit: Payer: Self-pay | Admitting: *Deleted
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
## 2021-08-23 NOTE — Telephone Encounter (Signed)
Reason for Disposition  [1] MILD longstanding difficulty breathing AND [2]  SAME as normal    States worsening x 6 months  Answer Assessment - Initial Assessment Questions 1. RESPIRATORY STATUS: "Describe your breathing?" (e.g., wheezing, shortness of breath, unable to speak, severe coughing)      SOB worsening x 6 months ago 2. ONSET: "When did this breathing problem begin?"      3 years ago.  3. PATTERN "Does the difficult breathing come and go, or has it been constant since it started?"      Comes and goes 4. SEVERITY: "How bad is your breathing?" (e.g., mild, moderate, severe)    - MILD: No SOB at rest, mild SOB with walking, speaks normally in sentences, can lie down, no retractions, pulse < 100.    - MODERATE: SOB at rest, SOB with minimal exertion and prefers to sit, cannot lie down flat, speaks in phrases, mild retractions, audible wheezing, pulse 100-120.    - SEVERE: Very SOB at rest, speaks in single words, struggling to breathe, sitting hunched forward, retractions, pulse > 120      Mild, with exertion 5. RECURRENT SYMPTOM: "Have you had difficulty breathing before?" If Yes, ask: "When was the last time?" and "What happened that time?"      Yes for 3 years. 6. CARDIAC HISTORY: "Do you have any history of heart disease?" (e.g., heart attack, angina, bypass surgery, angioplasty)       7. LUNG HISTORY: "Do you have any history of lung disease?"  (e.g., pulmonary embolus, asthma, emphysema)      8. CAUSE: "What do you think is causing the breathing problem?"       9. OTHER SYMPTOMS: "Do you have any other symptoms? (e.g., dizziness, runny nose, cough, chest pain, fever)     Allergies, nose runs 10. O2 SATURATION MONITOR:  "Do you use an oxygen saturation monitor (pulse oximeter) at home?" If Yes, "What is your reading (oxygen level) today?" "What is your usual oxygen saturation reading?" (e.g., 95%)       NA  Protocols used: Breathing Difficulty-A-AH

## 2021-08-24 NOTE — Telephone Encounter (Signed)
Called pt to schedule an appt. She stated she is unable to make an appt until she speaks with her daughter or granddaughter. I advised pt to speak with them today to see if they can get her in as soon as they can.

## 2021-11-01 ENCOUNTER — Telehealth: Payer: Self-pay | Admitting: Nurse Practitioner

## 2021-11-01 NOTE — Telephone Encounter (Signed)
Copied from CRM [id number]. Topic: Medicare AWV >> Nov 01, 2021  3:36 PM Leigh Aurora wrote: Reason for CRM:  Left message for patient to call back and schedule the Medicare Annual Wellness Visit (AWV) virtually or by telephone.  Last AWV 08/03/19  Please schedule at anytime with [facility]-Nurse Health Advisor.  45 minute appointment  Any questions, please call me at [phone]

## 2021-12-11 ENCOUNTER — Other Ambulatory Visit: Payer: Self-pay | Admitting: Nurse Practitioner

## 2021-12-11 DIAGNOSIS — I10 Essential (primary) hypertension: Secondary | ICD-10-CM

## 2021-12-11 MED ORDER — BENAZEPRIL HCL 5 MG PO TABS: 5.0000 mg | ORAL_TABLET | Freq: Every day | ORAL | 0 refills | Status: DC

## 2021-12-11 NOTE — Telephone Encounter (Signed)
Requested Prescriptions  Pending Prescriptions Disp Refills   benazepril (LOTENSIN) 5 MG tablet 90 tablet 0    Sig: Take 1 tablet (5 mg total) by mouth daily.     Cardiovascular:  ACE Inhibitors Passed - 12/11/2021  1:35 PM      Passed - Cr in normal range and within 180 days    Creatinine  Date Value Ref Range Status  07/16/2012 0.66 0.60 - 1.30 mg/dL Final   Creatinine, Ser  Date Value Ref Range Status  08/09/2021 0.89 0.57 - 1.00 mg/dL Final         Passed - K in normal range and within 180 days    Potassium  Date Value Ref Range Status  08/09/2021 4.3 3.5 - 5.2 mmol/L Final  07/16/2012 3.9 3.5 - 5.1 mmol/L Final         Passed - Patient is not pregnant      Passed - Last BP in normal range    BP Readings from Last 1 Encounters:  08/09/21 137/76         Passed - Valid encounter within last 6 months    Recent Outpatient Visits          4 months ago Senile purpura ([place])   [name], Jolene T, NP   10 months ago Aortic atherosclerosis ([place])   [place], Barbaraann Faster, NP   1 year ago Vitamin D deficiency   [employer], Megan P, DO   1 year ago Acute blood loss anemia   [place], Barbaraann Faster, NP   1 year ago Rectal bleeding   [place], Barbaraann Faster, NP      Future Appointments            In 1 month [name], Barbaraann Faster, NP [employer]

## 2021-12-11 NOTE — Telephone Encounter (Signed)
Medication: benazepril (LOTENSIN) 5 MG tablet [[id number]]   Has the patient contacted their pharmacy? YES Advised to contact the office (Agent: If no, request that the patient contact the pharmacy for the refill. If patient does not wish to contact the pharmacy document the reason why and proceed with request.) (Agent: If yes, when and what did the pharmacy advise?)  Preferred Pharmacy (with phone number or street name): [id number] - [place], [place] - [address] [place] [address] Phone: [phone] Fax: [phone] Hours: Not open 24 hours   Has the patient been seen for an appointment in the last year OR does the patient have an upcoming appointment? 03/23   Agent: Please be advised that RX refills may take up to 3 business days. We ask that you follow-up with your pharmacy.

## 2022-01-01 ENCOUNTER — Ambulatory Visit (INDEPENDENT_AMBULATORY_CARE_PROVIDER_SITE_OTHER): Payer: Medicare HMO | Admitting: *Deleted

## 2022-01-01 DIAGNOSIS — Z0000 Encounter for general adult medical examination without abnormal findings: Secondary | ICD-10-CM

## 2022-01-01 NOTE — Patient Instructions (Signed)
[name] , Thank you for taking time to come for your Medicare Wellness Visit. I appreciate your ongoing commitment to your health goals. Please review the following plan we discussed and let me know if I can assist you in the future.   Screening recommendations/referrals: Colonoscopy: no longer required Mammogram: no longer required Bone Density: no longer required Recommended yearly ophthalmology/optometry visit for glaucoma screening and checkup Recommended yearly dental visit for hygiene and checkup  Vaccinations: Influenza vaccine: up to date Pneumococcal vaccine: up to date Tdap vaccine: Education provided Shingles vaccine: Education provided    Advanced directives: Education provided  Conditions/risks identified:   Next appointment: 02-06-2022 @ 1:00 8:40   [name]   Preventive Care 65 Years and Older, Female Preventive care refers to lifestyle choices and visits with your health care provider that can promote health and wellness. What does preventive care include? A yearly physical exam. This is also called an annual well check. Dental exams once or twice a year. Routine eye exams. Ask your health care provider how often you should have your eyes checked. Personal lifestyle choices, including: Daily care of your teeth and gums. Regular physical activity. Eating a healthy diet. Avoiding tobacco and drug use. Limiting alcohol use. Practicing safe sex. Taking low-dose aspirin every day. Taking vitamin and mineral supplements as recommended by your health care provider. What happens during an annual well check? The services and screenings done by your health care provider during your annual well check will depend on your age, overall health, lifestyle risk factors, and family history of disease. Counseling  Your health care provider may ask you questions about your: Alcohol use. Tobacco use. Drug use. Emotional well-being. Home and relationship  well-being. Sexual activity. Eating habits. History of falls. Memory and ability to understand (cognition). Work and work Astronomer. Reproductive health. Screening  You may have the following tests or measurements: Height, weight, and BMI. Blood pressure. Lipid and cholesterol levels. These may be checked every 5 years, or more frequently if you are over 86 years old. Skin check. Lung cancer screening. You may have this screening every year starting at age 86 if you have a 30-pack-year history of smoking and currently smoke or have quit within the past 15 years. Fecal occult blood test (FOBT) of the stool. You may have this test every year starting at age 86. Flexible sigmoidoscopy or colonoscopy. You may have a sigmoidoscopy every 5 years or a colonoscopy every 10 years starting at age 86. Hepatitis C blood test. Hepatitis B blood test. Sexually transmitted disease (STD) testing. Diabetes screening. This is done by checking your blood sugar (glucose) after you have not eaten for a while (fasting). You may have this done every 1-3 years. Bone density scan. This is done to screen for osteoporosis. You may have this done starting at age 86. Mammogram. This may be done every 1-2 years. Talk to your health care provider about how often you should have regular mammograms. Talk with your health care provider about your test results, treatment options, and if necessary, the need for more tests. Vaccines  Your health care provider may recommend certain vaccines, such as: Influenza vaccine. This is recommended every year. Tetanus, diphtheria, and acellular pertussis (Tdap, Td) vaccine. You may need a Td booster every 10 years. Zoster vaccine. You may need this after age 76. Pneumococcal 13-valent conjugate (PCV13) vaccine. One dose is recommended after age 86. Pneumococcal polysaccharide (PPSV23) vaccine. One dose is recommended after age 86. Talk to your health  care provider about which  screenings and vaccines you need and how often you need them. This information is not intended to replace advice given to you by your health care provider. Make sure you discuss any questions you have with your health care provider. Document Released: 11/25/2015 Document Revised: 07/18/2016 Document Reviewed: 08/30/2015 Elsevier Interactive Patient Education  2017 [place] Prevention in the Home Falls can cause injuries. They can happen to people of all ages. There are many things you can do to make your home safe and to help prevent falls. What can I do on the outside of my home? Regularly fix the edges of walkways and driveways and fix any cracks. Remove anything that might make you trip as you walk through a door, such as a raised step or threshold. Trim any bushes or trees on the path to your home. Use bright outdoor lighting. Clear any walking paths of anything that might make someone trip, such as rocks or tools. Regularly check to see if handrails are loose or broken. Make sure that both sides of any steps have handrails. Any raised decks and porches should have guardrails on the edges. Have any leaves, snow, or ice cleared regularly. Use sand or salt on walking paths during winter. Clean up any spills in your garage right away. This includes oil or grease spills. What can I do in the bathroom? Use night lights. Install grab bars by the toilet and in the tub and shower. Do not use towel bars as grab bars. Use non-skid mats or decals in the tub or shower. If you need to sit down in the shower, use a plastic, non-slip stool. Keep the floor dry. Clean up any water that spills on the floor as soon as it happens. Remove soap buildup in the tub or shower regularly. Attach bath mats securely with double-sided non-slip rug tape. Do not have throw rugs and other things on the floor that can make you trip. What can I do in the bedroom? Use night lights. Make sure that you have a  light by your bed that is easy to reach. Do not use any sheets or blankets that are too big for your bed. They should not hang down onto the floor. Have a firm chair that has side arms. You can use this for support while you get dressed. Do not have throw rugs and other things on the floor that can make you trip. What can I do in the kitchen? Clean up any spills right away. Avoid walking on wet floors. Keep items that you use a lot in easy-to-reach places. If you need to reach something above you, use a strong step stool that has a grab bar. Keep electrical cords out of the way. Do not use floor polish or wax that makes floors slippery. If you must use wax, use non-skid floor wax. Do not have throw rugs and other things on the floor that can make you trip. What can I do with my stairs? Do not leave any items on the stairs. Make sure that there are handrails on both sides of the stairs and use them. Fix handrails that are broken or loose. Make sure that handrails are as long as the stairways. Check any carpeting to make sure that it is firmly attached to the stairs. Fix any carpet that is loose or worn. Avoid having throw rugs at the top or bottom of the stairs. If you do have throw rugs, attach them to  the floor with carpet tape. Make sure that you have a light switch at the top of the stairs and the bottom of the stairs. If you do not have them, ask someone to add them for you. What else can I do to help prevent falls? Wear shoes that: Do not have high heels. Have rubber bottoms. Are comfortable and fit you well. Are closed at the toe. Do not wear sandals. If you use a stepladder: Make sure that it is fully opened. Do not climb a closed stepladder. Make sure that both sides of the stepladder are locked into place. Ask someone to hold it for you, if possible. Clearly mark and make sure that you can see: Any grab bars or handrails. First and last steps. Where the edge of each step  is. Use tools that help you move around (mobility aids) if they are needed. These include: Canes. Walkers. Scooters. Crutches. Turn on the lights when you go into a dark area. Replace any light bulbs as soon as they burn out. Set up your furniture so you have a clear path. Avoid moving your furniture around. If any of your floors are uneven, fix them. If there are any pets around you, be aware of where they are. Review your medicines with your doctor. Some medicines can make you feel dizzy. This can increase your chance of falling. Ask your doctor what other things that you can do to help prevent falls. This information is not intended to replace advice given to you by your health care provider. Make sure you discuss any questions you have with your health care provider. Document Released: 08/25/2009 Document Revised: 04/05/2016 Document Reviewed: 12/03/2014 Elsevier Interactive Patient Education  2017 [employer].

## 2022-01-01 NOTE — Progress Notes (Signed)
Subjective:   Rhonda Ingram is a 88 y.o. female who presents for Medicare Annual (Subsequent) preventive examination.  I connected with  Tiffeney Kurkowski Urda on 01/01/22 by a telephone enabled telemedicine application and verified that I am speaking with the correct person using two identifiers.   I discussed the limitations of evaluation and management by telemedicine. The patient expressed understanding and agreed to proceed.  Patient location: home  Provider location:  Tele-Health not in office    Review of Systems     Cardiac Risk Factors include: advanced age (>61men, >55 women);sedentary lifestyle     Objective:    Today's Vitals   01/01/22 0859  PainSc: 5    There is no height or weight on file to calculate BMI.  Advanced Directives 01/01/2022 11/24/2020 10/11/2020 09/22/2020 09/21/2020 02/18/2020 02/12/2020  Does Patient Have a Medical Advance Directive? No No No No No No No  Would patient like information on creating a medical advance directive? No - Patient declined - Yes (MAU/Ambulatory/Procedural Areas - Information given) No - Patient declined - No - Patient declined -    Current Medications (verified) Outpatient Encounter Medications as of 01/01/2022  Medication Sig   benazepril (LOTENSIN) 5 MG tablet Take 1 tablet (5 mg total) by mouth daily.   ferrous sulfate 324 MG TBEC Take 324 mg by mouth.   VITAMIN D PO Take by mouth daily.   No facility-administered encounter medications on file as of 01/01/2022.    Allergies (verified) Alpha-d-galactosidase, Meat extract, and Other   History: Past Medical History:  Diagnosis Date   Arthritis    osteoarthritis   Hyperlipidemia    Hypertension    Osteoporosis    Vitamin D deficiency    Past Surgical History:  Procedure Laterality Date   CHOLECYSTECTOMY     ESOPHAGOGASTRODUODENOSCOPY (EGD) WITH PROPOFOL N/A 09/23/2020   Procedure: ESOPHAGOGASTRODUODENOSCOPY (EGD) WITH PROPOFOL;  Surgeon: [name], MD;   Location: [facility] ENDOSCOPY;  Service: Gastroenterology;  Laterality: N/A;   EYE SURGERY     cataract extraction   FLEXIBLE SIGMOIDOSCOPY N/A 09/23/2020   Procedure: FLEXIBLE SIGMOIDOSCOPY;  Surgeon: [name], MD;  Location: [facility] ENDOSCOPY;  Service: Gastroenterology;  Laterality: N/A;   VENTRAL HERNIA REPAIR N/A 10/08/2016   Procedure: HERNIA REPAIR VENTRAL ADULT;  Surgeon: Kieth Brightly, MD;  Location: [facility] ORS;  Service: General;  Laterality: N/A;   Family History  Problem Relation Age of Onset   Hypertension Mother    Stroke Mother    Cancer Father        lung   Hypertension Sister    Hyperlipidemia Sister    Hypertension Daughter    Cerebral palsy Daughter    Hypertension Maternal Grandmother    Hypertension Maternal Grandfather    Hypertension Paternal Grandmother    Hypertension Paternal Grandfather    Hypertension Sister    Hyperlipidemia Sister    Migraines Daughter    Diabetes Brother    Hypertension Brother    Hyperlipidemia Brother    Social History   Socioeconomic History   Marital status: Divorced    Spouse name: Not on file   Number of children: Not on file   Years of education: Not on file   Highest education level: 7th grade  Occupational History   Occupation: retired   Tobacco Use   Smoking status: Never   Smokeless tobacco: Never  Vaping Use   Vaping Use: Never used  Substance and Sexual Activity   Alcohol use: No  Drug use: No   Sexual activity: Never  Other Topics Concern   Not on file  Social History Narrative   Not on file   Social Determinants of Health   Financial Resource Strain: Low Risk    Difficulty of Paying Living Expenses: Not very hard  Food Insecurity: No Food Insecurity   Worried About Running Out of Food in the Last Year: Never true   Ran Out of Food in the Last Year: Never true  Transportation Needs: No Transportation Needs   Lack of Transportation (Medical): No   Lack of Transportation (Non-Medical): No   Physical Activity: Inactive   Days of Exercise per Week: 0 days   Minutes of Exercise per Session: 0 min  Stress: No Stress Concern Present   Feeling of Stress : Only a little  Social Connections: Moderately Isolated   Frequency of Communication with Friends and Family: Three times a week   Frequency of Social Gatherings with Friends and Family: More than three times a week   Attends Religious Services: 1 to 4 times per year   Active Member of [employer] or Organizations: No   Attends Engineer, structural: Never   Marital Status: Divorced    Tobacco Counseling Counseling given: Not Answered   Clinical Intake:  Pre-visit preparation completed: Yes  Pain : 0-10 Pain Score: 5  Pain Type: Chronic pain Pain Location: Leg Pain Orientation: Left Pain Descriptors / Indicators: Constant, Burning, Aching Pain Onset: More than a month ago Pain Frequency: Constant Pain Relieving Factors: tylenol  Pain Relieving Factors: tylenol  Nutritional Risks: None     Diabetic   no         Activities of Daily Living In your present state of health, do you have any difficulty performing the following activities: 01/01/2022  Hearing? Y  Vision? Y  Difficulty concentrating or making decisions? N  Walking or climbing stairs? Y  Dressing or bathing? N  Doing errands, shopping? Y  Preparing Food and eating ? N  Using the Toilet? N  In the past six months, have you accidently leaked urine? N  Do you have problems with loss of bowel control? N  Managing your Medications? N  Managing your Finances? N  Housekeeping or managing your Housekeeping? N  Some recent data might be hidden    Patient Care Team: Marjie Skiff, NP as PCP - General (Nurse Practitioner) Steele Sizer, MD (Family Medicine)  Indicate any recent Medical Services you may have received from other than [facility] providers in the past year (date may be approximate).     Assessment:   This is a routine  wellness examination for [facility].  Hearing/Vision screen Hearing Screening - Comments:: Yes hearing loss No hearing aids Vision Screening - Comments:: Not up to date Unsure of name  Dietary issues and exercise activities discussed: Current Exercise Habits: The patient does not participate in regular exercise at present   Goals Addressed             This Visit's Progress    DIET - INCREASE WATER INTAKE   On track    Recommend drinking at least 6-8 glasses of water a day      Patient Stated       No goals       Depression Screen PHQ 2/9 Scores 01/01/2022 08/09/2021 08/03/2020 08/03/2019 07/30/2018 03/18/2018 08/20/2017  PHQ - 2 Score 0 0 0 0 0 0 0  PHQ- 9 Score - - 4 - - - -  Fall Risk Fall Risk  01/01/2022 08/09/2021 08/03/2020 08/03/2020 08/03/2019  Falls in the past year? 1 1 1 1 1   Number falls in past yr: 0 1 0 0 0  Injury with Fall? 0 0 1 1 0  Risk for fall due to : Impaired balance/gait History of fall(s) Impaired balance/gait;History of fall(s) - -  Follow up Falls evaluation completed;Falls prevention discussed Falls evaluation completed - - -    FALL RISK PREVENTION PERTAINING TO THE HOME:  Any stairs in or around the home? No  If so, are there any without handrails? No  Home free of loose throw rugs in walkways, pet beds, electrical cords, etc? Yes  Adequate lighting in your home to reduce risk of falls? Yes   ASSISTIVE DEVICES UTILIZED TO PREVENT FALLS:  Life alert? No  Use of a cane, walker or w/c? Yes  Grab bars in the bathroom? No  Shower chair or bench in shower? No  Elevated toilet seat or a handicapped toilet? No   TIMED UP AND GO:  Was the test performed? No .    Cognitive Function:       6CIT Screen 01/01/2022 08/03/2019 07/30/2018 07/24/2017  What Year? 0 points 0 points 0 points 0 points  What month? 0 points 0 points 0 points 0 points  What time? 0 points 0 points 0 points 0 points  Count back from 20 0 points 0 points 0 points 0 points   Months in reverse 4 points 0 points 0 points 0 points  Repeat phrase 0 points 2 points 2 points 2 points  Total Score 4 2 2 2     Immunizations Immunization History  Administered Date(s) Administered   Fluad Quad(high Dose 65+) 08/03/2019, 08/03/2020, 08/09/2021   Influenza, High Dose Seasonal PF 08/15/2016, 07/24/2017, 07/30/2018   Influenza,inj,Quad PF,6+ Mos 08/02/2015   Influenza-Unspecified 08/12/2014, 08/23/2014   PFIZER(Purple Top)SARS-COV-2 Vaccination 01/27/2020, 02/17/2020, 10/25/2020   Pneumococcal Conjugate-13 02/14/2016   Pneumococcal Polysaccharide-23 08/16/2008   Td 10/24/2004    TDAP status: Due, Education has been provided regarding the importance of this vaccine. Advised may receive this vaccine at local pharmacy or Health Dept. Aware to provide a copy of the vaccination record if obtained from local pharmacy or Health Dept. Verbalized acceptance and understanding.  Flu Vaccine status: Up to date  Pneumococcal vaccine status: Up to date  Covid-19 vaccine status: Information provided on how to obtain vaccines.   Qualifies for Shingles Vaccine? Yes   Zostavax completed No   Shingrix Completed?: No.    Education has been provided regarding the importance of this vaccine. Patient has been advised to call insurance company to determine out of pocket expense if they have not yet received this vaccine. Advised may also receive vaccine at local pharmacy or Health Dept. Verbalized acceptance and understanding.  Screening Tests Health Maintenance  Topic Date Due   Zoster Vaccines- Shingrix (1 of 2) Never done   TETANUS/TDAP  10/24/2014   COVID-19 Vaccine (4 - Booster for Pfizer series) 12/20/2020   Pneumonia Vaccine 46+ Years old  Completed   INFLUENZA VACCINE  Completed   DEXA SCAN  Completed   HPV VACCINES  Aged Out    Health Maintenance  Health Maintenance Due  Topic Date Due   Zoster Vaccines- Shingrix (1 of 2) Never done   TETANUS/TDAP  10/24/2014    COVID-19 Vaccine (4 - Booster for Pfizer series) 12/20/2020    Colorectal cancer screening: No longer required.   Mammogram status: No longer  required due to age.  Bone Density  no longer required  Lung Cancer Screening: (Low Dose CT Chest recommended if Age 34-80 years, 30 pack-year currently smoking OR have quit w/in 15years.) does not qualify.   Lung Cancer Screening Referral:   Additional Screening:  Hepatitis C Screening: does not qualify;   Vision Screening: Recommended annual ophthalmology exams for early detection of glaucoma and other disorders of the eye. Is the patient up to date with their annual eye exam?  No  Who is the provider or what is the name of the office in which the patient attends annual eye exams?  If pt is not established with a provider, would they like to be referred to a provider to establish care? No .   Dental Screening: Recommended annual dental exams for proper oral hygiene  Community Resource Referral / Chronic Care Management: CRR required this visit?  No   CCM required this visit?  No      Plan:     I have personally reviewed and noted the following in the patients chart:   Medical and social history Use of alcohol, tobacco or illicit drugs  Current medications and supplements including opioid prescriptions.  Functional ability and status Nutritional status Physical activity Advanced directives List of other physicians Hospitalizations, surgeries, and ER visits in previous 12 months Vitals Screenings to include cognitive, depression, and falls Referrals and appointments  In addition, I have reviewed and discussed with patient certain preventive protocols, quality metrics, and best practice recommendations. A written personalized care plan for preventive services as well as general preventive health recommendations were provided to patient.     Remi HaggardJulie Raquan Iannone, LPN   1/61/09602/20/2023   Nurse Notes:

## 2022-02-04 NOTE — Patient Instructions (Addendum)
Take Tylenol 1000 MG three times a day as needed (do not take more then this). ? ?DASH Eating Plan ?DASH stands for Dietary Approaches to Stop Hypertension. The DASH eating plan is a healthy eating plan that has been shown to: ?Reduce high blood pressure (hypertension). ?Reduce your risk for type 2 diabetes, heart disease, and stroke. ?Help with weight loss. ?What are tips for following this plan? ?Reading food labels ?Check food labels for the amount of salt (sodium) per serving. Choose foods with less than 5 percent of the Daily Value of sodium. Generally, foods with less than 300 milligrams (mg) of sodium per serving fit into this eating plan. ?To find whole grains, look for the word "whole" as the first word in the ingredient list. ?Shopping ?Buy products labeled as "low-sodium" or "no salt added." ?Buy fresh foods. Avoid canned foods and pre-made or frozen meals. ?Cooking ?Avoid adding salt when cooking. Use salt-free seasonings or herbs instead of table salt or sea salt. Check with your health care provider or pharmacist before using salt substitutes. ?Do not fry foods. Cook foods using healthy methods such as baking, boiling, grilling, roasting, and broiling instead. ?Cook with heart-healthy oils, such as olive, canola, avocado, soybean, or sunflower oil. ?Meal planning ? ?Eat a balanced diet that includes: ?4 or more servings of fruits and 4 or more servings of vegetables each day. Try to fill one-half of your plate with fruits and vegetables. ?6-8 servings of whole grains each day. ?Less than 6 oz (170 g) of lean meat, poultry, or fish each day. A 3-oz (85-g) serving of meat is about the same size as a deck of cards. One egg equals 1 oz (28 g). ?2-3 servings of low-fat dairy each day. One serving is 1 cup (237 mL). ?1 serving of nuts, seeds, or beans 5 times each week. ?2-3 servings of heart-healthy fats. Healthy fats called omega-3 fatty acids are found in foods such as walnuts, flaxseeds, fortified milks,  and eggs. These fats are also found in cold-water fish, such as sardines, salmon, and mackerel. ?Limit how much you eat of: ?Canned or prepackaged foods. ?Food that is high in trans fat, such as some fried foods. ?Food that is high in saturated fat, such as fatty meat. ?Desserts and other sweets, sugary drinks, and other foods with added sugar. ?Full-fat dairy products. ?Do not salt foods before eating. ?Do not eat more than 4 egg yolks a week. ?Try to eat at least 2 vegetarian meals a week. ?Eat more home-cooked food and less restaurant, buffet, and fast food. ?Lifestyle ?When eating at a restaurant, ask that your food be prepared with less salt or no salt, if possible. ?If you drink alcohol: ?Limit how much you use to: ?0-1 drink a day for women who are not pregnant. ?0-2 drinks a day for men. ?Be aware of how much alcohol is in your drink. In the [place] one drink equals one 12 oz bottle of beer (355 mL), one 5 oz glass of wine (148 mL), or one 1? oz glass of hard liquor (44 mL). ?General information ?Avoid eating more than 2,300 mg of salt a day. If you have hypertension, you may need to reduce your sodium intake to 1,500 mg a day. ?Work with your health care provider to maintain a healthy body weight or to lose weight. Ask what an ideal weight is for you. ?Get at least 30 minutes of exercise that causes your heart to beat faster (aerobic exercise) most days of the  week. Activities may include walking, swimming, or biking. ?Work with your health care provider or dietitian to adjust your eating plan to your individual calorie needs. ?What foods should I eat? ?Fruits ?All fresh, dried, or frozen fruit. Canned fruit in natural juice (without added sugar). ?Vegetables ?Fresh or frozen vegetables (raw, steamed, roasted, or grilled). Low-sodium or reduced-sodium tomato and vegetable juice. Low-sodium or reduced-sodium tomato sauce and tomato paste. Low-sodium or reduced-sodium canned vegetables. ?Grains ?Whole-grain or  whole-wheat bread. Whole-grain or whole-wheat pasta. Brown rice. Orpah Cobb. Bulgur. Whole-grain and low-sodium cereals. Pita bread. Low-fat, low-sodium crackers. Whole-wheat flour tortillas. ?Meats and other proteins ?Skinless chicken or [place]. Ground chicken or [place]. Pork with fat trimmed off. Fish and seafood. Egg whites. Dried beans, peas, or lentils. Unsalted nuts, nut butters, and seeds. Unsalted canned beans. Lean cuts of beef with fat trimmed off. Low-sodium, lean precooked or cured meat, such as sausages or meat loaves. ?Dairy ?Low-fat (1%) or fat-free (skim) milk. Reduced-fat, low-fat, or fat-free cheeses. Nonfat, low-sodium ricotta or cottage cheese. Low-fat or nonfat yogurt. Low-fat, low-sodium cheese. ?Fats and oils ?Soft margarine without trans fats. Vegetable oil. Reduced-fat, low-fat, or light mayonnaise and salad dressings (reduced-sodium). Canola, safflower, olive, avocado, soybean, and sunflower oils. Avocado. ?Seasonings and condiments ?Herbs. Spices. Seasoning mixes without salt. ?Other foods ?Unsalted popcorn and pretzels. Fat-free sweets. ?The items listed above may not be a complete list of foods and beverages you can eat. Contact a dietitian for more information. ?What foods should I avoid? ?Fruits ?Canned fruit in a light or heavy syrup. Fried fruit. Fruit in cream or butter sauce. ?Vegetables ?Creamed or fried vegetables. Vegetables in a cheese sauce. Regular canned vegetables (not low-sodium or reduced-sodium). Regular canned tomato sauce and paste (not low-sodium or reduced-sodium). Regular tomato and vegetable juice (not low-sodium or reduced-sodium). Rosita Fire. Olives. ?Grains ?Baked goods made with fat, such as croissants, muffins, or some breads. Dry pasta or rice meal packs. ?Meats and other proteins ?Fatty cuts of meat. Ribs. Fried meat. Tomasa Blase. Bologna, salami, and other precooked or cured meats, such as sausages or meat loaves. Fat from the back of a pig (fatback).  Bratwurst. Salted nuts and seeds. Canned beans with added salt. Canned or smoked fish. Whole eggs or egg yolks. Chicken or [place] with skin. ?Dairy ?Whole or 2% milk, cream, and half-and-half. Whole or full-fat cream cheese. Whole-fat or sweetened yogurt. Full-fat cheese. Nondairy creamers. Whipped toppings. Processed cheese and cheese spreads. ?Fats and oils ?Butter. Stick margarine. Lard. Shortening. Ghee. Bacon fat. Tropical oils, such as coconut, palm kernel, or palm oil. ?Seasonings and condiments ?Onion salt, garlic salt, seasoned salt, table salt, and sea salt. Worcestershire sauce. Tartar sauce. Barbecue sauce. Teriyaki sauce. Soy sauce, including reduced-sodium. Steak sauce. Canned and packaged gravies. Fish sauce. Oyster sauce. Cocktail sauce. Store-bought horseradish. Ketchup. Mustard. Meat flavorings and tenderizers. Bouillon cubes. Hot sauces. Pre-made or packaged marinades. Pre-made or packaged taco seasonings. Relishes. Regular salad dressings. ?Other foods ?Salted popcorn and pretzels. ?The items listed above may not be a complete list of foods and beverages you should avoid. Contact a dietitian for more information. ?Where to find more information ?National Heart, Lung, and Blood Institute: PopSteam.is ?American Heart Association: www.heart.org ?Academy of Nutrition and Dietetics: www.eatright.org ?National Kidney Foundation: www.kidney.org ?Summary ?The DASH eating plan is a healthy eating plan that has been shown to reduce high blood pressure (hypertension). It may also reduce your risk for type 2 diabetes, heart disease, and stroke. ?When on the DASH eating plan, aim to eat  more fresh fruits and vegetables, whole grains, lean proteins, low-fat dairy, and heart-healthy fats. ?With the DASH eating plan, you should limit salt (sodium) intake to 2,300 mg a day. If you have hypertension, you may need to reduce your sodium intake to 1,500 mg a day. ?Work with your health care provider or  dietitian to adjust your eating plan to your individual calorie needs. ?This information is not intended to replace advice given to you by your health care provider. Make sure you discuss any questions you have with

## 2022-02-06 ENCOUNTER — Other Ambulatory Visit: Payer: Self-pay

## 2022-02-06 ENCOUNTER — Ambulatory Visit (INDEPENDENT_AMBULATORY_CARE_PROVIDER_SITE_OTHER): Payer: Medicare HMO | Admitting: Nurse Practitioner

## 2022-02-06 ENCOUNTER — Encounter: Payer: Self-pay | Admitting: Nurse Practitioner

## 2022-02-06 VITALS — BP 138/72 | HR 74 | Temp 98.0°F | Wt 111.4 lb

## 2022-02-06 DIAGNOSIS — G4762 Sleep related leg cramps: Secondary | ICD-10-CM

## 2022-02-06 DIAGNOSIS — M159 Polyosteoarthritis, unspecified: Secondary | ICD-10-CM

## 2022-02-06 DIAGNOSIS — E559 Vitamin D deficiency, unspecified: Secondary | ICD-10-CM | POA: Diagnosis not present

## 2022-02-06 DIAGNOSIS — D692 Other nonthrombocytopenic purpura: Secondary | ICD-10-CM

## 2022-02-06 DIAGNOSIS — Z9181 History of falling: Secondary | ICD-10-CM

## 2022-02-06 DIAGNOSIS — E782 Mixed hyperlipidemia: Secondary | ICD-10-CM | POA: Diagnosis not present

## 2022-02-06 DIAGNOSIS — I700 Atherosclerosis of aorta: Secondary | ICD-10-CM | POA: Diagnosis not present

## 2022-02-06 DIAGNOSIS — R011 Cardiac murmur, unspecified: Secondary | ICD-10-CM | POA: Diagnosis not present

## 2022-02-06 DIAGNOSIS — I10 Essential (primary) hypertension: Secondary | ICD-10-CM

## 2022-02-06 DIAGNOSIS — D500 Iron deficiency anemia secondary to blood loss (chronic): Secondary | ICD-10-CM | POA: Diagnosis not present

## 2022-02-06 LAB — MICROALBUMIN, URINE WAIVED
Creatinine, Urine Waived: 50 mg/dL (ref 10–300)
Microalb, Ur Waived: 80 mg/L — ABNORMAL HIGH (ref 0–19)

## 2022-02-06 MED ORDER — BENAZEPRIL HCL 5 MG PO TABS: 5.0000 mg | ORAL_TABLET | Freq: Every day | ORAL | 4 refills | Status: DC

## 2022-02-06 NOTE — Assessment & Plan Note (Signed)
Systolic, suspect some mild aortic stenosis based on exam.  At this time no symptoms present, she wishes to hold off on echo.  Will consider this in future to assess further. 

## 2022-02-06 NOTE — Assessment & Plan Note (Signed)
Improved.  Will recheck CBC, iron, ferritin today.  Recommend she continue daily iron supplement and take this with a little orange juice to enhance absorption.  Also recommend increase iron rich foods in diet.  Return in 6 months. ?

## 2022-02-06 NOTE — Assessment & Plan Note (Addendum)
Noted at night mainly with improvement with Magnesium.  Recommend continue Magnesium 400 MG by mouth at night.  Labs today CBC, iron/ferritin, CMP.  Return in 6 months, sooner if worsening. ?

## 2022-02-06 NOTE — Assessment & Plan Note (Signed)
Referral placed for home health to work on strengthening and fall prevention -- PT and OT. ?

## 2022-02-06 NOTE — Assessment & Plan Note (Addendum)
Ongoing, with improvement.  Continue supplement daily and check level today. ?

## 2022-02-06 NOTE — Progress Notes (Signed)
? ?BP 138/72 (BP Location: Left Arm, Patient Position: Sitting, Cuff Size: Normal)   Pulse 74   Temp 98 ?F (36.7 ?C) (Oral)   Wt 111 lb 6.4 oz (50.5 kg)   LMP  (LMP Unknown)   SpO2 98%   BMI 21.76 kg/m?   ? ?Subjective:  ? ? Patient ID: Rhonda Ingram, female    DOB: 29-Apr-1926, 88 y.o.   MRN: [id number] ? ?HPI: ?Rhonda Ingram is a 88 y.o. female ? ?Chief Complaint  ?Patient presents with  ? Hypertension  ? Vitamin D  ? Osteoarthritis  ? Hyperlipidemia  ? Arm Problem  ?  Patient states her arms are getting worse and says the muscles in her arms are causing her not to be able to lift them up.  ? ?Her grand daughter is at bedside to assist with HPI.  Patient continues to live on her own.   ? ?HYPERTENSION / HYPERLIPIDEMIA ?Continues on Benazepril daily.  No statin.   ? ?History of GI bleed in November 2021 -- no further episodes.  Saw Dr. Grayland Ormond with hematology on 11/24/20 with levels showing normal limits, continues on iron supplement.  Vitamin D level history, is taking supplement. ?Satisfied with current treatment? yes ?Duration of hypertension: chronic ?BP monitoring frequency: not checking ?BP range:  ?BP medication side effects: no ?Duration of hyperlipidemia: chronic ?Aspirin: no ?Recent stressors: no ?Recurrent headaches: no ?Visual changes: no ?Palpitations: no ?Dyspnea: no ?Chest pain: no ?Lower extremity edema: no ?Dizzy/lightheaded: no ? ?LEG CRAMPS ?Since starting Magnesium as recommended last visit, this has improved. ?Duration: chronic ?Location:  lower legs ?Bilateral:  yes ?Onset: gradual ?Frequency: intermittent ?Time of  day:   night time ?Sudden unintentional leg jerking:   no ?Paresthesias:   no ?Decreased sensation:  no ?Weakness:   no ?Insomnia:   no ?Fatigue:   no ?Alleviating factors: Shaking them out -- teaspoon of mustard ?Aggravating factors: none ?Status: fluctuating ?Treatments attempted: mustard ? 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 She was told she would have arthritis to them in future.  Takes Tylenol every morning, 500 MG.   ? ?Has had cortisone shots and PT -- does not wish return as feels when she went to PT in past this caused more issues, arm got worse after this.  Got cortisone shots every year, but none recent. ? ?Does endorse being off balance and occasionally feeling like she may fall.  Has a stick she walks with.  Lives in her daughters house and has some floors rotting in home.   ?Duration: chronic ?Location: right ?Mechanism of injury: trauma ?Onset: gradual ?Severity: 4-5/10 ?Quality:  dull and aching ?Frequency: intermittent ?Radiation: no ?Aggravating factors: bending and movement  ?Alleviating factors: physical therapy and rest  ?Status: stable ?Treatments attempted: rest, APAP, and physical therapy  ?Relief with NSAIDs?:  No NSAIDs Taken ?Swelling: no ?Redness: no  ?Warmth: no ?Trauma: no ?Chest pain: no  ?Shortness of breath: no  ?Fever: no ?Decreased sensation: no ?Paresthesias: no ?Weakness: no  ? ?Relevant past medical, surgical, family and social history reviewed and updated as indicated. Interim medical history since our last visit reviewed. ?Allergies and medications reviewed and updated. ? ?Review of Systems  ?Constitutional:  Negative for activity change, appetite change, diaphoresis, fatigue and fever.  ?Respiratory:  Negative for cough, chest tightness and shortness of breath.   ?Cardiovascular:  Negative for chest pain, palpitations and leg swelling.  ?Gastrointestinal: Negative.   ?  Musculoskeletal:  Positive for arthralgias.  ?Neurological: Negative.   ?Psychiatric/Behavioral: Negative.    ? ?Per HPI unless specifically indicated above ? ?   ?Objective:  ?  ?BP 138/72 (BP Location: Left Arm, Patient Position: Sitting, Cuff Size: Normal)   Pulse 74   Temp 98 ?F (36.7 ?C) (Oral)   Wt 111 lb 6.4 oz (50.5 kg)   LMP  (LMP Unknown)   SpO2 98%   BMI 21.76 kg/m?   ?Wt Readings from Last  3 Encounters:  ?02/06/22 111 lb 6.4 oz (50.5 kg)  ?08/09/21 109 lb 9.6 oz (49.7 kg)  ?02/06/21 111 lb (50.3 kg)  ?  ?Physical Exam ?Vitals and nursing note reviewed.  ?Constitutional:   ?   General: She is awake. She is not in acute distress. ?   Appearance: She is well-developed and well-groomed. She is not ill-appearing.  ?HENT:  ?   Head: Normocephalic.  ?   Right Ear: Hearing normal.  ?   Left Ear: Hearing normal.  ?Eyes:  ?   General: Lids are normal.     ?   Right eye: No discharge.     ?   Left eye: No discharge.  ?   Conjunctiva/sclera: Conjunctivae normal.  ?   Pupils: Pupils are equal, round, and reactive to light.  ?Neck:  ?   Thyroid: No thyromegaly.  ?   Vascular: No carotid bruit.  ?Cardiovascular:  ?   Rate and Rhythm: Normal rate and regular rhythm.  ?   Pulses:     ?     Dorsalis pedis pulses are 2+ on the right side and 2+ on the left side.  ?     Posterior tibial pulses are 2+ on the right side and 2+ on the left side.  ?   Heart sounds: Murmur heard.  ?  No gallop.  ?Pulmonary:  ?   Effort: Pulmonary effort is normal. No accessory muscle usage or respiratory distress.  ?   Breath sounds: Normal breath sounds.  ?Abdominal:  ?   General: Bowel sounds are normal.  ?   Palpations: Abdomen is soft. There is no hepatomegaly or splenomegaly.  ?Musculoskeletal:  ?   Right shoulder: Crepitus present. No swelling, laceration, tenderness or bony tenderness. Decreased range of motion. Decreased strength.  ?   Left shoulder: Crepitus present. No swelling, laceration, tenderness or bony tenderness. Decreased range of motion. Decreased strength.  ?   Cervical back: Normal range of motion and neck supple.  ?   Right lower leg: No tenderness or bony tenderness. No edema.  ?   Left lower leg: No tenderness or bony tenderness. No edema.  ?   Comments: Varicosities to bilateral lower legs.  ?Skin: ?   General: Skin is warm and dry.  ?   Comments: Scattered pale purple bruises to bilateral upper extremities.   ?Neurological:  ?   Mental Status: She is alert and oriented to person, place, and time.  ?Psychiatric:     ?   Attention and Perception: Attention normal.     ?   Mood and Affect: Mood normal.     ?   Speech: Speech normal.     ?   Behavior: Behavior normal. Behavior is cooperative.     ?   Thought Content: Thought content normal.  ? ? ?Results for orders placed or performed in visit on 08/09/21  ?Magnesium  ?Result Value Ref Range  ? Magnesium 2.3 1.6 - 2.3 mg/dL  ?TSH  ?  Result Value Ref Range  ? TSH 2.870 0.450 - 4.500 uIU/mL  ?T4, free  ?Result Value Ref Range  ? Free T4 0.94 0.82 - 1.77 ng/dL  ?Iron, TIBC and Ferritin Panel  ?Result Value Ref Range  ? Total Iron Binding Capacity 289 250 - 450 ug/dL  ? UIBC 253 118 - 369 ug/dL  ? Iron 36 27 - 139 ug/dL  ? Iron Saturation 12 (L) 15 - 55 %  ? Ferritin 62 15 - 150 ng/mL  ?Basic metabolic panel  ?Result Value Ref Range  ? Glucose 94 70 - 99 mg/dL  ? BUN 19 10 - 36 mg/dL  ? Creatinine, Ser 0.89 0.57 - 1.00 mg/dL  ? eGFR 60 >59 mL/min/1.73  ? BUN/Creatinine Ratio 21 12 - 28  ? Sodium 138 134 - 144 mmol/L  ? Potassium 4.3 3.5 - 5.2 mmol/L  ? Chloride 102 96 - 106 mmol/L  ? CO2 22 20 - 29 mmol/L  ? Calcium 9.4 8.7 - 10.3 mg/dL  ?CBC with Differential/Platelet  ?Result Value Ref Range  ? WBC 6.2 3.4 - 10.8 x10E3/uL  ? RBC 4.37 3.77 - 5.28 x10E6/uL  ? Hemoglobin 12.2 11.1 - 15.9 g/dL  ? Hematocrit 37.5 34.0 - 46.6 %  ? MCV 86 79 - 97 fL  ? MCH 27.9 26.6 - 33.0 pg  ? MCHC 32.5 31.5 - 35.7 g/dL  ? RDW 12.6 11.7 - 15.4 %  ? Platelets 226 150 - 450 x10E3/uL  ? Neutrophils 58 Not Estab. %  ? Lymphs 27 Not Estab. %  ? Monocytes 12 Not Estab. %  ? Eos 2 Not Estab. %  ? Basos 1 Not Estab. %  ? Neutrophils Absolute 3.6 1.4 - 7.0 x10E3/uL  ? Lymphocytes Absolute 1.7 0.7 - 3.1 x10E3/uL  ? Monocytes Absolute 0.7 0.1 - 0.9 x10E3/uL  ? EOS (ABSOLUTE) 0.1 0.0 - 0.4 x10E3/uL  ? Basophils Absolute 0.1 0.0 - 0.2 x10E3/uL  ? Immature Granulocytes 0 Not Estab. %  ? Immature Grans (Abs) 0.0  0.0 - 0.1 x10E3/uL  ?VITAMIN D 25 Hydroxy (Vit-D Deficiency, Fractures)  ?Result Value Ref Range  ? Vit D, 25-Hydroxy 39.5 30.0 - 100.0 ng/mL  ?Vitamin B12  ?Result Value Ref Range  ? Vitamin B-12 439 232 - 1,245 pg/mL  ? ?

## 2022-02-06 NOTE — Assessment & Plan Note (Addendum)
To multiple locations, mainly to shoulder at this time.  Recommend she start taking Tylenol 1000 MG TID as needed, discussed with patient at length.  May also utilize hear and Voltaren gel for comfort.  Referral to return to ortho, as benefited from cortisone shots in past.  Home health for PT and OT ordered to assist with fall prevention and pain. ?

## 2022-02-06 NOTE — Assessment & Plan Note (Signed)
Noted on past labs, due to advanced age will avoid starting statin. 

## 2022-02-06 NOTE — Assessment & Plan Note (Signed)
Ongoing, stable with no skin breakdown.  Recommend gentle skin care and monitor for wounds, if present then notify provider. 

## 2022-02-06 NOTE — Assessment & Plan Note (Signed)
Chronic, ongoing.  BP at goal for age today on recheck.  Continue current medication regimen and adjust as needed.  CBC and CMP + urine ALB today.  Recommend she monitor BP at home and document + focus on DASH diet.  Return in 6 months. ? ?

## 2022-02-06 NOTE — Assessment & Plan Note (Signed)
Noted on imaging 09/21/20, at this time will monitor and avoid restart of ASA + statin.  History of GI bleed and advanced age. 

## 2022-02-07 LAB — COMPREHENSIVE METABOLIC PANEL
ALT: 5 IU/L (ref 0–32)
AST: 12 IU/L (ref 0–40)
Albumin/Globulin Ratio: 1.1 — ABNORMAL LOW (ref 1.2–2.2)
Albumin: 3.7 g/dL (ref 3.5–4.6)
Alkaline Phosphatase: 64 IU/L (ref 44–121)
BUN/Creatinine Ratio: 17 (ref 12–28)
BUN: 15 mg/dL (ref 10–36)
Bilirubin Total: 0.3 mg/dL (ref 0.0–1.2)
CO2: 26 mmol/L (ref 20–29)
Calcium: 9.1 mg/dL (ref 8.7–10.3)
Chloride: 105 mmol/L (ref 96–106)
Creatinine, Ser: 0.86 mg/dL (ref 0.57–1.00)
Globulin, Total: 3.3 g/dL (ref 1.5–4.5)
Glucose: 110 mg/dL — ABNORMAL HIGH (ref 70–99)
Potassium: 3.9 mmol/L (ref 3.5–5.2)
Sodium: 143 mmol/L (ref 134–144)
Total Protein: 7 g/dL (ref 6.0–8.5)
eGFR: 63 mL/min/{1.73_m2} (ref >59)

## 2022-02-07 LAB — FERRITIN: Ferritin: 42 ng/mL (ref 15–150)

## 2022-02-07 LAB — CBC WITH DIFFERENTIAL/PLATELET
Basophils Absolute: 0.1 10*3/uL (ref 0.0–0.2)
Basos: 1 %
EOS (ABSOLUTE): 0.1 10*3/uL (ref 0.0–0.4)
Eos: 1 %
Hematocrit: 36.6 % (ref 34.0–46.6)
Hemoglobin: 11.7 g/dL (ref 11.1–15.9)
Immature Grans (Abs): 0 10*3/uL (ref 0.0–0.1)
Immature Granulocytes: 0 %
Lymphocytes Absolute: 1.6 10*3/uL (ref 0.7–3.1)
Lymphs: 28 %
MCH: 27.9 pg (ref 26.6–33.0)
MCHC: 32 g/dL (ref 31.5–35.7)
MCV: 87 fL (ref 79–97)
Monocytes Absolute: 0.6 10*3/uL (ref 0.1–0.9)
Monocytes: 11 %
Neutrophils Absolute: 3.4 10*3/uL (ref 1.4–7.0)
Neutrophils: 59 %
Platelets: 238 10*3/uL (ref 150–450)
RBC: 4.19 x10E6/uL (ref 3.77–5.28)
RDW: 12.7 % (ref 11.7–15.4)
WBC: 5.9 10*3/uL (ref 3.4–10.8)

## 2022-02-07 LAB — IRON AND TIBC
Iron Saturation: 23 % (ref 15–55)
Iron: 67 ug/dL (ref 27–139)
Total Iron Binding Capacity: 293 ug/dL (ref 250–450)
UIBC: 226 ug/dL (ref 118–369)

## 2022-02-07 LAB — VITAMIN D 25 HYDROXY (VIT D DEFICIENCY, FRACTURES): Vit D, 25-Hydroxy: 33.6 ng/mL (ref 30.0–100.0)

## 2022-02-07 NOTE — Progress Notes (Signed)
Good morning crew, please let Hira know her know her labs have returned and remain overall stable.  In fact kidney function is fantastic!!  Continue all current medications and we will adjust as needed in future.  Any questions? ?Keep being amazing!!  Thank you for allowing me to participate in your care.  I appreciate you. ?Kindest regards, ?Shamara Soza ?

## 2022-02-08 ENCOUNTER — Telehealth: Payer: Self-pay | Admitting: Nurse Practitioner

## 2022-02-08 NOTE — Telephone Encounter (Signed)
Home Health Verbal Orders - Caller/Agency: [facility]  ?Callback Number: [phone] ?Requesting OT/PT/Skilled Nursing/Social Work/Speech Therapy: PT  ?Frequency: ?1w1 ?2w2 ?1w2 ? ?

## 2022-02-09 ENCOUNTER — Telehealth: Payer: Self-pay | Admitting: Nurse Practitioner

## 2022-02-09 NOTE — Telephone Encounter (Signed)
Home Health Verbal Orders - Caller/Agency: [facility] ?Callback Number: [phone] ?Requesting OT ?Frequency: 1 wk 1 ?2 wk 3 ? ?

## 2022-02-09 NOTE — Telephone Encounter (Signed)
Called and gave verbal orders per Jolene.  

## 2022-02-09 NOTE — Telephone Encounter (Signed)
Called and LVM giving verbal orders per Jolene.  ?

## 2022-02-12 ENCOUNTER — Telehealth: Payer: Self-pay | Admitting: Nurse Practitioner

## 2022-02-12 NOTE — Telephone Encounter (Signed)
Called and gave verbal order per Jolene.  °

## 2022-02-12 NOTE — Telephone Encounter (Signed)
Copied from CRM [id number]. Topic: Quick Communication - Home Health Verbal Orders ?>> Feb 12, 2022  8:32 AM Gaetana Michaelis A wrote: ?Caller/Agency: Judeth Cornfield / Rolene Arbour  ?Callback Number: [phone] ?Requesting OT/PT/Skilled Nursing/Social Work/Speech Therapy: Social Work   ?Frequency: Consult - for APS referral for house/living conditions ?

## 2022-02-15 ENCOUNTER — Telehealth: Payer: Self-pay | Admitting: Nurse Practitioner

## 2022-02-15 DIAGNOSIS — Z9181 History of falling: Secondary | ICD-10-CM

## 2022-02-15 NOTE — Telephone Encounter (Signed)
Ordered printed along with patient insurance demographics. Placed in provider's folder for signature.  ?

## 2022-02-15 NOTE — Telephone Encounter (Signed)
Copied from CRM [id number]. Topic: General - Other ?>> Feb 15, 2022  1:28 PM Traci Sermon wrote: ?Reason for CRM: Kendal Hymen from [facility] called in with the pt requesting if PCP could fax over a order for a Straight Cane for the pt at fax: [phone], if PCP has any question Kendal Hymen can be reached at [phone]. Please advise. ?

## 2022-02-16 NOTE — Telephone Encounter (Signed)
Paperwork was faxed over to [facility] [phone]. ?

## 2022-03-06 ENCOUNTER — Other Ambulatory Visit (INDEPENDENT_AMBULATORY_CARE_PROVIDER_SITE_OTHER): Payer: Medicare HMO | Admitting: Family Medicine

## 2022-03-06 DIAGNOSIS — I700 Atherosclerosis of aorta: Secondary | ICD-10-CM

## 2022-03-06 DIAGNOSIS — Z9181 History of falling: Secondary | ICD-10-CM

## 2022-03-06 DIAGNOSIS — E782 Mixed hyperlipidemia: Secondary | ICD-10-CM

## 2022-03-06 DIAGNOSIS — M159 Polyosteoarthritis, unspecified: Secondary | ICD-10-CM

## 2022-03-06 DIAGNOSIS — D500 Iron deficiency anemia secondary to blood loss (chronic): Secondary | ICD-10-CM

## 2022-03-06 DIAGNOSIS — E559 Vitamin D deficiency, unspecified: Secondary | ICD-10-CM

## 2022-03-06 DIAGNOSIS — I10 Essential (primary) hypertension: Secondary | ICD-10-CM

## 2022-03-06 NOTE — Progress Notes (Signed)
Received home health orders orders from [facility]. ?Start of care 02/08/22.   Certification and orders from 02/08/22 through 04/08/22 are reviewed, signed and faxed back to home health company. ? ?Need of intermittent skilled services at home: Medical social services for evaluation of the patient's social and emotional factors to adjust to care and foster independent living. PT for strengthening and fall prevention. Skilled nursing for medication management.  ? ?The home health care plan has been established by me and will be reviewed and updated as needed to maximize patient recovery.  I certify that all home health services have been and will be furnished to the patient while under my care. ? ?Face-to-face encounter in which the need for home health services was established: 02/06/22  ? ?Patient is receiving home health services for the following diagnoses: ?Problem List Items Addressed This Visit   ? ?  ? Cardiovascular and Mediastinum  ? Essential hypertension  ? Aortic atherosclerosis (HCC)  ?  ? Musculoskeletal and Integument  ? Osteoarthritis - Primary  ?  ? Other  ? Hyperlipidemia  ? Vitamin D deficiency  ? Iron deficiency anemia due to chronic blood loss  ? ?Other Visit Diagnoses   ? ? Personal history of fall      ? ?  ? ? ?Olevia Perches, DO  ? ?

## 2022-03-08 ENCOUNTER — Other Ambulatory Visit: Payer: Self-pay | Admitting: Nurse Practitioner

## 2022-03-08 DIAGNOSIS — I10 Essential (primary) hypertension: Secondary | ICD-10-CM

## 2022-03-08 NOTE — Telephone Encounter (Signed)
Requested Prescriptions  ?Pending Prescriptions Disp Refills  ?? benazepril (LOTENSIN) 5 MG tablet [Pharmacy Med Name: BENAZEPRIL HCL 5 MG TABLET] 90 tablet 4  ?  Sig: TAKE 1 TABLET (5 MG TOTAL) BY MOUTH DAILY.  ?  ? Cardiovascular:  ACE Inhibitors Passed - 03/08/2022  3:07 AM  ?  ?  Passed - Cr in normal range and within 180 days  ?  Creatinine  ?Date Value Ref Range Status  ?07/16/2012 0.66 0.60 - 1.30 mg/dL Final  ? ?Creatinine, Ser  ?Date Value Ref Range Status  ?02/06/2022 0.86 0.57 - 1.00 mg/dL Final  ?   ?  ?  Passed - K in normal range and within 180 days  ?  Potassium  ?Date Value Ref Range Status  ?02/06/2022 3.9 3.5 - 5.2 mmol/L Final  ?07/16/2012 3.9 3.5 - 5.1 mmol/L Final  ?   ?  ?  Passed - Patient is not pregnant  ?  ?  Passed - Last BP in normal range  ?  BP Readings from Last 1 Encounters:  ?02/06/22 138/72  ?   ?  ?  Passed - Valid encounter within last 6 months  ?  Recent Outpatient Visits   ?      ? 1 month ago Aortic atherosclerosis (HCC)  ? [facility] [place], Maxwell T, NP  ? 7 months ago Senile purpura (HCC)  ? [facility] [place], Corrie Dandy T, NP  ? 1 year ago Aortic atherosclerosis (HCC)  ? [facility] [place], Corrie Dandy T, NP  ? 1 year ago Vitamin D deficiency  ? [facility] [place], [place] P, DO  ? 1 year ago Acute blood loss anemia  ? [facility] [place], Corrie Dandy T, NP  ?  ?  ?Future Appointments   ?        ? In 5 months [name], Dorie Rank, NP [facility]  ?  ? ?  ?  ?  ? ? ?

## 2022-03-15 ENCOUNTER — Telehealth: Payer: Self-pay | Admitting: *Deleted

## 2022-03-15 NOTE — Telephone Encounter (Signed)
Transition Care Management Unsuccessful Follow-up Telephone Call ? ?Date of discharge and from where:  4-26 ? ?Attempts:  2nd Attempt ? ?Reason for unsuccessful TCM follow-up call:  No answer/busy ? ?  ?

## 2022-06-06 ENCOUNTER — Ambulatory Visit: Payer: Self-pay

## 2022-06-06 MED ORDER — TRIAMCINOLONE ACETONIDE 0.1 % EX CREA: 1.0000 | TOPICAL_CREAM | Freq: Two times a day (BID) | CUTANEOUS | 0 refills | Status: DC

## 2022-06-06 NOTE — Addendum Note (Signed)
Addended by: Aura Dials T on: 06/06/2022 05:06 PM   Modules accepted: Orders

## 2022-06-06 NOTE — Telephone Encounter (Signed)
  Chief Complaint: itching Symptoms: itching all over, under arms, chest, breast, back Frequency: several weeks Pertinent Negatives: NA Disposition: [] ED /[] Urgent Care (no appt availability in office) / [] Appointment(In office/virtual)/ []  [facility]/ [] Home Care/ [] Refused Recommended Disposition /[] [facility]/ [x]  Follow-up with PCP Additional Notes: pt is calling to see if a cream can be prescribed and sent to mail pharmacy. She felt like she had been prescribed something previously but unable to find it or remember the name. Pt doesn't have a way to get around and family works all the time. Advised pt I would send message to provider to see if something can be prescribed or not.   Summary: medication question/concern   Pt stated she found out that she is allergic to hamburgers but she does not know what else she could possibly be allergic to. Pt stated she recently had a spell of being itchy all over her body and she would like for a nurse to return her call to advise what medication she was previously prescribed. Cb# [phone]      Reason for Disposition  [1] Widespread itching AND [2] cause unknown AND [3] present > 48 hours  (Exception: Caller knows the cause and can eliminate it.)  Answer Assessment - Initial Assessment Questions 1. DESCRIPTION: "Describe the itching you are having."     Itching under arms chest and breast and back  2. SEVERITY: "How bad is it?"    - MILD: Doesn't interfere with normal activities.   - MODERATE-SEVERE: Interferes with work, school, sleep, or other activities.      Mild to moderate  4. ONSET: "When did this begin?"      Several weeks  5. CAUSE: "What do you think is causing the itching?" (ask about swimming pools, pollen, animals, soaps, etc.)     Unsure what the cause is.  Protocols used: Itching - Widespread-A-AH

## 2022-06-07 NOTE — Telephone Encounter (Signed)
Noted  

## 2022-06-07 NOTE — Telephone Encounter (Signed)
Spoke with patient daughter Rhonda Ingram and says that patient has a little bit that she was able to squeeze the last bit of the tube to help with itchiness. Patient daughter says the rash has cleared up and she did not see a rash and patient does not need the appointment and says she thinks the patient was just itching that's all.

## 2022-08-10 ENCOUNTER — Ambulatory Visit: Payer: Medicare HMO | Admitting: Nurse Practitioner

## 2022-08-10 DIAGNOSIS — R011 Cardiac murmur, unspecified: Secondary | ICD-10-CM

## 2022-08-10 DIAGNOSIS — E782 Mixed hyperlipidemia: Secondary | ICD-10-CM

## 2022-08-10 DIAGNOSIS — D692 Other nonthrombocytopenic purpura: Secondary | ICD-10-CM

## 2022-08-10 DIAGNOSIS — I10 Essential (primary) hypertension: Secondary | ICD-10-CM

## 2022-08-15 ENCOUNTER — Ambulatory Visit: Payer: Medicare HMO | Admitting: Nurse Practitioner

## 2022-08-21 ENCOUNTER — Ambulatory Visit: Payer: Medicare HMO | Admitting: Nurse Practitioner

## 2022-08-21 DIAGNOSIS — D692 Other nonthrombocytopenic purpura: Secondary | ICD-10-CM

## 2022-08-21 DIAGNOSIS — D500 Iron deficiency anemia secondary to blood loss (chronic): Secondary | ICD-10-CM

## 2022-08-21 DIAGNOSIS — R011 Cardiac murmur, unspecified: Secondary | ICD-10-CM

## 2022-08-21 DIAGNOSIS — I700 Atherosclerosis of aorta: Secondary | ICD-10-CM

## 2022-08-21 DIAGNOSIS — M159 Polyosteoarthritis, unspecified: Secondary | ICD-10-CM

## 2022-08-21 DIAGNOSIS — E782 Mixed hyperlipidemia: Secondary | ICD-10-CM

## 2022-08-21 DIAGNOSIS — I10 Essential (primary) hypertension: Secondary | ICD-10-CM

## 2022-08-26 NOTE — Patient Instructions (Signed)

## 2022-08-28 ENCOUNTER — Encounter: Payer: Self-pay | Admitting: Nurse Practitioner

## 2022-08-28 ENCOUNTER — Ambulatory Visit (INDEPENDENT_AMBULATORY_CARE_PROVIDER_SITE_OTHER): Payer: Medicare HMO | Admitting: Nurse Practitioner

## 2022-08-28 VITALS — BP 136/76 | HR 76 | Temp 98.4°F | Wt 106.3 lb

## 2022-08-28 DIAGNOSIS — I700 Atherosclerosis of aorta: Secondary | ICD-10-CM

## 2022-08-28 DIAGNOSIS — E782 Mixed hyperlipidemia: Secondary | ICD-10-CM | POA: Diagnosis not present

## 2022-08-28 DIAGNOSIS — Z23 Encounter for immunization: Secondary | ICD-10-CM | POA: Diagnosis not present

## 2022-08-28 DIAGNOSIS — R011 Cardiac murmur, unspecified: Secondary | ICD-10-CM | POA: Diagnosis not present

## 2022-08-28 DIAGNOSIS — I10 Essential (primary) hypertension: Secondary | ICD-10-CM | POA: Diagnosis not present

## 2022-08-28 DIAGNOSIS — D692 Other nonthrombocytopenic purpura: Secondary | ICD-10-CM

## 2022-08-28 DIAGNOSIS — G4762 Sleep related leg cramps: Secondary | ICD-10-CM

## 2022-08-28 MED ORDER — BENAZEPRIL HCL 5 MG PO TABS: 5.0000 mg | ORAL_TABLET | Freq: Every day | ORAL | 4 refills | Status: DC

## 2022-08-28 NOTE — Assessment & Plan Note (Signed)
Chronic.  Noted on imaging 09/21/20, at this time will monitor and avoid restart of ASA + statin.  History of GI bleed and advanced age.

## 2022-08-28 NOTE — Assessment & Plan Note (Signed)
Systolic, suspect some mild aortic stenosis based on exam.  At this time no symptoms present, she wishes to hold off on echo.  Will consider this in future to assess further.

## 2022-08-28 NOTE — Assessment & Plan Note (Signed)
Stable, improvement with Magnesium and iron.  Recommend continue Magnesium 400 MG by mouth at night.  Labs next visit, only BMP today.  Return in 6 months, sooner if worsening.

## 2022-08-28 NOTE — Assessment & Plan Note (Signed)
Noted on past labs, due to advanced age will avoid starting statin.  She prefers not to take more medication.

## 2022-08-28 NOTE — Assessment & Plan Note (Signed)
Chronic, stable.  BP at goal for age today on recheck.  Continue current medication regimen and adjust as needed.  LABS: BMP and TSH today.  Recommend she monitor BP at home and document + focus on DASH diet.  Return in 6 months.

## 2022-08-28 NOTE — Progress Notes (Signed)
BP 136/76 (BP Location: Left Arm, Patient Position: Sitting, Cuff Size: Normal)   Pulse 76   Temp 98.4 F (36.9 C) (Oral)   Wt 106 lb 4.8 oz (48.2 kg)   LMP  (LMP Unknown)   SpO2 99%   BMI 20.76 kg/m    Subjective:    Patient ID: Rhonda Ingram, female    DOB: 11/04/1926, 88 y.o.   MRN: [id number]  HPI: Rhonda Ingram is a 88 y.o. female  Chief Complaint  Patient presents with   Hypertension   OA   Hyperlipidemia   Anemia   Her grand daughter is at bedside to assist with HPI.  Patient continues to live on her own.    HYPERTENSION / HYPERLIPIDEMIA Continues on Benazepril daily.  No statin.  Vitamin D level history, is taking supplement. Satisfied with current treatment? yes Duration of hypertension: chronic BP monitoring frequency: not checking BP range:  BP medication side effects: no Duration of hyperlipidemia: chronic Aspirin: no Recent stressors: no Recurrent headaches: no Visual changes: no Palpitations: no Dyspnea: occasional with bending over and putting socks on Chest pain: no Lower extremity edema: no Dizzy/lightheaded: no  LEG CRAMPS Continues Magnesium and Iron.   Duration: chronic Location:  lower legs Bilateral:  yes Onset: gradual Frequency: intermittent Time of  day:   night time Sudden unintentional leg jerking:   no Paresthesias:   no Decreased sensation:  no Weakness:   no Insomnia:   no Fatigue:   no Alleviating factors: Shaking them out -- teaspoon of mustard Aggravating factors: none Status: fluctuating Treatments attempted: mustard   She reports living by herself with 6-8 cats and states it is normal for her to feel down at times, refuses medications.    08/28/2022    2:45 PM 02/06/2022    1:11 PM 01/01/2022    9:21 AM 08/09/2021    1:29 PM 08/03/2020   12:13 PM  Depression screen PHQ 2/9  Decreased Interest 1 0 0 0 0  Down, Depressed, Hopeless 1 0 0 0 0  PHQ - 2 Score 2 0 0 0 0  Altered sleeping 1 0   1  Tired,  decreased energy '3 3   3  ' Change in appetite 0 0   0  Feeling bad or failure about yourself  0 0   0  Trouble concentrating 2 0   0  Moving slowly or fidgety/restless 2 0   0  Suicidal thoughts 0 0   0  PHQ-9 Score '10 3   4  ' Difficult doing work/chores Somewhat difficult    Not difficult at all       08/28/2022    2:46 PM 02/06/2022    1:11 PM  GAD 7 : Generalized Anxiety Score  Nervous, Anxious, on Edge 1 0  Control/stop worrying 1 0  Worry too much - different things 2 0  Trouble relaxing 0 0  Restless 0 0  Easily annoyed or irritable 1 0  Afraid - awful might happen 1 0  Total GAD 7 Score 6 0  Anxiety Difficulty Somewhat difficult Not difficult at all     Relevant past medical, surgical, family and social history reviewed and updated as indicated. Interim medical history since our last visit reviewed. Allergies and medications reviewed and updated.  Review of Systems  Constitutional:  Negative for activity change, appetite change, diaphoresis, fatigue and fever.  Respiratory:  Negative for cough, chest tightness and shortness of breath.   Cardiovascular:  Negative  for chest pain, palpitations and leg swelling.  Gastrointestinal: Negative.   Neurological: Negative.   Psychiatric/Behavioral: Negative.      Per HPI unless specifically indicated above     Objective:    BP 136/76 (BP Location: Left Arm, Patient Position: Sitting, Cuff Size: Normal)   Pulse 76   Temp 98.4 F (36.9 C) (Oral)   Wt 106 lb 4.8 oz (48.2 kg)   LMP  (LMP Unknown)   SpO2 99%   BMI 20.76 kg/m   Wt Readings from Last 3 Encounters:  08/28/22 106 lb 4.8 oz (48.2 kg)  02/06/22 111 lb 6.4 oz (50.5 kg)  08/09/21 109 lb 9.6 oz (49.7 kg)    Physical Exam Vitals and nursing note reviewed.  Constitutional:      General: She is awake. She is not in acute distress.    Appearance: She is well-developed and well-groomed. She is not ill-appearing.  HENT:     Head: Normocephalic.     Right Ear:  Hearing normal.     Left Ear: Hearing normal.  Eyes:     General: Lids are normal.        Right eye: No discharge.        Left eye: No discharge.     Conjunctiva/sclera: Conjunctivae normal.     Pupils: Pupils are equal, round, and reactive to light.  Neck:     Thyroid: No thyromegaly.     Vascular: No carotid bruit.  Cardiovascular:     Rate and Rhythm: Normal rate and regular rhythm.     Pulses:          Dorsalis pedis pulses are 2+ on the right side and 2+ on the left side.       Posterior tibial pulses are 2+ on the right side and 2+ on the left side.     Heart sounds: Murmur heard.     No gallop.  Pulmonary:     Effort: Pulmonary effort is normal. No accessory muscle usage or respiratory distress.     Breath sounds: Normal breath sounds.  Abdominal:     General: Bowel sounds are normal.     Palpations: Abdomen is soft.  Musculoskeletal:     Cervical back: Normal range of motion and neck supple.     Right lower leg: No tenderness or bony tenderness. No edema.     Left lower leg: No tenderness or bony tenderness. No edema.     Comments: Varicosities to bilateral lower legs.  Skin:    General: Skin is warm and dry.     Comments: Scattered pale purple bruises to bilateral upper extremities.  Neurological:     Mental Status: She is alert and oriented to person, place, and time.  Psychiatric:        Attention and Perception: Attention normal.        Mood and Affect: Mood normal.        Speech: Speech normal.        Behavior: Behavior normal. Behavior is cooperative.        Thought Content: Thought content normal.    Results for orders placed or performed in visit on 02/06/22  Microalbumin, Urine Waived  Result Value Ref Range   Microalb, Ur Waived 80 (H) 0 - 19 mg/L   Creatinine, Urine Waived 50 10 - 300 mg/dL   Microalb/Creat Ratio 30-300 (H) <30 mg/g  Comprehensive metabolic panel  Result Value Ref Range   Glucose 110 (H) 70 - 99 mg/dL  BUN 15 10 - 36 mg/dL    Creatinine, Ser 0.86 0.57 - 1.00 mg/dL   eGFR 63 >59 mL/min/1.73   BUN/Creatinine Ratio 17 12 - 28   Sodium 143 134 - 144 mmol/L   Potassium 3.9 3.5 - 5.2 mmol/L   Chloride 105 96 - 106 mmol/L   CO2 26 20 - 29 mmol/L   Calcium 9.1 8.7 - 10.3 mg/dL   Total Protein 7.0 6.0 - 8.5 g/dL   Albumin 3.7 3.5 - 4.6 g/dL   Globulin, Total 3.3 1.5 - 4.5 g/dL   Albumin/Globulin Ratio 1.1 (L) 1.2 - 2.2   Bilirubin Total 0.3 0.0 - 1.2 mg/dL   Alkaline Phosphatase 64 44 - 121 IU/L   AST 12 0 - 40 IU/L   ALT 5 0 - 32 IU/L  VITAMIN D 25 Hydroxy (Vit-D Deficiency, Fractures)  Result Value Ref Range   Vit D, 25-Hydroxy 33.6 30.0 - 100.0 ng/mL  Iron Binding Cap (TIBC)([facility])  Result Value Ref Range   Total Iron Binding Capacity 293 250 - 450 ug/dL   UIBC 226 118 - 369 ug/dL   Iron 67 27 - 139 ug/dL   Iron Saturation 23 15 - 55 %  Ferritin  Result Value Ref Range   Ferritin 42 15 - 150 ng/mL  CBC with Differential/Platelet  Result Value Ref Range   WBC 5.9 3.4 - 10.8 x10E3/uL   RBC 4.19 3.77 - 5.28 x10E6/uL   Hemoglobin 11.7 11.1 - 15.9 g/dL   Hematocrit 36.6 34.0 - 46.6 %   MCV 87 79 - 97 fL   MCH 27.9 26.6 - 33.0 pg   MCHC 32.0 31.5 - 35.7 g/dL   RDW 12.7 11.7 - 15.4 %   Platelets 238 150 - 450 x10E3/uL   Neutrophils 59 Not Estab. %   Lymphs 28 Not Estab. %   Monocytes 11 Not Estab. %   Eos 1 Not Estab. %   Basos 1 Not Estab. %   Neutrophils Absolute 3.4 1.4 - 7.0 x10E3/uL   Lymphocytes Absolute 1.6 0.7 - 3.1 x10E3/uL   Monocytes Absolute 0.6 0.1 - 0.9 x10E3/uL   EOS (ABSOLUTE) 0.1 0.0 - 0.4 x10E3/uL   Basophils Absolute 0.1 0.0 - 0.2 x10E3/uL   Immature Granulocytes 0 Not Estab. %   Immature Grans (Abs) 0.0 0.0 - 0.1 x10E3/uL      Assessment & Plan:   Problem List Items Addressed This Visit       Cardiovascular and Mediastinum   Aortic atherosclerosis ([place]) - Primary    Chronic.  Noted on imaging 09/21/20, at this time will monitor and avoid restart of ASA +  statin.  History of GI bleed and advanced age.      Relevant Medications   benazepril (LOTENSIN) 5 MG tablet   Essential hypertension    Chronic, stable.  BP at goal for age today on recheck.  Continue current medication regimen and adjust as needed.  LABS: BMP and TSH today.  Recommend she monitor BP at home and document + focus on DASH diet.  Return in 6 months.       Relevant Medications   benazepril (LOTENSIN) 5 MG tablet   Other Relevant Orders   Basic metabolic panel   TSH   Senile purpura (HCC)    Ongoing, stable with no skin breakdown.  Recommend gentle skin care and monitor for wounds, if present then notify provider.      Relevant Medications   benazepril (LOTENSIN) 5 MG tablet  Other   Heart murmur, systolic    Systolic, suspect some mild aortic stenosis based on exam.  At this time no symptoms present, she wishes to hold off on echo.  Will consider this in future to assess further.      Hyperlipidemia    Noted on past labs, due to advanced age will avoid starting statin.  She prefers not to take more medication.      Relevant Medications   benazepril (LOTENSIN) 5 MG tablet   Other Relevant Orders   Basic metabolic panel   Leg cramps, sleep related    Stable, improvement with Magnesium and iron.  Recommend continue Magnesium 400 MG by mouth at night.  Labs next visit, only BMP today.  Return in 6 months, sooner if worsening.      Relevant Orders   Basic metabolic panel   TSH   Other Visit Diagnoses     Flu vaccine need       Flu vacine in office today.   Relevant Orders   Flu Vaccine QUAD High Dose(Fluad) (Completed)        Follow up plan: Return in about 6 months (around 02/27/2023) for HTN, LEG CRAMPS, ARTHRITIS, ANEMIA.

## 2022-08-28 NOTE — Assessment & Plan Note (Signed)
Ongoing, stable with no skin breakdown.  Recommend gentle skin care and monitor for wounds, if present then notify provider.

## 2022-08-29 LAB — BASIC METABOLIC PANEL
BUN/Creatinine Ratio: 28 (ref 12–28)
BUN: 25 mg/dL (ref 10–36)
CO2: 24 mmol/L (ref 20–29)
Calcium: 9.6 mg/dL (ref 8.7–10.3)
Chloride: 100 mmol/L (ref 96–106)
Creatinine, Ser: 0.9 mg/dL (ref 0.57–1.00)
Glucose: 106 mg/dL — ABNORMAL HIGH (ref 70–99)
Potassium: 4.3 mmol/L (ref 3.5–5.2)
Sodium: 137 mmol/L (ref 134–144)
eGFR: 59 mL/min/{1.73_m2} — ABNORMAL LOW (ref >59)

## 2022-08-29 LAB — TSH: TSH: 2.29 u[IU]/mL (ref 0.450–4.500)

## 2022-08-29 NOTE — Progress Notes (Signed)
Good afternoon, please let Rhonda Ingram know her labs have returned and kidney function + thyroid testing and electrolytes are all stable.  No medication changes needed.  Have a wonderful day!!

## 2022-10-22 DIAGNOSIS — H2512 Age-related nuclear cataract, left eye: Secondary | ICD-10-CM | POA: Diagnosis not present

## 2022-10-22 DIAGNOSIS — Z961 Presence of intraocular lens: Secondary | ICD-10-CM | POA: Diagnosis not present

## 2022-10-22 DIAGNOSIS — H353132 Nonexudative age-related macular degeneration, bilateral, intermediate dry stage: Secondary | ICD-10-CM | POA: Diagnosis not present

## 2023-01-01 ENCOUNTER — Telehealth: Payer: Self-pay | Admitting: Nurse Practitioner

## 2023-01-01 NOTE — Telephone Encounter (Signed)
Copied from [place] [phone]. Topic: Medicare AWV >> Jan 01, 2023 11:29 AM Devoria Glassing wrote: [id number] for CRM: Called patient to schedule Medicare Annual Wellness Visit (AWV). No voicemail available to leave a message.  Last date of AWV: 01/01/22  Please schedule an appointment at any time with Kirke Shaggy, NHA  .  If any questions, please contact me.  Thank you ,  [place] Direct Dial: [phone]

## 2023-01-25 ENCOUNTER — Telehealth: Payer: Self-pay | Admitting: Nurse Practitioner

## 2023-01-25 NOTE — Telephone Encounter (Signed)
Copied from [place] [phone]. Topic: Medicare AWV >> Jan 25, 2023 10:57 AM Devoria Glassing wrote: Reason for CRM: Called patient to schedule Medicare Annual Wellness Visit (AWV). No voicemail available to leave a message.  Last date of AWV: 01/01/22  Please schedule an appointment at any time with Kirke Shaggy, LPN .  If any questions, please contact me.  Thank you ,  Sherol Dade; [place] Direct Dial: [phone]

## 2023-02-27 ENCOUNTER — Ambulatory Visit: Payer: Medicare HMO | Admitting: Nurse Practitioner

## 2023-02-27 DIAGNOSIS — I700 Atherosclerosis of aorta: Secondary | ICD-10-CM

## 2023-02-27 DIAGNOSIS — I10 Essential (primary) hypertension: Secondary | ICD-10-CM

## 2023-02-27 DIAGNOSIS — D500 Iron deficiency anemia secondary to blood loss (chronic): Secondary | ICD-10-CM

## 2023-02-27 DIAGNOSIS — Z0000 Encounter for general adult medical examination without abnormal findings: Secondary | ICD-10-CM

## 2023-02-27 DIAGNOSIS — G4762 Sleep related leg cramps: Secondary | ICD-10-CM

## 2023-02-27 DIAGNOSIS — R011 Cardiac murmur, unspecified: Secondary | ICD-10-CM

## 2023-02-27 DIAGNOSIS — E559 Vitamin D deficiency, unspecified: Secondary | ICD-10-CM

## 2023-02-27 DIAGNOSIS — E782 Mixed hyperlipidemia: Secondary | ICD-10-CM

## 2023-02-27 DIAGNOSIS — D692 Other nonthrombocytopenic purpura: Secondary | ICD-10-CM

## 2023-03-21 ENCOUNTER — Telehealth: Payer: Self-pay | Admitting: Nurse Practitioner

## 2023-03-21 NOTE — Telephone Encounter (Signed)
Copied from CRM [id number]. Topic: Medicare AWV >> Mar 21, 2023  9:24 AM Payton Doughty wrote: Reason for CRM: Called patient to schedule Medicare Annual Wellness Visit (AWV). No voicemail available to leave a message.  Last date of AWV: 01/04/2022  Please schedule an appointment at any time with Kennedy Bucker, LPN  .  If any questions, please contact me.  Thank you ,  Verlee Rossetti; Care Guide Ambulatory Clinical Support [facility] Direct Dial: [phone]

## 2023-04-01 ENCOUNTER — Other Ambulatory Visit: Payer: Self-pay | Admitting: Nurse Practitioner

## 2023-04-01 DIAGNOSIS — I10 Essential (primary) hypertension: Secondary | ICD-10-CM

## 2023-04-02 ENCOUNTER — Telehealth: Payer: Self-pay | Admitting: Nurse Practitioner

## 2023-04-02 DIAGNOSIS — I10 Essential (primary) hypertension: Secondary | ICD-10-CM

## 2023-04-02 MED ORDER — BENAZEPRIL HCL 5 MG PO TABS: 5.0000 mg | ORAL_TABLET | Freq: Every day | ORAL | 4 refills | Status: DC

## 2023-04-02 NOTE — Telephone Encounter (Signed)
Requested Prescriptions  Refused Prescriptions Disp Refills   benazepril (LOTENSIN) 5 MG tablet [Pharmacy Med Name: BENAZEPRIL HCL 5 MG TABLET] 90 tablet 2    Sig: TAKE 1 TABLET (5 MG TOTAL) BY MOUTH DAILY.     Cardiovascular:  ACE Inhibitors Failed - 04/01/2023  2:09 PM      Failed - Cr in normal range and within 180 days    Creatinine  Date Value Ref Range Status  07/16/2012 0.66 0.60 - 1.30 mg/dL Final   Creatinine, Ser  Date Value Ref Range Status  08/28/2022 0.90 0.57 - 1.00 mg/dL Final         Failed - K in normal range and within 180 days    Potassium  Date Value Ref Range Status  08/28/2022 4.3 3.5 - 5.2 mmol/L Final  07/16/2012 3.9 3.5 - 5.1 mmol/L Final         Failed - Valid encounter within last 6 months    Recent Outpatient Visits           7 months ago Aortic atherosclerosis (HCC)   [facility] [facility] [place], Corrie Dandy T, NP   1 year ago Aortic atherosclerosis (HCC)   [place] [facility] [place], Corrie Dandy T, NP   1 year ago Senile purpura (HCC)   [place] [facility] [place], Corrie Dandy T, NP   2 years ago Aortic atherosclerosis (HCC)   [facility] [place], Corrie Dandy T, NP   2 years ago Vitamin D deficiency   [facility] [place], [place], DO       Future Appointments             In 1 week Marjie Skiff, NP [place] [facility]            Passed - Patient is not pregnant      Passed - Last BP in normal range    BP Readings from Last 1 Encounters:  08/28/22 136/76

## 2023-04-02 NOTE — Telephone Encounter (Signed)
Pt's granddaughter Lorene Dy is calling in because pt is out of benazepril (LOTENSIN) 5 MG tablet and has been for 3 days. Pt has an appointment on 04/10/23 and wants to know if she can get a prescription that lasts until the appointment or if she could get a full prescription since her appointment is within the next week. Lorene Dy says pt cannot go a week without her medication and is requesting someone give her a follow up call regarding this. Please advise.

## 2023-04-08 NOTE — Patient Instructions (Signed)

## 2023-04-10 ENCOUNTER — Ambulatory Visit (INDEPENDENT_AMBULATORY_CARE_PROVIDER_SITE_OTHER): Payer: Medicare HMO | Admitting: Nurse Practitioner

## 2023-04-10 ENCOUNTER — Encounter: Payer: Self-pay | Admitting: Nurse Practitioner

## 2023-04-10 VITALS — BP 136/80 | HR 68 | Temp 98.4°F | Ht 60.0 in | Wt 101.6 lb

## 2023-04-10 DIAGNOSIS — D692 Other nonthrombocytopenic purpura: Secondary | ICD-10-CM

## 2023-04-10 DIAGNOSIS — G4762 Sleep related leg cramps: Secondary | ICD-10-CM

## 2023-04-10 DIAGNOSIS — I10 Essential (primary) hypertension: Secondary | ICD-10-CM

## 2023-04-10 DIAGNOSIS — I700 Atherosclerosis of aorta: Secondary | ICD-10-CM

## 2023-04-10 DIAGNOSIS — Z0000 Encounter for general adult medical examination without abnormal findings: Secondary | ICD-10-CM

## 2023-04-10 DIAGNOSIS — R011 Cardiac murmur, unspecified: Secondary | ICD-10-CM | POA: Diagnosis not present

## 2023-04-10 DIAGNOSIS — E559 Vitamin D deficiency, unspecified: Secondary | ICD-10-CM

## 2023-04-10 DIAGNOSIS — E782 Mixed hyperlipidemia: Secondary | ICD-10-CM

## 2023-04-10 DIAGNOSIS — D500 Iron deficiency anemia secondary to blood loss (chronic): Secondary | ICD-10-CM

## 2023-04-10 LAB — MICROALBUMIN, URINE WAIVED
Creatinine, Urine Waived: 100 mg/dL (ref 10–300)
Microalb, Ur Waived: 80 mg/L — ABNORMAL HIGH (ref 0–19)

## 2023-04-10 NOTE — Assessment & Plan Note (Signed)
Chronic.  Noted on imaging 09/21/20, at this time will monitor and avoid restart of ASA + statin.  History of GI bleed and advanced age. 

## 2023-04-10 NOTE — Assessment & Plan Note (Signed)
Noted on past labs, due to advanced age will avoid starting statin.  She prefers not to take more medication. 

## 2023-04-10 NOTE — Assessment & Plan Note (Signed)
Stable, improvement with Magnesium and iron.  Recommend continue Magnesium 400 MG by mouth at night.  Labs obtained today.  Return in 6 months, sooner if worsening.

## 2023-04-10 NOTE — Assessment & Plan Note (Signed)
Ongoing, with improvement.  Continue supplement daily and check level today. ?

## 2023-04-10 NOTE — Assessment & Plan Note (Signed)
Systolic, suspect some mild aortic stenosis based on exam.  At this time no symptoms present, she wishes to hold off on echo.  Will consider this in future to assess further. ?

## 2023-04-10 NOTE — Progress Notes (Signed)
BP 136/80 (BP Location: Left Arm, Patient Position: Sitting, Cuff Size: Normal)   Pulse 68   Temp 98.4 F (36.9 C) (Oral)   Ht 5' (1.524 m)   Wt 101 lb 9.6 oz (46.1 kg)   LMP  (LMP Unknown)   SpO2 96%   BMI 19.84 kg/m    Subjective:    Patient ID: Rhonda Ingram, female    DOB: 1925-12-27, 89 y.o.   MRN: [id number]  HPI: Rhonda Ingram is a 89 y.o. female presenting on 04/10/2023 for Medicare Wellness and follow-up visit. Current medical complaints include:none  She currently lives with: lives with grand daughter Menopausal Symptoms: no  HYPERTENSION / HYPERLIPIDEMIA Continues on Benazepril daily -- went one week without medication due to no supply at drug store, has not taken medication yet today.  No statin.  Vitamin D level history, is taking supplement. Satisfied with current treatment? yes Duration of hypertension: chronic BP monitoring frequency: not checking BP range:  BP medication side effects: no Duration of hyperlipidemia: chronic Aspirin: no Recent stressors: no Recurrent headaches: no Visual changes: no Palpitations: no Dyspnea: no Chest pain: no Lower extremity edema: no Dizzy/lightheaded: no   LEG CRAMPS Continues Magnesium and Iron.   Duration: chronic Location:  lower legs Bilateral:  yes Onset: gradual Frequency: intermittent Time of  day:  night time Sudden unintentional leg jerking:   no Paresthesias:   no Decreased sensation:  no Weakness:   no Insomnia:   no Fatigue:   no Alleviating factors: Shaking them out -- magnesium Aggravating factors: none Status: fluctuating Treatments attempted: magnesium  Functional Status Survey: Is the patient deaf or have difficulty hearing?: Yes Does the patient have difficulty seeing, even when wearing glasses/contacts?: No Does the patient have difficulty concentrating, remembering, or making decisions?: No Does the patient have difficulty walking or climbing stairs?: No Does the patient have  difficulty dressing or bathing?: No Does the patient have difficulty doing errands alone such as visiting a doctor's office or shopping?: No      04/10/2023    3:19 PM 08/28/2022    2:45 PM 02/06/2022    1:11 PM 01/01/2022    9:21 AM 08/09/2021    1:29 PM  Depression screen PHQ 2/9  Decreased Interest 0 1 0 0 0  Down, Depressed, Hopeless 0 1 0 0 0  PHQ - 2 Score 0 2 0 0 0  Altered sleeping 3 1 0    Tired, decreased energy 3 3 3     Change in appetite 0 0 0    Feeling bad or failure about yourself  0 0 0    Trouble concentrating 0 2 0    Moving slowly or fidgety/restless 0 2 0    Suicidal thoughts 0 0 0    PHQ-9 Score 6 10 3     Difficult doing work/chores Not difficult at all Somewhat difficult          04/10/2023    3:19 PM 08/28/2022    2:46 PM 02/06/2022    1:11 PM  GAD 7 : Generalized Anxiety Score  Nervous, Anxious, on Edge 0 1 0  Control/stop worrying 0 1 0  Worry too much - different things 0 2 0  Trouble relaxing 0 0 0  Restless 0 0 0  Easily annoyed or irritable 1 1 0  Afraid - awful might happen 0 1 0  Total GAD 7 Score 1 6 0  Anxiety Difficulty Not difficult at all Somewhat difficult Not  difficult at all      11/24/2020    2:49 PM 08/09/2021    1:28 PM 01/01/2022    9:17 AM 08/28/2022    2:46 PM 04/10/2023    3:19 PM  Fall Risk  Falls in the past year?  1 1 0 0  Was there an injury with Fall?  0 0 0 0  Fall Risk Category Calculator  2 1 0 0  Fall Risk Category (Retired)  Moderate Low Low   (RETIRED) Patient Fall Risk Level Low fall risk Moderate fall risk Moderate fall risk Low fall risk   Patient at Risk for Falls Due to  History of fall(s) Impaired balance/gait No Fall Risks No Fall Risks  Fall risk Follow up  Falls evaluation completed Falls evaluation completed;Falls prevention discussed Falls evaluation completed Falls evaluation completed    A voluntary discussion about advance care planning including the explanation and discussion of advance directives  was extensively discussed  with the patient for 10 minutes with patient and daughter present.  Explanation about the health care proxy and Living will was reviewed and packet with forms with explanation of how to fill them out was given.  During this discussion, the patient was able to identify a health care proxy as daughter and plans to fill out the paperwork required.  Patient was offered a separate Advance Care Planning visit for further assistance with forms.     Past Medical History:  Past Medical History:  Diagnosis Date   Arthritis    osteoarthritis   Hyperlipidemia    Hypertension    Osteoporosis    Vitamin D deficiency     Surgical History:  Past Surgical History:  Procedure Laterality Date   CHOLECYSTECTOMY     ESOPHAGOGASTRODUODENOSCOPY (EGD) WITH PROPOFOL N/A 09/23/2020   Procedure: ESOPHAGOGASTRODUODENOSCOPY (EGD) WITH PROPOFOL;  Surgeon: [name], MD;  Location[facility] ENDOSCOPY;  Service: Gastroenterology;  Laterality: N/A;   EYE SURGERY     cataract extraction   FLEXIBLE SIGMOIDOSCOPY N/A 09/23/2020   Procedure: FLEXIBLE SIGMOIDOSCOPY;  Surgeon: [name], MD;  Location: [facility] ENDOSCOPY;  Service: Gastroenterology;  Laterality: N/A;   VENTRAL HERNIA REPAIR N/A 10/08/2016   Procedure: HERNIA REPAIR VENTRAL ADULT;  Surgeon: Kieth Brightly, MD;  Location: [facility] ORS;  Service: General;  Laterality: N/A;    Medications:  Current Outpatient Medications on File Prior to Visit  Medication Sig   benazepril (LOTENSIN) 5 MG tablet Take 1 tablet (5 mg total) by mouth daily.   ferrous sulfate 324 MG TBEC Take 324 mg by mouth.   magnesium oxide (MAG-OX) 400 MG tablet Take 400 mg by mouth at bedtime.   triamcinolone cream (KENALOG) 0.1 % Apply 1 Application topically 2 (two) times daily.   VITAMIN D PO Take 2,000 Units by mouth daily.   No current facility-administered medications on file prior to visit.    Allergies:  Allergies  Allergen Reactions    Alpha-D-Galactosidase Itching and Shortness Of Breath   Meat Extract Shortness Of Breath   Other     Alpha-gal allergy (meat allergy or Mammalian Meat Allergy) beef/red meat/pork    Social History:  Social History   Socioeconomic History   Marital status: Divorced    Spouse name: Not on file   Number of children: Not on file   Years of education: Not on file   Highest education level: 7th grade  Occupational History   Occupation: retired   Tobacco Use   Smoking status: Never   Smokeless  tobacco: Never  Vaping Use   Vaping Use: Never used  Substance and Sexual Activity   Alcohol use: No   Drug use: No   Sexual activity: Never  Other Topics Concern   Not on file  Social History Narrative   Not on file   Social Determinants of Health   Financial Resource Strain: Low Risk  (04/10/2023)   Overall Financial Resource Strain (CARDIA)    Difficulty of Paying Living Expenses: Not hard at all  Food Insecurity: No Food Insecurity (04/10/2023)   Hunger Vital Sign    Worried About Running Out of Food in the Last Year: Never true    Ran Out of Food in the Last Year: Never true  Transportation Needs: No Transportation Needs (04/10/2023)   PRAPARE - Administrator, Civil Service (Medical): No    Lack of Transportation (Non-Medical): No  Physical Activity: Insufficiently Active (04/10/2023)   Exercise Vital Sign    Days of Exercise per Week: 2 days    Minutes of Exercise per Session: 20 min  Stress: No Stress Concern Present (04/10/2023)   [employer] of Occupational Health - Occupational Stress Questionnaire    Feeling of Stress : Not at all  Social Connections: Socially Isolated (04/10/2023)   Social Connection and Isolation Panel [NHANES]    Frequency of Communication with Friends and Family: More than three times a week    Frequency of Social Gatherings with Friends and Family: More than three times a week    Attends Religious Services: Never    Doctor, general practice or Organizations: No    Attends Banker Meetings: Never    Marital Status: Widowed  Intimate Partner Violence: Not At Risk (04/10/2023)   Humiliation, Afraid, Rape, and Kick questionnaire    Fear of Current or Ex-Partner: No    Emotionally Abused: No    Physically Abused: No    Sexually Abused: No   Social History   Tobacco Use  Smoking Status Never  Smokeless Tobacco Never   Social History   Substance and Sexual Activity  Alcohol Use No    Family History:  Family History  Problem Relation Age of Onset   Hypertension Mother    Stroke Mother    Cancer Father        lung   Hypertension Sister    Hyperlipidemia Sister    Hypertension Daughter    Cerebral palsy Daughter    Hypertension Maternal Grandmother    Hypertension Maternal Grandfather    Hypertension Paternal Grandmother    Hypertension Paternal Grandfather    Hypertension Sister    Hyperlipidemia Sister    Migraines Daughter    Diabetes Brother    Hypertension Brother    Hyperlipidemia Brother     Past medical history, surgical history, medications, allergies, family history and social history reviewed with patient today and changes made to appropriate areas of the chart.   ROS All other ROS negative except what is listed above and in the HPI.      Objective:    BP 136/80 (BP Location: Left Arm, Patient Position: Sitting, Cuff Size: Normal)   Pulse 68   Temp 98.4 F (36.9 C) (Oral)   Ht 5' (1.524 m)   Wt 101 lb 9.6 oz (46.1 kg)   LMP  (LMP Unknown)   SpO2 96%   BMI 19.84 kg/m   Wt Readings from Last 3 Encounters:  04/10/23 101 lb 9.6 oz (46.1 kg)  08/28/22 106  lb 4.8 oz (48.2 kg)  02/06/22 111 lb 6.4 oz (50.5 kg)    Physical Exam Vitals and nursing note reviewed. Exam conducted with a chaperone present.  Constitutional:      General: She is awake. She is not in acute distress.    Appearance: She is well-developed and well-groomed. She is not ill-appearing or  toxic-appearing.  HENT:     Head: Normocephalic and atraumatic.     Right Ear: Hearing, tympanic membrane, ear canal and external ear normal. No drainage.     Left Ear: Hearing, tympanic membrane, ear canal and external ear normal. No drainage.     Nose: Nose normal.     Right Sinus: No maxillary sinus tenderness or frontal sinus tenderness.     Left Sinus: No maxillary sinus tenderness or frontal sinus tenderness.     Mouth/Throat:     Mouth: Mucous membranes are moist.     Pharynx: Oropharynx is clear. Uvula midline. No pharyngeal swelling, oropharyngeal exudate or posterior oropharyngeal erythema.  Eyes:     General: Lids are normal.        Right eye: No discharge.        Left eye: No discharge.     Extraocular Movements: Extraocular movements intact.     Conjunctiva/sclera: Conjunctivae normal.     Pupils: Pupils are equal, round, and reactive to light.     Visual Fields: Right eye visual fields normal and left eye visual fields normal.  Neck:     Thyroid: No thyromegaly.     Vascular: No carotid bruit.     Trachea: Trachea normal.  Cardiovascular:     Rate and Rhythm: Normal rate and regular rhythm.     Heart sounds: Normal heart sounds. No murmur heard.    No gallop.  Pulmonary:     Effort: Pulmonary effort is normal. No accessory muscle usage or respiratory distress.     Breath sounds: Normal breath sounds.  Abdominal:     General: Bowel sounds are normal.     Palpations: Abdomen is soft. There is no hepatomegaly or splenomegaly.     Tenderness: There is no abdominal tenderness.  Musculoskeletal:        General: Normal range of motion.     Cervical back: Normal range of motion and neck supple.     Right lower leg: No edema.     Left lower leg: No edema.  Lymphadenopathy:     Head:     Right side of head: No submental, submandibular, tonsillar, preauricular or posterior auricular adenopathy.     Left side of head: No submental, submandibular, tonsillar, preauricular or  posterior auricular adenopathy.     Cervical: No cervical adenopathy.  Skin:    General: Skin is warm and dry.     Capillary Refill: Capillary refill takes less than 2 seconds.     Findings: Bruising present. No rash.     Comments: Scattered bruises bilateral upper extremities.  Neurological:     Mental Status: She is alert and oriented to person, place, and time.     Gait: Gait is intact.     Deep Tendon Reflexes: Reflexes are normal and symmetric.     Reflex Scores:      Brachioradialis reflexes are 2+ on the right side and 2+ on the left side.      Patellar reflexes are 2+ on the right side and 2+ on the left side. Psychiatric:        Attention and Perception:  Attention normal.        Mood and Affect: Mood normal.        Speech: Speech normal.        Behavior: Behavior normal. Behavior is cooperative.        Thought Content: Thought content normal.        Judgment: Judgment normal.       04/10/2023    3:43 PM 01/01/2022    9:23 AM 08/03/2019    1:49 PM 07/30/2018    1:26 PM 07/24/2017    9:49 AM  6CIT Screen  What Year? 0 points 0 points 0 points 0 points 0 points  What month? 0 points 0 points 0 points 0 points 0 points  What time? 0 points 0 points 0 points 0 points 0 points  Count back from 20 0 points 0 points 0 points 0 points 0 points  Months in reverse 0 points 4 points 0 points 0 points 0 points  Repeat phrase 2 points 0 points 2 points 2 points 2 points  Total Score 2 points 4 points 2 points 2 points 2 points   Results for orders placed or performed in visit on 04/10/23  Microalbumin, Urine Waived  Result Value Ref Range   Microalb, Ur Waived 80 (H) 0 - 19 mg/L   Creatinine, Urine Waived 100 10 - 300 mg/dL   Microalb/Creat Ratio 30-300 (H) <30 mg/g      Assessment & Plan:   Problem List Items Addressed This Visit       Cardiovascular and Mediastinum   Aortic atherosclerosis (HCC)    Chronic.  Noted on imaging 09/21/20, at this time will monitor and avoid  restart of ASA + statin.  History of GI bleed and advanced age.      Relevant Orders   Comprehensive metabolic panel   Essential hypertension    Chronic, stable.  BP at goal for age today on recheck.  Continue current medication regimen and adjust as needed.  LABS: CBC, CMP, and TSH today.  Urine ALB 80 May 2024, maintain ACE for kidney protection.  Recommend she monitor BP at home and document + focus on DASH diet.  Return in 6 months.       Relevant Orders   CBC with Differential/Platelet   Comprehensive metabolic panel   TSH   Microalbumin, Urine Waived (Completed)   Senile purpura (HCC)    Ongoing, stable with no skin breakdown.  Recommend gentle skin care and monitor for wounds, if present then notify provider.      Relevant Orders   CBC with Differential/Platelet     Other   Heart murmur, systolic    Systolic, suspect some mild aortic stenosis based on exam.  At this time no symptoms present, she wishes to hold off on echo.  Will consider this in future to assess further.      Hyperlipidemia    Noted on past labs, due to advanced age will avoid starting statin.  She prefers not to take more medication.      Relevant Orders   Comprehensive metabolic panel   Iron deficiency anemia due to chronic blood loss    Improved.  Will recheck CBC, iron, ferritin today.  Recommend she continue daily iron supplement and take this with a little orange juice to enhance absorption.  Also recommend increase iron rich foods in diet.  Return in 6 months.      Relevant Orders   CBC with Differential/Platelet   Iron Binding  Cap (TIBC)([facility])   Ferritin   TSH   Leg cramps, sleep related    Stable, improvement with Magnesium and iron.  Recommend continue Magnesium 400 MG by mouth at night.  Labs obtained today.  Return in 6 months, sooner if worsening.      Relevant Orders   TSH   Vitamin D deficiency    Ongoing, with improvement.  Continue supplement daily and check level  today.      Relevant Orders   VITAMIN D 25 Hydroxy (Vit-D Deficiency, Fractures)   Other Visit Diagnoses     Medicare annual wellness visit, subsequent    -  Primary   Medicare wellnes due and performed with patient today.        Follow up plan: Return in about 6 months (around 10/11/2023) for HTN/HLD, ANEMIA, RLS.   LABORATORY TESTING:  - Pap smear: not applicable  IMMUNIZATIONS:   - Tdap: Tetanus vaccination status reviewed: Refused - Influenza: Up to date - Pneumovax: Up to date - Prevnar: Up to date - COVID: Up to date - HPV: Not applicable - Shingrix vaccine: Refused  SCREENING: -Mammogram: Not applicable  - Colonoscopy: Not applicable  - Bone Density: Up to date  -Hearing Test: Not applicable  -Spirometry: Not applicable   PATIENT COUNSELING:   Advised to take 1 mg of folate supplement per day if capable of pregnancy.   Sexuality: Discussed sexually transmitted diseases, partner selection, use of condoms, avoidance of unintended pregnancy  and contraceptive alternatives.   Advised to avoid cigarette smoking.  I discussed with the patient that most people either abstain from alcohol or drink within safe limits (<=14/week and <=4 drinks/occasion for males, <=7/weeks and <= 3 drinks/occasion for females) and that the risk for alcohol disorders and other health effects rises proportionally with the number of drinks per week and how often a drinker exceeds daily limits.  Discussed cessation/primary prevention of drug use and availability of treatment for abuse.   Diet: Encouraged to adjust caloric intake to maintain  or achieve ideal body weight, to reduce intake of dietary saturated fat and total fat, to limit sodium intake by avoiding high sodium foods and not adding table salt, and to maintain adequate dietary potassium and calcium preferably from fresh fruits, vegetables, and low-fat dairy products.    stressed the importance of regular exercise  Injury  prevention: Discussed safety belts, safety helmets, smoke detector, smoking near bedding or upholstery.   Dental health: Discussed importance of regular tooth brushing, flossing, and dental visits.    NEXT PREVENTATIVE PHYSICAL DUE IN 1 YEAR. Return in about 6 months (around 10/11/2023) for HTN/HLD, ANEMIA, RLS.

## 2023-04-10 NOTE — Assessment & Plan Note (Signed)
Ongoing, stable with no skin breakdown.  Recommend gentle skin care and monitor for wounds, if present then notify provider. 

## 2023-04-10 NOTE — Assessment & Plan Note (Signed)
Improved.  Will recheck CBC, iron, ferritin today.  Recommend she continue daily iron supplement and take this with a little orange juice to enhance absorption.  Also recommend increase iron rich foods in diet.  Return in 6 months. 

## 2023-04-10 NOTE — Assessment & Plan Note (Signed)
Chronic, stable.  BP at goal for age today on recheck.  Continue current medication regimen and adjust as needed.  LABS: CBC, CMP, and TSH today.  Urine ALB 80 May 2024, maintain ACE for kidney protection.  Recommend she monitor BP at home and document + focus on DASH diet.  Return in 6 months.

## 2023-04-11 LAB — COMPREHENSIVE METABOLIC PANEL
ALT: 6 IU/L (ref 0–32)
AST: 15 IU/L (ref 0–40)
Albumin/Globulin Ratio: 1.1 — ABNORMAL LOW (ref 1.2–2.2)
Albumin: 3.7 g/dL (ref 3.6–4.6)
Alkaline Phosphatase: 63 IU/L (ref 44–121)
BUN/Creatinine Ratio: 19 (ref 12–28)
BUN: 20 mg/dL (ref 10–36)
Bilirubin Total: 0.2 mg/dL (ref 0.0–1.2)
CO2: 24 mmol/L (ref 20–29)
Calcium: 9.2 mg/dL (ref 8.7–10.3)
Chloride: 104 mmol/L (ref 96–106)
Creatinine, Ser: 1.08 mg/dL — ABNORMAL HIGH (ref 0.57–1.00)
Globulin, Total: 3.3 g/dL (ref 1.5–4.5)
Glucose: 131 mg/dL — ABNORMAL HIGH (ref 70–99)
Potassium: 4.6 mmol/L (ref 3.5–5.2)
Sodium: 140 mmol/L (ref 134–144)
Total Protein: 7 g/dL (ref 6.0–8.5)
eGFR: 47 mL/min/{1.73_m2} — ABNORMAL LOW (ref >59)

## 2023-04-11 LAB — CBC WITH DIFFERENTIAL/PLATELET
Basophils Absolute: 0.1 10*3/uL (ref 0.0–0.2)
Basos: 1 %
EOS (ABSOLUTE): 0.1 10*3/uL (ref 0.0–0.4)
Eos: 2 %
Hematocrit: 34.3 % (ref 34.0–46.6)
Hemoglobin: 10.7 g/dL — ABNORMAL LOW (ref 11.1–15.9)
Immature Grans (Abs): 0 10*3/uL (ref 0.0–0.1)
Immature Granulocytes: 0 %
Lymphocytes Absolute: 1.5 10*3/uL (ref 0.7–3.1)
Lymphs: 30 %
MCH: 25.7 pg — ABNORMAL LOW (ref 26.6–33.0)
MCHC: 31.2 g/dL — ABNORMAL LOW (ref 31.5–35.7)
MCV: 83 fL (ref 79–97)
Monocytes Absolute: 0.5 10*3/uL (ref 0.1–0.9)
Monocytes: 10 %
Neutrophils Absolute: 2.8 10*3/uL (ref 1.4–7.0)
Neutrophils: 57 %
Platelets: 269 10*3/uL (ref 150–450)
RBC: 4.16 x10E6/uL (ref 3.77–5.28)
RDW: 15.7 % — ABNORMAL HIGH (ref 11.7–15.4)
WBC: 5 10*3/uL (ref 3.4–10.8)

## 2023-04-11 LAB — TSH: TSH: 1.85 u[IU]/mL (ref 0.450–4.500)

## 2023-04-11 LAB — IRON AND TIBC
Iron Saturation: 12 % — ABNORMAL LOW (ref 15–55)
Iron: 42 ug/dL (ref 27–139)
Total Iron Binding Capacity: 357 ug/dL (ref 250–450)
UIBC: 315 ug/dL (ref 118–369)

## 2023-04-11 LAB — FERRITIN: Ferritin: 23 ng/mL (ref 15–150)

## 2023-04-11 LAB — VITAMIN D 25 HYDROXY (VIT D DEFICIENCY, FRACTURES): Vit D, 25-Hydroxy: 31.7 ng/mL (ref 30.0–100.0)

## 2023-04-11 NOTE — Progress Notes (Signed)
Good afternoon, please let Xyliana know her labs have returned: - Kidney function continues to show chronic kidney disease stage 3a to 3b range on labs, some mild downward trend this visit.  Please ensure you are drinking plenty of water daily.  If continues to decline next visit we may need to send to kidney doctor. - Your iron levels is a little in normal range, but lower end of normal.  Please ensure you are taking your iron supplement daily as these labs are showing some mild anemia.  We will recheck next visit. - Remainder of labs are stable.  Any questions? Keep being awesome!!  Thank you for allowing me to participate in your care.  I appreciate you. Kindest regards, Kalif Kattner

## 2023-04-12 ENCOUNTER — Telehealth: Payer: Self-pay

## 2023-04-12 NOTE — Telephone Encounter (Signed)
Pt given lab results per notes of J. [name] NP on 04/12/23. Pt verbalized understanding. Daughter wanting to know if she can buy her "Body Armour" rehydration drink. Pt's air conditioner is not working and she is sweating more. Pt drinks 3-4 cups coffee in am and bottled water during the day with frequent trips to [facility] to void during the night.  Please advise daughter.

## 2023-04-15 NOTE — Telephone Encounter (Signed)
Patient's daughter was made aware of Jolene's recommendations.

## 2023-05-01 ENCOUNTER — Encounter: Payer: Self-pay | Admitting: Family Medicine

## 2023-05-01 ENCOUNTER — Inpatient Hospital Stay: Payer: Medicare HMO

## 2023-05-01 ENCOUNTER — Inpatient Hospital Stay
Admission: EM | Admit: 2023-05-01 | Discharge: 2023-05-05 | DRG: 378 | Disposition: A | Discharge status: Home / Self Care | Payer: Medicare HMO | Attending: Student | Admitting: Student

## 2023-05-01 ENCOUNTER — Ambulatory Visit: Payer: Self-pay | Admitting: *Deleted

## 2023-05-01 ENCOUNTER — Other Ambulatory Visit: Payer: Self-pay

## 2023-05-01 DIAGNOSIS — Z8249 Family history of ischemic heart disease and other diseases of the circulatory system: Secondary | ICD-10-CM | POA: Diagnosis not present

## 2023-05-01 DIAGNOSIS — R54 Age-related physical debility: Secondary | ICD-10-CM | POA: Diagnosis present

## 2023-05-01 DIAGNOSIS — Z79899 Other long term (current) drug therapy: Secondary | ICD-10-CM | POA: Diagnosis not present

## 2023-05-01 DIAGNOSIS — I10 Essential (primary) hypertension: Secondary | ICD-10-CM | POA: Diagnosis not present

## 2023-05-01 DIAGNOSIS — E441 Mild protein-calorie malnutrition: Secondary | ICD-10-CM | POA: Diagnosis present

## 2023-05-01 DIAGNOSIS — E785 Hyperlipidemia, unspecified: Secondary | ICD-10-CM | POA: Diagnosis not present

## 2023-05-01 DIAGNOSIS — H9190 Unspecified hearing loss, unspecified ear: Secondary | ICD-10-CM | POA: Diagnosis present

## 2023-05-01 DIAGNOSIS — D62 Acute posthemorrhagic anemia: Secondary | ICD-10-CM | POA: Diagnosis present

## 2023-05-01 DIAGNOSIS — M1990 Unspecified osteoarthritis, unspecified site: Secondary | ICD-10-CM | POA: Diagnosis present

## 2023-05-01 DIAGNOSIS — Z66 Do not resuscitate: Secondary | ICD-10-CM | POA: Diagnosis present

## 2023-05-01 DIAGNOSIS — E876 Hypokalemia: Secondary | ICD-10-CM | POA: Diagnosis present

## 2023-05-01 DIAGNOSIS — G8929 Other chronic pain: Secondary | ICD-10-CM | POA: Diagnosis present

## 2023-05-01 DIAGNOSIS — Z6821 Body mass index (BMI) 21.0-21.9, adult: Secondary | ICD-10-CM | POA: Diagnosis not present

## 2023-05-01 DIAGNOSIS — Z7189 Other specified counseling: Secondary | ICD-10-CM | POA: Diagnosis not present

## 2023-05-01 DIAGNOSIS — Z91018 Allergy to other foods: Secondary | ICD-10-CM

## 2023-05-01 DIAGNOSIS — M810 Age-related osteoporosis without current pathological fracture: Secondary | ICD-10-CM | POA: Diagnosis present

## 2023-05-01 DIAGNOSIS — I700 Atherosclerosis of aorta: Secondary | ICD-10-CM | POA: Diagnosis not present

## 2023-05-01 DIAGNOSIS — K5731 Diverticulosis of large intestine without perforation or abscess with bleeding: Principal | ICD-10-CM | POA: Diagnosis present

## 2023-05-01 DIAGNOSIS — Z83438 Family history of other disorder of lipoprotein metabolism and other lipidemia: Secondary | ICD-10-CM | POA: Diagnosis not present

## 2023-05-01 DIAGNOSIS — Z539 Procedure and treatment not carried out, unspecified reason: Secondary | ICD-10-CM | POA: Diagnosis present

## 2023-05-01 DIAGNOSIS — Z555 Less than a high school diploma: Secondary | ICD-10-CM

## 2023-05-01 DIAGNOSIS — K922 Gastrointestinal hemorrhage, unspecified: Secondary | ICD-10-CM | POA: Diagnosis present

## 2023-05-01 DIAGNOSIS — Z9049 Acquired absence of other specified parts of digestive tract: Secondary | ICD-10-CM | POA: Diagnosis not present

## 2023-05-01 DIAGNOSIS — Z515 Encounter for palliative care: Secondary | ICD-10-CM

## 2023-05-01 DIAGNOSIS — M545 Low back pain, unspecified: Secondary | ICD-10-CM | POA: Diagnosis present

## 2023-05-01 DIAGNOSIS — Z833 Family history of diabetes mellitus: Secondary | ICD-10-CM | POA: Diagnosis not present

## 2023-05-01 DIAGNOSIS — E559 Vitamin D deficiency, unspecified: Secondary | ICD-10-CM | POA: Diagnosis present

## 2023-05-01 DIAGNOSIS — Z823 Family history of stroke: Secondary | ICD-10-CM

## 2023-05-01 LAB — CBC
HCT: 35.4 % — ABNORMAL LOW (ref 36.0–46.0)
Hemoglobin: 10.9 g/dL — ABNORMAL LOW (ref 12.0–15.0)
MCH: 26.8 pg (ref 26.0–34.0)
MCHC: 30.8 g/dL (ref 30.0–36.0)
MCV: 87.2 fL (ref 80.0–100.0)
Platelets: 228 10*3/uL (ref 150–400)
RBC: 4.06 MIL/uL (ref 3.87–5.11)
RDW: 16.4 % — ABNORMAL HIGH (ref 11.5–15.5)
WBC: 6 10*3/uL (ref 4.0–10.5)
nRBC: 0 % (ref 0.0–0.2)

## 2023-05-01 LAB — TYPE AND SCREEN
ABO/RH(D): A POS
Antibody Screen: NEGATIVE

## 2023-05-01 LAB — BASIC METABOLIC PANEL
Anion gap: 10 (ref 5–15)
BUN: 23 mg/dL (ref 8–23)
CO2: 23 mmol/L (ref 22–32)
Calcium: 8.4 mg/dL — ABNORMAL LOW (ref 8.9–10.3)
Chloride: 103 mmol/L (ref 98–111)
Creatinine, Ser: 0.9 mg/dL (ref 0.44–1.00)
GFR, Estimated: 59 mL/min — ABNORMAL LOW (ref >60)
Glucose, Bld: 119 mg/dL — ABNORMAL HIGH (ref 70–99)
Potassium: 4 mmol/L (ref 3.5–5.1)
Sodium: 136 mmol/L (ref 135–145)

## 2023-05-01 LAB — HEMOGLOBIN AND HEMATOCRIT, BLOOD
HCT: 35.8 % — ABNORMAL LOW (ref 36.0–46.0)
Hemoglobin: 11.3 g/dL — ABNORMAL LOW (ref 12.0–15.0)

## 2023-05-01 MED ORDER — SODIUM CHLORIDE 0.9 % IV SOLN: INTRAVENOUS | Status: DC

## 2023-05-01 MED ORDER — IOHEXOL 350 MG/ML SOLN
100.0000 mL | Freq: Once | INTRAVENOUS | Status: AC | PRN
  Administered 2023-05-01: 100 mL via INTRAVENOUS

## 2023-05-01 NOTE — ED Notes (Signed)
This RN to bedside, pt. Is pleasant and conversational and hard of hearing. Pt. States she has been bleeding bright red blood from her bottom and this has happened before. Pt. Is alert and oriented. Pt. Up with stand by assist to bathroom. Gait at baseline, NAD.

## 2023-05-01 NOTE — Assessment & Plan Note (Signed)
BP stable Titrate home regimen 

## 2023-05-01 NOTE — ED Provider Notes (Signed)
[facility] Provider Note    Event Date/Time   First MD Initiated Contact with Patient 05/01/23 1508     (approximate)   History   Chief Complaint Rectal Bleeding   HPI  Rhonda Ingram is a 89 y.o. female with past medical history of hypertension, hyperlipidemia, and arthritis who presents to the ED complaining of rectal bleeding.  Patient reports that since yesterday she has been passing bright red blood from her rectum.  She states that it was a small amount yesterday but has had 2 large bowel movements with blood earlier today.  She reports leaking blood from her rectal area between these bowel movements, denies any associated pain in her rectal area or abdomen.  She does report a history of hemorrhoids but states she has never had bleeding this severe.  She does not take any blood thinners.  She has not had any nausea or vomiting.     Physical Exam   Triage Vital Signs: ED Triage Vitals  Enc Vitals Group     BP 05/01/23 1323 130/79     Pulse Rate 05/01/23 1323 79     Resp 05/01/23 1323 18     Temp 05/01/23 1321 98.5 F (36.9 C)     Temp src --      SpO2 05/01/23 1323 100 %     Weight 05/01/23 1323 112 lb (50.8 kg)     Height 05/01/23 1323 5' (1.524 m)     Head Circumference --      Peak Flow --      Pain Score 05/01/23 1323 0     Pain Loc --      Pain Edu? --      Excl. in GC? --     Most recent vital signs: Vitals:   05/01/23 1321 05/01/23 1323  BP:  130/79  Pulse:  79  Resp:  18  Temp: 98.5 F (36.9 C) 98.4 F (36.9 C)  SpO2:  100%    Constitutional: Alert and oriented. Eyes: Conjunctivae are normal. Head: Atraumatic. Nose: No congestion/rhinnorhea. Mouth/Throat: Mucous membranes are moist.  Cardiovascular: Normal rate, regular rhythm. Grossly normal heart sounds.  2+ radial pulses bilaterally. Respiratory: Normal respiratory effort.  No retractions. Lungs CTAB. Gastrointestinal: Soft and nontender. No distention.  Rectal  exam with nonbleeding external hemorrhoids.  Digital exam with dark blood. Musculoskeletal: No lower extremity tenderness nor edema.  Neurologic:  Normal speech and language. No gross focal neurologic deficits are appreciated.    ED Results / Procedures / Treatments   Labs (all labs ordered are listed, but only abnormal results are displayed) Labs Reviewed  CBC - Abnormal; Notable for the following components:      Result Value   Hemoglobin 10.9 (*)    HCT 35.4 (*)    RDW 16.4 (*)    All other components within normal limits  BASIC METABOLIC PANEL - Abnormal; Notable for the following components:   Glucose, Bld 119 (*)    Calcium 8.4 (*)    GFR, Estimated 59 (*)    All other components within normal limits  HEMOGLOBIN AND HEMATOCRIT, BLOOD - Abnormal; Notable for the following components:   Hemoglobin 11.3 (*)    HCT 35.8 (*)    All other components within normal limits  TYPE AND SCREEN    PROCEDURES:  Critical Care performed: No  Procedures   MEDICATIONS ORDERED IN ED: Medications - No data to display   IMPRESSION / MDM / ASSESSMENT  AND PLAN / ED COURSE  I reviewed the triage vital signs and the nursing notes.                              89 y.o. female with past medical history of hypertension, hyperlipidemia, and arthritis who presents to the ED complaining of rectal bleeding since yesterday, worsening today.  Patient's presentation is most consistent with acute presentation with potential threat to life or bodily function.  Differential diagnosis includes, but is not limited to, upper GI bleed, lower GI bleed, bleeding hemorrhoids, anemia, electrolyte abnormality, AKI.  Patient nontoxic-appearing and in no acute distress, vital signs are unremarkable.  She denies any pain and has a benign abdominal exam.  Rectal exam with external hemorrhoids that do not appear to be actively bleeding, however with digital rectal exam she has significant dark blood.  I am  concerned that she could have a lower GI bleed, although initial H&H is reassuring and stable compared to previous.  No significant leukocytosis, electrolyte abnormality, or AKI noted.  We will trend H&H but patient would benefit from admission to the hospital for observation.  Case discussed with hospitalist for admission.      FINAL CLINICAL IMPRESSION(S) / ED DIAGNOSES   Final diagnoses:  Lower GI bleed     Rx / DC Orders   ED Discharge Orders     None        Note:  This document was prepared using Dragon voice recognition software and may include unintentional dictation errors.   Chesley Noon, MD 05/01/23 [phone]

## 2023-05-01 NOTE — ED Triage Notes (Signed)
Pt to ED for rectal bleeding started yesterday. No blood thinner use.

## 2023-05-01 NOTE — Consult Note (Signed)
[facility] GI Inpatient Consult Note   Jamey Reas, M.D.  Reason for Consult: Lower gastrointestinal bleeding   Attending Requesting Consult: Doree Albee, M.D.  Outpatient Primary Physician: Aura Dials, NP  History of Present Illness: Rhonda Ingram is a 89 y.o. female with complaints of acute lower gastrointestinal bleeding (bright red blood per rectum) beginning yesterday. Patient underwent EGD and flexible sigmoidoscopy by Dr. Tobi Bastos at [facility] on 09/23/2020 revealing a normal EGD and inadequate prep of colon with retained stool in the sigmoid and rectum on sigmoidoscopy. No bleeding was noted.  Patient had a CT scan of the abdomen and pelvis in November of 2021 that showed sigmoid diverticulosis. It was determined at that time that cause of self-limited bleeding was sigmoid diverticulosis. Patient seen in the hospital room. Patient appears very HOH but is oriented to place, time, person. She admits to being forgetful, but is appropriate. She recalls undergoing colonoscopy several years ago and reports "think they removed polyps". Denies nausea, vomiting, abdominal pain.   Past Medical History:  Past Medical History:  Diagnosis Date   Arthritis    osteoarthritis   Hyperlipidemia    Hypertension    Osteoporosis    Vitamin D deficiency     Problem List: Patient Active Problem List   Diagnosis Date Noted   GIB (gastrointestinal bleeding) 05/01/2023   At high risk for falls 02/06/2022   Heart murmur, systolic 02/06/2022   Leg cramps, sleep related 08/09/2021   Osteoarthritis 08/09/2021   Iron deficiency anemia due to chronic blood loss 02/06/2021   Aortic atherosclerosis (HCC) 02/01/2021   Chronic back pain    Diverticulosis 09/21/2020   Vitamin D deficiency 02/01/2020   Senile purpura (HCC) 08/03/2019   Advanced care planning/counseling discussion 03/18/2018   Ventral hernia 09/03/2016   Hypertension 08/02/2015   Hyperlipidemia 08/02/2015     Past Surgical History: Past Surgical History:  Procedure Laterality Date   CHOLECYSTECTOMY     ESOPHAGOGASTRODUODENOSCOPY (EGD) WITH PROPOFOL N/A 09/23/2020   Procedure: ESOPHAGOGASTRODUODENOSCOPY (EGD) WITH PROPOFOL;  Surgeon: [name], MD;  Location: [facility] ENDOSCOPY;  Service: Gastroenterology;  Laterality: N/A;   EYE SURGERY     cataract extraction   FLEXIBLE SIGMOIDOSCOPY N/A 09/23/2020   Procedure: FLEXIBLE SIGMOIDOSCOPY;  Surgeon: [name], MD;  Location: [facility] ENDOSCOPY;  Service: Gastroenterology;  Laterality: N/A;   VENTRAL HERNIA REPAIR N/A 10/08/2016   Procedure: HERNIA REPAIR VENTRAL ADULT;  Surgeon: Kieth Brightly, MD;  Location: [facility] ORS;  Service: General;  Laterality: N/A;    Allergies: Allergies  Allergen Reactions   Alpha-D-Galactosidase Itching and Shortness Of Breath   Meat Extract Shortness Of Breath   Other     Alpha-gal allergy (meat allergy or Mammalian Meat Allergy) beef/red meat/pork    Home Medications: Medications Prior to Admission  Medication Sig Dispense Refill Last Dose   benazepril (LOTENSIN) 5 MG tablet Take 1 tablet (5 mg total) by mouth daily. 90 tablet 4 05/01/2023   ferrous sulfate 324 MG TBEC Take 324 mg by mouth.   05/01/2023   magnesium oxide (MAG-OX) 400 MG tablet Take 400 mg by mouth at bedtime.   04/30/2023   VITAMIN D PO Take 2,000 Units by mouth daily.   05/01/2023   triamcinolone cream (KENALOG) 0.1 % Apply 1 Application topically 2 (two) times daily. (Patient not taking: Reported on 05/01/2023) 30 g 0 Not Taking   Home medication reconciliation was completed with the patient.   Scheduled Inpatient Medications:    Continuous Inpatient  Infusions:    sodium chloride      PRN Inpatient Medications:  iohexol  Family History: family history includes Cancer in her father; Cerebral palsy in her daughter; Diabetes in her brother; Hyperlipidemia in her brother, sister, and sister; Hypertension in her brother, daughter, maternal  grandfather, maternal grandmother, mother, paternal grandfather, paternal grandmother, sister, and sister; Migraines in her daughter; Stroke in her mother.   GI Family History: Negaitive, unknown.  Social History:   reports that she has never smoked. She has never used smokeless tobacco. She reports that she does not drink alcohol and does not use drugs. The patient denies ETOH, tobacco, or drug use.    Review of Systems: Review of Systems - Negative except HPI  Physical Examination: BP (!) 157/86 (BP Location: Right Arm)   Pulse 66   Temp 98.5 F (36.9 C)   Resp 16   Ht 5' (1.524 m)   Wt 50.8 kg   LMP  (LMP Unknown)   SpO2 99%   BMI 21.87 kg/m  Physical Exam Constitutional:      General: She is not in acute distress.    Appearance: She is not ill-appearing.     Comments: Hard of hearing  HENT:     Head: Normocephalic and atraumatic.     Nose: Nose normal.  Eyes:     Extraocular Movements: Extraocular movements intact.     Pupils: Pupils are equal, round, and reactive to light.  Cardiovascular:     Rate and Rhythm: Normal rate.     Pulses: Normal pulses.  Pulmonary:     Effort: Pulmonary effort is normal.  Abdominal:     General: Bowel sounds are normal. There is no distension.     Palpations: There is no mass.     Tenderness: There is no abdominal tenderness.  Skin:    General: Skin is warm and dry.  Neurological:     Mental Status: She is alert.     Comments: Hard of hearing  Psychiatric:        Mood and Affect: Mood normal.        Thought Content: Thought content normal.        Judgment: Judgment normal.     Data: Lab Results  Component Value Date   WBC 6.0 05/01/2023   HGB 11.3 (L) 05/01/2023   HCT 35.8 (L) 05/01/2023   MCV 87.2 05/01/2023   PLT 228 05/01/2023   Recent Labs  Lab 05/01/23 1326 05/01/23 1547  HGB 10.9* 11.3*   Lab Results  Component Value Date   NA 136 05/01/2023   K 4.0 05/01/2023   CL 103 05/01/2023   CO2 23 05/01/2023    BUN 23 05/01/2023   CREATININE 0.90 05/01/2023   Lab Results  Component Value Date   ALT 6 04/10/2023   AST 15 04/10/2023   ALKPHOS 63 04/10/2023   BILITOT 0.2 04/10/2023   No results for input(s): "APTT", "INR", "PTT" in the last 168 hours.    Latest Ref Rng & Units 05/01/2023    3:47 PM 05/01/2023    1:26 PM 04/10/2023    3:21 PM  CBC  WBC 4.0 - 10.5 K/uL  6.0  5.0   Hemoglobin 12.0 - 15.0 g/dL 16.1  09.6  04.5   Hematocrit 36.0 - 46.0 % 35.8  35.4  34.3   Platelets 150 - 400 K/uL  228  269     STUDIES: No results found. @IMAGES @  Assessment:  Acute LGI bleed -  Likely diverticular in origin (sigmoid location most likely). Anemia secondary  to GI blood loss. Hard of hearing. Chronic low back pain. Advanced age.   Recommendations:  Patient advised to undergo CTA to locallize bleeding area. Will follow.  Thank you for the consult. Please call with questions or concerns.  Rosina Lowenstein, "Mellody Dance MD [facility] [address] [place], [place] [address] [phone]  05/01/2023 5:54 PM

## 2023-05-01 NOTE — H&P (Signed)
History and Physical    Patient: Rhonda Ingram WUJ:[id number] DOB: 23-Jan-1926 DOA: 05/01/2023 DOS: the patient was seen and examined on 05/01/2023 PCP: Marjie Skiff, NP  Patient coming from: Home  Chief Complaint:  Chief Complaint  Patient presents with   Rectal Bleeding   HPI: Rhonda Ingram is a 89 y.o. female with medical history significant of hyperlipidemia, hypertension presenting with lower GI bleeding.  Patient reports recurrent rectal bleeding over the past 1 to 2 days.  No fevers or chills.  No nausea or vomiting.  Minimal to mild abdominal pain.  Noted similar issue requiring admission November 2021. Patient denies any recent blood thinner use, though she does report some ? Recent NSAID use w/ goody powder. No CP or SOB. No hemiparesis or confusion.  Presented to ER afebrile, hemodynamically stable. WBC 6, hgb 11, plt 230s. Cr 0.9.  Review of Systems: As mentioned in the history of present illness. All other systems reviewed and are negative. Past Medical History:  Diagnosis Date   Arthritis    osteoarthritis   Hyperlipidemia    Hypertension    Osteoporosis    Vitamin D deficiency    Past Surgical History:  Procedure Laterality Date   CHOLECYSTECTOMY     ESOPHAGOGASTRODUODENOSCOPY (EGD) WITH PROPOFOL N/A 09/23/2020   Procedure: ESOPHAGOGASTRODUODENOSCOPY (EGD) WITH PROPOFOL;  Surgeon: [name], MD;  Location: [facility] ENDOSCOPY;  Service: Gastroenterology;  Laterality: N/A;   EYE SURGERY     cataract extraction   FLEXIBLE SIGMOIDOSCOPY N/A 09/23/2020   Procedure: FLEXIBLE SIGMOIDOSCOPY;  Surgeon: [name], MD;  Location: [facility] ENDOSCOPY;  Service: Gastroenterology;  Laterality: N/A;   VENTRAL HERNIA REPAIR N/A 10/08/2016   Procedure: HERNIA REPAIR VENTRAL ADULT;  Surgeon: Kieth Brightly, MD;  Location: [facility] ORS;  Service: General;  Laterality: N/A;   Social History:  reports that she has never smoked. She has never used smokeless tobacco. She reports  that she does not drink alcohol and does not use drugs.  Allergies  Allergen Reactions   Alpha-D-Galactosidase Itching and Shortness Of Breath   Meat Extract Shortness Of Breath   Other     Alpha-gal allergy (meat allergy or Mammalian Meat Allergy) beef/red meat/pork    Family History  Problem Relation Age of Onset   Hypertension Mother    Stroke Mother    Cancer Father        lung   Hypertension Sister    Hyperlipidemia Sister    Hypertension Daughter    Cerebral palsy Daughter    Hypertension Maternal Grandmother    Hypertension Maternal Grandfather    Hypertension Paternal Grandmother    Hypertension Paternal Grandfather    Hypertension Sister    Hyperlipidemia Sister    Migraines Daughter    Diabetes Brother    Hypertension Brother    Hyperlipidemia Brother     Prior to Admission medications   Medication Sig Start Date End Date Taking? Authorizing Provider  benazepril (LOTENSIN) 5 MG tablet Take 1 tablet (5 mg total) by mouth daily. 04/02/23  Yes [name], Jolene T, NP  ferrous sulfate 324 MG TBEC Take 324 mg by mouth.   Yes [provider]  magnesium oxide (MAG-OX) 400 MG tablet Take 400 mg by mouth at bedtime.   Yes [provider]  VITAMIN D PO Take 2,000 Units by mouth daily.   Yes [provider]  triamcinolone cream (KENALOG) 0.1 % Apply 1 Application topically 2 (two) times daily. Patient not taking: Reported on 05/01/2023 06/06/22  Aura Dials T, NP    Physical Exam: Vitals:   05/01/23 1323 05/01/23 1615 05/01/23 1630 05/01/23 1645  BP: 130/79  (!) 168/82   Pulse: 79 63 60 60  Resp: 18     Temp: 98.4 F (36.9 C)  98.2 F (36.8 C)   TempSrc:   Oral   SpO2: 100% 100% 100% 98%  Weight: 50.8 kg     Height: 5' (1.524 m)      Physical Exam Constitutional:      Appearance: She is normal weight.  HENT:     Head: Normocephalic.     Nose: Nose normal.     Mouth/Throat:     Mouth: Mucous membranes are moist.  Eyes:      Pupils: Pupils are equal, round, and reactive to light.  Cardiovascular:     Rate and Rhythm: Normal rate and regular rhythm.  Pulmonary:     Effort: Pulmonary effort is normal.  Abdominal:     General: Bowel sounds are normal.  Musculoskeletal:        General: Normal range of motion.     Cervical back: Normal range of motion.  Skin:    General: Skin is warm.  Neurological:     General: No focal deficit present.  Psychiatric:        Mood and Affect: Mood normal.     Data Reviewed:  There are no new results to review at this time.   Lab Results  Component Value Date   WBC 6.0 05/01/2023   HGB 11.3 (L) 05/01/2023   HCT 35.8 (L) 05/01/2023   MCV 87.2 05/01/2023   PLT 228 05/01/2023   Last metabolic panel Lab Results  Component Value Date   GLUCOSE 119 (H) 05/01/2023   NA 136 05/01/2023   K 4.0 05/01/2023   CL 103 05/01/2023   CO2 23 05/01/2023   BUN 23 05/01/2023   CREATININE 0.90 05/01/2023   GFRNONAA 59 (L) 05/01/2023   CALCIUM 8.4 (L) 05/01/2023   PROT 7.0 04/10/2023   ALBUMIN 3.7 04/10/2023   LABGLOB 3.3 04/10/2023   AGRATIO 1.1 (L) 04/10/2023   BILITOT 0.2 04/10/2023   ALKPHOS 63 04/10/2023   AST 15 04/10/2023   ALT 6 04/10/2023   ANIONGAP 10 05/01/2023    Assessment and Plan: * GIB (gastrointestinal bleeding) Recurrent lower GI bleeding x 1 to 2 days Bright red blood per rectum Hemoglobin stable at 10.9 Noted admission November 2021 for similar issues ?  Intermittent NSAID/antiplatelet use with BC powders at home Discussed avoidance Will formally consult Dr. Norma Fredrickson for evaluation GI CTA pending  Trend hemoglobin Transfuse for hemoglobin less than 7 Follow closely  Hypertension BP stable Titrate home regimen      Advance Care Planning:   Code Status: Full Code   Consults: Gastroenterology w/ Dr. Norma Fredrickson   Family Communication: Daughter at the bedside   Severity of Illness: The appropriate patient status for this patient is  OBSERVATION. Observation status is judged to be reasonable and necessary in order to provide the required intensity of service to ensure the patient's safety. The patient's presenting symptoms, physical exam findings, and initial radiographic and laboratory data in the context of their medical condition is felt to place them at decreased risk for further clinical deterioration. Furthermore, it is anticipated that the patient will be medically stable for discharge from the hospital within 2 midnights of admission.   Author: Floydene Flock, MD 05/01/2023 5:27 PM  For on call review [email or web address].

## 2023-05-01 NOTE — Progress Notes (Signed)
Patient sent to CT via bed in stable condition. 

## 2023-05-01 NOTE — ED Notes (Signed)
Transport requested

## 2023-05-01 NOTE — Progress Notes (Signed)
+   active bleed in mid sigmoid colon on CT Angio  Dr. Lorretta Harp w/ vascular consulted for embolization

## 2023-05-01 NOTE — Telephone Encounter (Signed)
Unable to reach either the daughter or granddaughter, lvm

## 2023-05-01 NOTE — Consult Note (Signed)
MRN : [id number]  Rhonda Ingram is a 89 y.o. ([birth year]) female who presents with chief complaint of check circulation.  History of Present Illness:   I am asked to evaluate the patient by Dr. Norma Fredrickson.  Patient is a 89 year old woman admitted to [facility] earlier today for GI bleed.  By the patient's account she has had rectal bleeding for the past day or 2.  She denies abdominal pain.  She denies nausea vomiting.  In review of the chart there was a similar episode in November 2021.  Although she is not on any prescribed blood thinners she has been using Goody powder recently.  The patient was hemodynamically stable in the emergency room and has remained hemodynamically stable since being admitted.  She has a known history of diverticulosis.  GI is involved as noted above.  I have personally reviewed the CT scan from earlier today dated 05/01/2023.  Her IMA does appear to be patent although her aorta is densely calcified.  There does appear to be contrast enhancement within the lumen of the sigmoid.  Current Meds  Medication Sig   benazepril (LOTENSIN) 5 MG tablet Take 1 tablet (5 mg total) by mouth daily.   ferrous sulfate 324 MG TBEC Take 324 mg by mouth.   magnesium oxide (MAG-OX) 400 MG tablet Take 400 mg by mouth at bedtime.   VITAMIN D PO Take 2,000 Units by mouth daily.    Past Medical History:  Diagnosis Date   Arthritis    osteoarthritis   Hyperlipidemia    Hypertension    Osteoporosis    Vitamin D deficiency     Past Surgical History:  Procedure Laterality Date   CHOLECYSTECTOMY     ESOPHAGOGASTRODUODENOSCOPY (EGD) WITH PROPOFOL N/A 09/23/2020   Procedure: ESOPHAGOGASTRODUODENOSCOPY (EGD) WITH PROPOFOL;  Surgeon: [name], MD;  Location: [facility] ENDOSCOPY;  Service: Gastroenterology;  Laterality: N/A;   EYE SURGERY     cataract extraction   FLEXIBLE SIGMOIDOSCOPY N/A  09/23/2020   Procedure: FLEXIBLE SIGMOIDOSCOPY;  Surgeon: [name], MD;  Location: [facility] ENDOSCOPY;  Service: Gastroenterology;  Laterality: N/A;   VENTRAL HERNIA REPAIR N/A 10/08/2016   Procedure: HERNIA REPAIR VENTRAL ADULT;  Surgeon: Kieth Brightly, MD;  Location: [facility] ORS;  Service: General;  Laterality: N/A;    Social History Social History   Tobacco Use   Smoking status: Never   Smokeless tobacco: Never  Vaping Use   Vaping Use: Never used  Substance Use Topics   Alcohol use: No   Drug use: No    Family History Family History  Problem Relation Age of Onset   Hypertension Mother    Stroke Mother    Cancer Father        lung   Hypertension Sister    Hyperlipidemia Sister    Hypertension Daughter    Cerebral palsy Daughter    Hypertension Maternal Grandmother    Hypertension Maternal Grandfather    Hypertension Paternal Grandmother    Hypertension Paternal Grandfather    Hypertension Sister    Hyperlipidemia Sister    Migraines Daughter  Diabetes Brother    Hypertension Brother    Hyperlipidemia Brother     Allergies  Allergen Reactions   Alpha-D-Galactosidase Itching and Shortness Of Breath   Meat Extract Shortness Of Breath   Other     Alpha-gal allergy (meat allergy or Mammalian Meat Allergy) beef/red meat/pork     REVIEW OF SYSTEMS (Negative unless checked)  Constitutional: [] Weight loss  [] Fever  [] Chills Cardiac: [] Chest pain   [] Chest pressure   [] Palpitations   [] Shortness of breath when laying flat   [] Shortness of breath with exertion. Vascular:  [x] Pain in legs with walking   [] Pain in legs at rest  [] History of DVT   [] Phlebitis   [] Swelling in legs   [] Varicose veins   [] Non-healing ulcers Pulmonary:   [] Uses home oxygen   [] Productive cough   [] Hemoptysis   [] Wheeze  [] COPD   [] Asthma Neurologic:  [] Dizziness   [] Seizures   [] History of stroke   [] History of TIA  [] Aphasia   [] Vissual changes   [] Weakness or numbness in arm    [] Weakness or numbness in leg Musculoskeletal:   [] Joint swelling   [x] Joint pain   [] Low back pain Hematologic:  [] Easy bruising  [] Easy bleeding   [] Hypercoagulable state   [] Anemic Gastrointestinal:  [] Diarrhea   [] Vomiting  [] Gastroesophageal reflux/heartburn   [] Difficulty swallowing. Genitourinary:  [] Chronic kidney disease   [] Difficult urination  [] Frequent urination   [] Blood in urine Skin:  [] Rashes   [] Ulcers  Psychological:  [] History of anxiety   []  History of major depression.  Physical Examination  Vitals:   05/01/23 1615 05/01/23 1630 05/01/23 1645 05/01/23 1747  BP:  (!) 168/82  (!) 157/86  Pulse: 63 60 60 66  Resp:    16  Temp:  98.2 F (36.8 C)  98.5 F (36.9 C)  TempSrc:  Oral  Oral  SpO2: 100% 100% 98% 99%  Weight:      Height:       Body mass index is 21.87 kg/m. Gen: WD/WN, NAD Head: [place]/AT, No temporalis wasting.  Ear/Nose/Throat: Patient is hard of hearing, nares w/o erythema or drainage Eyes: PER, EOMI, sclera nonicteric.  Neck: Supple, no masses.  No bruit or JVD.  Pulmonary:  Good air movement, no audible wheezing, no use of accessory muscles.  Cardiac: RRR, normal S1, S2, no Murmurs. Vascular:  mild trophic changes, no open wounds Vessel Right Left  Radial Palpable Palpable  Gastrointestinal: soft, non-distended. No guarding/no peritoneal signs.  Musculoskeletal: M/S 5/5 throughout.  No visible deformity.  Neurologic: CN 2-12 intact. Pain and light touch intact in extremities.  Symmetrical.  Speech is fluent. Motor exam as listed above. Psychiatric: Judgment intact, Mood & affect appropriate for pt's clinical situation. Dermatologic: No rashes or ulcers noted.  No changes consistent with cellulitis.   CBC Lab Results  Component Value Date   WBC 6.0 05/01/2023   HGB 11.3 (L) 05/01/2023   HCT 35.8 (L) 05/01/2023   MCV 87.2 05/01/2023   PLT 228 05/01/2023    BMET    Component Value Date/Time   NA 136 05/01/2023 1326   NA 140 04/10/2023  1521   NA 143 07/16/2012 0434   K 4.0 05/01/2023 1326   K 3.9 07/16/2012 0434   CL 103 05/01/2023 1326   CL 109 (H) 07/16/2012 0434   CO2 23 05/01/2023 1326   CO2 32 07/16/2012 0434   GLUCOSE 119 (H) 05/01/2023 1326   GLUCOSE 92 07/16/2012 0434   BUN 23 05/01/2023 1326  BUN 20 04/10/2023 1521   BUN 7 07/16/2012 0434   CREATININE 0.90 05/01/2023 1326   CREATININE 0.66 07/16/2012 0434   CALCIUM 8.4 (L) 05/01/2023 1326   CALCIUM 8.0 (L) 07/16/2012 0434   GFRNONAA 59 (L) 05/01/2023 1326   GFRNONAA >60 07/16/2012 0434   GFRAA 83 11/08/2020 1425   GFRAA >60 07/16/2012 0434   Estimated Creatinine Clearance: 26.9 mL/min (by C-G formula based on SCr of 0.9 mg/dL).  COAG Lab Results  Component Value Date   INR 1.0 07/05/2012    Radiology CT ANGIO GI BLEED  Result Date: 05/01/2023 CLINICAL DATA:  GI bleed. EXAM: CTA ABDOMEN AND PELVIS WITHOUT AND WITH CONTRAST TECHNIQUE: Multidetector CT imaging of the abdomen and pelvis was performed using the standard protocol during bolus administration of intravenous contrast. Multiplanar reconstructed images and MIPs were obtained and reviewed to evaluate the vascular anatomy. RADIATION DOSE REDUCTION: This exam was performed according to the departmental dose-optimization program which includes automated exposure control, adjustment of the mA and/or kV according to patient size and/or use of iterative reconstruction technique. CONTRAST:  OMNIPAQUE IOHEXOL 350 MG/ML SOLN COMPARISON:  CT scan 2021 FINDINGS: VASCULAR Aorta: Tortuosity, ectasia and calcification but no focal aneurysm dissection. Celiac: Mild ostial calcifications but no significant stenosis, aneurysm or dissection. SMA: Moderate ostial calcifications but no significant stenosis, aneurysm or dissection. Renals: Bilateral ostial calcifications but no significant stenosis or dissection. IMA: Patent Inflow: Atherosclerotic calcifications but no stenosis or dissection. Proximal Outflow:  Mild changes of FMD involving the iliac arteries but no aneurysm dissection. Veins: No significant findings. Review of the MIP images confirms the above findings. NON-VASCULAR Lower chest: No infiltrates or effusions. Mild cardiac enlargement. Descending thoracic aortic calcifications and coronary artery calcifications. Hepatobiliary: No hepatic lesions. Mild intra and extrahepatic biliary dilatation likely due to prior cholecystectomy. Pancreas: No mass, inflammation or ductal dilatation. Spleen: Normal size.  No focal lesions. Adrenals/Urinary Tract: Adrenal glands and kidneys unremarkable. Bladder is largely decompressed but no abnormality. Stomach/Bowel: Evidence of active extravasation of contrast in the mid sigmoid colon which becomes more pronounced on the portal venous phase imaging study. No other sites of active contrast extravasation are identified. There is severe diffuse colonic diverticulosis. Lymphatic: No abdominal or pelvic lymphadenopathy. Reproductive: No significant findings. Other: No free abdominal/free pelvic fluid collections or free air. Musculoskeletal: Age related degenerative changes but no bone lesions or fractures. IMPRESSION: 1. Active GI bleed in the mid sigmoid colon. 2. Severe diffuse colonic diverticulosis. 3. Aortic atherosclerosis without aneurysm or dissection. These results will be called to the ordering clinician or representative by the Radiologist Assistant, and communication documented in the PACS or [employer]. Aortic Atherosclerosis (ICD10-I70.0). Electronically Signed   By: Rudie Meyer M.D.   On: 05/01/2023 18:31     Assessment/Plan 1.  Lower GI bleed: At the present time the patient is hemodynamically stable.  I would encourage conservative therapy given her age and frail status.  Embolization could be used if her bleeding is persistent or increases.  However, given her age and diffuse atherosclerotic changes she is at increased risk for ischemic conversion  from an embolization.  I will continue to follow but do not feel that intervention is indicated at this time.  2.  Hypertension: Hold antihypertensive medications and monitor blood pressure.  If her pressure does not increase then treatment is warranted to minimize bleeding.  However, if she continues to bleed having a significant amount of antihypertensives on board could exacerbate hypotension.  3.  Hyperlipidemia: Continue statin as ordered and reviewed, no changes at this time.  4.  Osteoarthritis: Discontinue NSAID medications especially aspirin based ones such as Goody's powder as this will only add to the bleeding.  It should be noted that this already has been done at the time of admission.  Once bleeding is resolved physical therapy will be helpful to maintain the patient's baseline activity level  Levora Dredge, MD  05/01/2023 7:26 PM

## 2023-05-01 NOTE — Progress Notes (Signed)
Got a call from CT, it shows active bleed in mid sigmoid colon. MD made aware.

## 2023-05-01 NOTE — Progress Notes (Signed)
Patient received from CT  via bed in stable condition. 

## 2023-05-01 NOTE — Telephone Encounter (Signed)
  Chief Complaint: large blood clots in toilet  Symptoms: per patient granddaughter, patient had "large blow out" and noted large dark red blood clots in toilet and bright red blood on toilet seat. Unsure of how long patient may have been passing blood.  Frequency: today  Pertinent Negatives: Patient denies chest pain no difficulty breathing reported. No dizziness no abdominal pain  no clammy skin no constipation or diarrhea. Not take any blood thinners Disposition: [x] ED /[] Urgent Care (no appt availability in office) / [] Appointment(In office/virtual)/ []  [facility]/ [] Home Care/ [] Refused Recommended Disposition /[] [place] [facility]/ []  Follow-up with PCP Additional Notes:   Recommended to go to ED now. Granddaughter reports she is not sure she will be able to talk patient into going. Please advise.     Reason for Disposition  SEVERE rectal bleeding (large blood clots; constant or on and off bleeding)  Answer Assessment - Initial Assessment Questions 1. APPEARANCE of BLOOD: "What color is it?" "Is it passed separately, on the surface of the stool, or mixed in with the stool?"      Blood clots in commode dark in color and some bright red blood noted on toilet seat 2. AMOUNT: "How much blood was passed?"      Not whole commode  3. FREQUENCY: "How many times has blood been passed with the stools?"      Not sure per granddaughter but did notice today  4. ONSET: "When was the blood first seen in the stools?" (Days or weeks)      Today now 5. DIARRHEA: "Is there also some diarrhea?" If Yes, ask: "How many diarrhea stools in the past 24 hours?"      na 6. CONSTIPATION: "Do you have constipation?" If Yes, ask: "How bad is it?"     na 7. RECURRENT SYMPTOMS: "Have you had blood in your stools before?" If Yes, ask: "When was the last time?" and "What happened that time?"      probably 8. BLOOD THINNERS: "Do you take any blood thinners?" (e.g., Coumadin/warfarin,  Pradaxa/dabigatran, aspirin)     na 9. OTHER SYMPTOMS: "Do you have any other symptoms?"  (e.g., abdomen pain, vomiting, dizziness, fever)     Started noting blood on panties and today noted large blood clots noted in toilet 10. PREGNANCY: "Is there any chance you are pregnant?" "When was your last menstrual period?"       na  Protocols used: Rectal Bleeding-A-AH

## 2023-05-01 NOTE — Assessment & Plan Note (Addendum)
Recurrent lower GI bleeding x 1 to 2 days Bright red blood per rectum Hemoglobin stable at 10.9 Noted admission November 2021 for similar issues ?  Intermittent NSAID/antiplatelet use with BC powders at home Discussed avoidance Will formally consult Dr. Norma Fredrickson for evaluation GI CTA pending  Trend hemoglobin Transfuse for hemoglobin less than 7 Follow closely

## 2023-05-02 ENCOUNTER — Telehealth: Payer: Self-pay | Admitting: Nurse Practitioner

## 2023-05-02 DIAGNOSIS — K922 Gastrointestinal hemorrhage, unspecified: Secondary | ICD-10-CM | POA: Diagnosis not present

## 2023-05-02 DIAGNOSIS — Z7189 Other specified counseling: Secondary | ICD-10-CM | POA: Diagnosis not present

## 2023-05-02 LAB — COMPREHENSIVE METABOLIC PANEL
ALT: 7 U/L (ref 0–44)
AST: 15 U/L (ref 15–41)
Albumin: 2.5 g/dL — ABNORMAL LOW (ref 3.5–5.0)
Alkaline Phosphatase: 36 U/L — ABNORMAL LOW (ref 38–126)
Anion gap: 5 (ref 5–15)
BUN: 24 mg/dL — ABNORMAL HIGH (ref 8–23)
CO2: 24 mmol/L (ref 22–32)
Calcium: 8.2 mg/dL — ABNORMAL LOW (ref 8.9–10.3)
Chloride: 110 mmol/L (ref 98–111)
Creatinine, Ser: 0.77 mg/dL (ref 0.44–1.00)
GFR, Estimated: >60 mL/min (ref >60)
Glucose, Bld: 107 mg/dL — ABNORMAL HIGH (ref 70–99)
Potassium: 4 mmol/L (ref 3.5–5.1)
Sodium: 139 mmol/L (ref 135–145)
Total Bilirubin: 0.3 mg/dL (ref 0.3–1.2)
Total Protein: 5.6 g/dL — ABNORMAL LOW (ref 6.5–8.1)

## 2023-05-02 LAB — BPAM RBC
Blood Product Expiration Date: 202407162359
Unit Type and Rh: 6200

## 2023-05-02 LAB — TYPE AND SCREEN: Unit division: 0

## 2023-05-02 LAB — CBC
HCT: 24.9 % — ABNORMAL LOW (ref 36.0–46.0)
Hemoglobin: 7.8 g/dL — ABNORMAL LOW (ref 12.0–15.0)
MCH: 27.2 pg (ref 26.0–34.0)
MCHC: 31.3 g/dL (ref 30.0–36.0)
MCV: 86.8 fL (ref 80.0–100.0)
Platelets: 183 10*3/uL (ref 150–400)
RBC: 2.87 MIL/uL — ABNORMAL LOW (ref 3.87–5.11)
RDW: 16.5 % — ABNORMAL HIGH (ref 11.5–15.5)
WBC: 4.7 10*3/uL (ref 4.0–10.5)
nRBC: 0 % (ref 0.0–0.2)

## 2023-05-02 LAB — HEMOGLOBIN AND HEMATOCRIT, BLOOD
HCT: 28.9 % — ABNORMAL LOW (ref 36.0–46.0)
Hemoglobin: 9.2 g/dL — ABNORMAL LOW (ref 12.0–15.0)

## 2023-05-02 LAB — PHOSPHORUS: Phosphorus: 3.3 mg/dL (ref 2.5–4.6)

## 2023-05-02 LAB — MAGNESIUM: Magnesium: 2.1 mg/dL (ref 1.7–2.4)

## 2023-05-02 LAB — PREPARE RBC (CROSSMATCH)

## 2023-05-02 MED ORDER — ACETAMINOPHEN 325 MG PO TABS
650.0000 mg | ORAL_TABLET | Freq: Four times a day (QID) | ORAL | Status: DC | PRN
  Administered 2023-05-05: 650 mg via ORAL
  Filled 2023-05-02: qty 2

## 2023-05-02 MED ORDER — SODIUM CHLORIDE 0.9% IV SOLUTION: Freq: Once | INTRAVENOUS | Status: AC

## 2023-05-02 MED ORDER — PANTOPRAZOLE SODIUM 40 MG PO TBEC
40.0000 mg | DELAYED_RELEASE_TABLET | Freq: Every day | ORAL | Status: DC
  Administered 2023-05-02 – 2023-05-05 (×4): 40 mg via ORAL
  Filled 2023-05-02 (×4): qty 1

## 2023-05-02 MED ORDER — BENAZEPRIL HCL 5 MG PO TABS
5.0000 mg | ORAL_TABLET | Freq: Every day | ORAL | Status: DC
  Administered 2023-05-02 – 2023-05-05 (×3): 5 mg via ORAL
  Filled 2023-05-02 (×4): qty 1

## 2023-05-02 MED ORDER — ONDANSETRON HCL 4 MG/2ML IJ SOLN: 4.0000 mg | Freq: Four times a day (QID) | INTRAMUSCULAR | Status: DC | PRN

## 2023-05-02 NOTE — Telephone Encounter (Signed)
Copied from CRM [id number]. Topic: General - Inquiry >> May 02, 2023  8:44 AM De Blanch wrote: Reason for CRM: Pt daughter stated the patient is in the hospital and has been bleeding since yesterday. She was not on an IV when she left her last night. Daughter mentioned she is concerned they will send her home and wanted to ask Jolene, if she could check on her and her care, she is afraid she will be sent home.Stated she wants to make sure they put blood back in her.    Pt daughter requested a callback. Please advise.

## 2023-05-02 NOTE — Telephone Encounter (Signed)
Copied from CRM #[id number]. Topic: General - Inquiry >> May 02, 2023  8:44 AM Alondra M wrote: Reason for CRM: Pt daughter stated the patient is in the hospital and has been bleeding since yesterday. She was not on an IV when she left her last night. Daughter mentioned she is concerned they will send her home and wanted to ask Jolene, if she could check on her and her care, she is afraid she will be sent home.Stated she wants to make sure they put blood back in her.    Pt daughter requested a callback. Please advise. 

## 2023-05-02 NOTE — Telephone Encounter (Signed)
Patient was admitted to Hospital yesterday afternoon

## 2023-05-02 NOTE — Evaluation (Signed)
Occupational Therapy Evaluation Patient Details Name: Rhonda Ingram MRN: [id number] DOB: [birth year] Today's Date: 05/02/2023   History of Present Illness Rhonda Ingram is a 89 y.o. female with medical history significant of hyperlipidemia, hypertension presenting with lower GI bleeding.  Patient reports recurrent rectal bleeding over the past 1 to 2 days.   Clinical Impression   Chart reviewed, pt greeted in room with grand daughter present. Pt is alert and oriented x4, HOH, agreeable to OT evaluation. PTA pt is generally MOD I in ADL, intermittent assist from grand daughter. Pt requires some assist for IADL however will do dishes and prep simple meals. Pt presents with deficits in strength, endurance, activity tolerance, balance affecting safe and optimal ADL completion. Pt and grand daughter confirm pt is mildly weaker than baseline. Pt would benefit from acute and post acute OT to address functional deficits. OT will continue to follow acutely.      Recommendations for follow up therapy are one component of a multi-disciplinary discharge planning process, led by the attending physician.  Recommendations may be updated based on patient status, additional functional criteria and insurance authorization.   Assistance Recommended at Discharge Intermittent Supervision/Assistance  Patient can return home with the following A little help with bathing/dressing/bathroom;Help with stairs or ramp for entrance    Functional Status Assessment  Patient has had a recent decline in their functional status and demonstrates the ability to make significant improvements in function in a reasonable and predictable amount of time.  Equipment Recommendations  BSC/3in1;Other (comment) (RW)    Recommendations for Other Services       Precautions / Restrictions Precautions Precautions: Fall Restrictions Weight Bearing Restrictions: No      Mobility Bed Mobility               General bed  mobility comments: NT in recliner pre/post session    Transfers Overall transfer level: Needs assistance Equipment used: Rolling walker (2 wheels) Transfers: Bed to chair/wheelchair/BSC, Sit to/from Stand Sit to Stand: Min guard     Step pivot transfers: Min guard     General transfer comment: intermittent vcs for technique      Balance Overall balance assessment: Needs assistance Sitting-balance support: Feet supported Sitting balance-Leahy Scale: Good     Standing balance support: Bilateral upper extremity supported, During functional activity Standing balance-Leahy Scale: Fair                             ADL either performed or assessed with clinical judgement   ADL Overall ADL's : Needs assistance/impaired Eating/Feeding: Set up;Sitting   Grooming: Wash/dry hands;Sitting;Set up               Lower Body Dressing: Minimal assistance Lower Body Dressing Details (indicate cue type and reason): anticipated Toilet Transfer: Min guard;BSC/3in1;Rolling walker (2 wheels) Toilet Transfer Details (indicate cue type and reason): step pivot to bsc due to urgency Toileting- Clothing Manipulation and Hygiene: Moderate assistance;Sit to/from stand Toileting - Clothing Manipulation Details (indicate cue type and reason): pt limited by IV, anticipate CGA without lines/leads     Functional mobility during ADLs: Min guard;Rolling walker (2 wheels) (approx 5' in room)       Vision Patient Visual Report: No change from baseline       Perception     Praxis      Pertinent Vitals/Pain Pain Assessment Pain Assessment: No/denies pain     Hand Dominance Right   Extremity/Trunk  Assessment Upper Extremity Assessment Upper Extremity Assessment: Generalized weakness   Lower Extremity Assessment Lower Extremity Assessment: Generalized weakness   Cervical / Trunk Assessment Cervical / Trunk Assessment: Normal   Communication Communication Communication:  HOH   Cognition Arousal/Alertness: Awake/alert Behavior During Therapy: WFL for tasks assessed/performed Overall Cognitive Status: Within Functional Limits for tasks assessed                                       General Comments  vss throughout, all lines/leads intact pre/post session    Exercises Other Exercises Other Exercises: edu re: role of OT, role of rehab, further therapy recommendations, DME use   Shoulder Instructions      Home Living Family/patient expects to be discharged to:: Private residence Living Arrangements: Other relatives (grand daughter) Available Help at Discharge: Family Type of Home: House Home Access: Ramped entrance;Stairs to enter     Home Layout: One level     Bathroom Shower/Tub: Chief Strategy Officer: Standard Bathroom Accessibility: Yes   Home Equipment: Agricultural consultant (2 wheels);BSC/3in1;Hand held shower head   Additional Comments: pt family reports they have equipment, however unsure of age of equipment, pt has not ever personally used      Prior Functioning/Environment Prior Level of Function : Independent/Modified Independent;Needs assist             Mobility Comments: amb with no AD PTA, no falls reported ADLs Comments: generally MOD I with feeding, dressing, grooming, toileting; supervision for bathing; assist for IADLS however will do dishes and prep food as needed        OT Problem List: Decreased strength;Decreased activity tolerance;Decreased knowledge of use of DME or AE      OT Treatment/Interventions: Self-care/ADL training;Therapeutic exercise;DME and/or AE instruction;Therapeutic activities;Balance training;Energy conservation;Patient/family education    OT Goals(Current goals can be found in the care plan section) Acute Rehab OT Goals Patient Stated Goal: go home OT Goal Formulation: With patient/family Time For Goal Achievement: 05/16/23 Potential to Achieve Goals: Good ADL  Goals Pt Will Perform Grooming: with modified independence;sitting;standing Pt Will Perform Lower Body Dressing: with modified independence Pt Will Transfer to Toilet: with modified independence;ambulating Pt Will Perform Toileting - Clothing Manipulation and hygiene: with modified independence;sit to/from stand  OT Frequency: Min 2X/week    Co-evaluation              AM-PAC OT "6 Clicks" Daily Activity     Outcome Measure Help from another person eating meals?: None Help from another person taking care of personal grooming?: None Help from another person toileting, which includes using toliet, bedpan, or urinal?: A Lot Help from another person bathing (including washing, rinsing, drying)?: A Little Help from another person to put on and taking off regular upper body clothing?: A Little Help from another person to put on and taking off regular lower body clothing?: A Little 6 Click Score: 19   End of Session Equipment Utilized During Treatment: Rolling walker (2 wheels) Nurse Communication: Mobility status  Activity Tolerance: Patient tolerated treatment well Patient left: in chair;with call bell/phone within reach;with chair alarm set;with family/visitor present  OT Visit Diagnosis: Unsteadiness on feet (R26.81);Muscle weakness (generalized) (M62.81)                Time: 1610-9604 OT Time Calculation (min): 22 min Charges:  OT General Charges $OT Visit: 1 Visit OT Evaluation $OT Eval Moderate  Complexity: 1 Mod Rhonda Ingram, OTD OTR/L  05/02/23, 4:18 PM

## 2023-05-02 NOTE — Progress Notes (Signed)
   05/02/23 1500  Spiritual Encounters  Type of Visit Initial  Care provided to: Pt and family  Referral source Family  Reason for visit Advance directives  OnCall Visit No  Spiritual Framework  Presenting Themes Community and relationships  Patient Stress Factors Major life changes  Family Stress Factors Major life changes  Interventions  Spiritual Care Interventions Made Established relationship of care and support;Compassionate presence;Reflective listening;Encouragement  Spiritual Care Plan  Spiritual Care Issues Still Outstanding No further spiritual care needs at this time (see row info)   Spoke with patient and their family about advance directive. Trying to complete an advance directive.  Was able to get witness but was not able to secure an notary. Educated the family on the process of next to kin when comes to medical decisions if patient unable to communicate with medical staff.

## 2023-05-02 NOTE — Telephone Encounter (Signed)
Noted  

## 2023-05-02 NOTE — Telephone Encounter (Signed)
Patient made aware of Provider's recommendations and verbalized understanding.   

## 2023-05-02 NOTE — Progress Notes (Signed)
[facility] Progress Note  Patient: Rhonda Ingram    WUJ:[id number]  DOA: 05/01/2023     Date of Service: the patient was seen and examined on 05/02/2023  Chief Complaint  Patient presents with   Rectal Bleeding   Brief hospital course: Rhonda Ingram is a 89 y.o. female with medical history significant of hyperlipidemia, hypertension presenting with lower GI bleeding.  Patient reports recurrent rectal bleeding over the past 1 to 2 days.  No fevers or chills.  No nausea or vomiting.  Minimal to mild abdominal pain.  Noted similar issue requiring admission November 2021. Patient denies any recent blood thinner use, though she does report some ? Recent NSAID use w/ goody powder. No CP or SOB. No hemiparesis or confusion.  Presented to ER afebrile, hemodynamically stable. WBC 6, hgb 11, plt 230s. Cr 0.9.    Assessment and Plan:  # Lower GI bleeding and acute blood loss anemia Hb 7.8, transfuse 1 unit of PRBC Monitor H&H and transfuse as needed GI consulted, recommended CTA CTA: 1. Active GI bleed in the mid sigmoid colon. 2. Severe diffuse colonic diverticulosis. 3. Aortic atherosclerosis without aneurysm or dissection. Vascular surgery was consulted, recommended conservative management no intervention Palliative care consulted for goals of care discussion.   # Iron deficiency, transferrin saturation 12%  resume oral iron supplement on discharge   # Hypertension Resumed benazepril 5 mg p.o. daily home dose Monitor BP and titrate medications accordingly   Generalized weakness, fatigue and tired could be due to decreased oral intake Malnutrition mild to moderate Body mass index is 21.87 kg/m.  Interventions: Started Ensure protein supplement  Diet: NPO DVT Prophylaxis: SCD, pharmacological prophylaxis contraindicated due to GI bleeding    Advance goals of care discussion: Full code  Family Communication: family was not present at bedside, at the time of interview.   The pt provided permission to discuss medical plan with the family. Opportunity was given to ask question and all questions were answered satisfactorily.   Disposition:  Pt is from Home, admitted with GI bleeding, still has GI bleeding, which precludes a safe discharge. Discharge to Home, when stable.  Subjective: No significant events overnight, patient is still having lower GI bleeding, denies any abdominal pain.  Denies any nausea vomiting.  Denies any chest pain or palpitation, no shortness of breath. Patient agreed for blood transfusion.  Physical Exam: General: NAD, lying comfortably Appear in no distress, affect appropriate Eyes: PERRLA ENT: Oral Mucosa Clear, moist  Neck: no JVD,  Cardiovascular: S1 and S2 Present, no Murmur,  Respiratory: good respiratory effort, Bilateral Air entry equal and Decreased, no Crackles, no wheezes Abdomen: Bowel Sound present, Soft and no tenderness,  Skin: no rashes Extremities: no Pedal edema, no calf tenderness Neurologic: without any new focal findings Gait not checked due to patient safety concerns  Vitals:   05/02/23 0503 05/02/23 0726 05/02/23 1140 05/02/23 1212  BP: (!) 143/57 (!) 144/78 128/67 113/63  Pulse: 64 60 65 66  Resp: 14 16 18 18   Temp: 98.1 F (36.7 C) 98.5 F (36.9 C) 98.3 F (36.8 C) 98.5 F (36.9 C)  TempSrc: Oral  Oral   SpO2: 100% 100% 100% 100%  Weight:      Height:       No intake or output data in the 24 hours ending 05/02/23 1336 Filed Weights   05/01/23 1323  Weight: 50.8 kg    Data Reviewed: I have personally reviewed and interpreted daily labs, tele strips,  imagings as discussed above. I reviewed all nursing notes, pharmacy notes, vitals, pertinent old records I have discussed plan of care as described above with RN and patient/family.  CBC: Recent Labs  Lab 05/01/23 1326 05/01/23 1547 05/02/23 0917  WBC 6.0  --  4.7  HGB 10.9* 11.3* 7.8*  HCT 35.4* 35.8* 24.9*  MCV 87.2  --  86.8  PLT  228  --  183   Basic Metabolic Panel: Recent Labs  Lab 05/01/23 1326 05/02/23 0642 05/02/23 0917  NA 136 139  --   K 4.0 4.0  --   CL 103 110  --   CO2 23 24  --   GLUCOSE 119* 107*  --   BUN 23 24*  --   CREATININE 0.90 0.77  --   CALCIUM 8.4* 8.2*  --   MG  --   --  2.1  PHOS  --   --  3.3    Studies: CT ANGIO GI BLEED  Result Date: 05/01/2023 CLINICAL DATA:  GI bleed. EXAM: CTA ABDOMEN AND PELVIS WITHOUT AND WITH CONTRAST TECHNIQUE: Multidetector CT imaging of the abdomen and pelvis was performed using the standard protocol during bolus administration of intravenous contrast. Multiplanar reconstructed images and MIPs were obtained and reviewed to evaluate the vascular anatomy. RADIATION DOSE REDUCTION: This exam was performed according to the departmental dose-optimization program which includes automated exposure control, adjustment of the mA and/or kV according to patient size and/or use of iterative reconstruction technique. CONTRAST:  OMNIPAQUE IOHEXOL 350 MG/ML SOLN COMPARISON:  CT scan 2021 FINDINGS: VASCULAR Aorta: Tortuosity, ectasia and calcification but no focal aneurysm dissection. Celiac: Mild ostial calcifications but no significant stenosis, aneurysm or dissection. SMA: Moderate ostial calcifications but no significant stenosis, aneurysm or dissection. Renals: Bilateral ostial calcifications but no significant stenosis or dissection. IMA: Patent Inflow: Atherosclerotic calcifications but no stenosis or dissection. Proximal Outflow: Mild changes of FMD involving the iliac arteries but no aneurysm dissection. Veins: No significant findings. Review of the MIP images confirms the above findings. NON-VASCULAR Lower chest: No infiltrates or effusions. Mild cardiac enlargement. Descending thoracic aortic calcifications and coronary artery calcifications. Hepatobiliary: No hepatic lesions. Mild intra and extrahepatic biliary dilatation likely due to prior cholecystectomy.  Pancreas: No mass, inflammation or ductal dilatation. Spleen: Normal size.  No focal lesions. Adrenals/Urinary Tract: Adrenal glands and kidneys unremarkable. Bladder is largely decompressed but no abnormality. Stomach/Bowel: Evidence of active extravasation of contrast in the mid sigmoid colon which becomes more pronounced on the portal venous phase imaging study. No other sites of active contrast extravasation are identified. There is severe diffuse colonic diverticulosis. Lymphatic: No abdominal or pelvic lymphadenopathy. Reproductive: No significant findings. Other: No free abdominal/free pelvic fluid collections or free air. Musculoskeletal: Age related degenerative changes but no bone lesions or fractures. IMPRESSION: 1. Active GI bleed in the mid sigmoid colon. 2. Severe diffuse colonic diverticulosis. 3. Aortic atherosclerosis without aneurysm or dissection. These results will be called to the ordering clinician or representative by the Radiologist Assistant, and communication documented in the PACS or [employer]. Aortic Atherosclerosis (ICD10-I70.0). Electronically Signed   By: Rudie Meyer M.D.   On: 05/01/2023 18:31    Scheduled Meds:  benazepril  5 mg Oral Daily   pantoprazole  40 mg Oral Daily   Continuous Infusions:  sodium chloride 75 mL/hr at 05/02/23 1012   PRN Meds: acetaminophen, ondansetron (ZOFRAN) IV  Time spent: 55 minutes  Author: Gillis Santa. MD [facility] 05/02/2023 1:36 PM  To reach On-call, see care teams to locate the attending and reach out to them via [email or web address]. If 7PM-7AM, please contact night-coverage If you still have difficulty reaching the attending provider, please page the [facility] (Director on Call) for [facility] on amion for assistance.

## 2023-05-02 NOTE — Consult Note (Addendum)
Consultation Note Date: 05/02/2023   Patient Name: Rhonda Ingram  DOB: [birth year]  MRN: [id number]  Age / Sex: 89 y.o., female  PCP: Marjie Skiff, NP Referring Physician: Gillis Santa, MD  Reason for Consultation: Establishing goals of care  HPI/Patient Profile: Rhonda Ingram is a 89 y.o. female with medical history significant of hyperlipidemia, hypertension presenting with lower GI bleeding.   Clinical Assessment and Goals of Care: Notes and labs reviewed.  Patient is sitting in bedside chair, she is hard of hearing but with speaking loudly is alert and oriented.  She is able to tell me her name, that we are at Rhonda Ingram, and is currently here because of rectal bleeding, the year of 2022, and is able to tell me who her granddaughter is at bedside, and that she lives with her.  She has 2 daughters, one of which has CP and lives at [facility] facility.   We discussed her diagnosis, prognosis, GOC, EOL wishes disposition and options.  Created space and opportunity for patient  to explore thoughts and feelings regarding current medical information.   A detailed discussion was had today regarding advanced directives.  Concepts specific to code status, artifical feeding and hydration, IV antibiotics and rehospitalization were discussed.  The difference between an aggressive medical intervention path and a comfort care path was discussed.  Values and goals of care important to patient and family were attempted to be elicited.  Discussed limitations of medical interventions to prolong quality of life in some situations and discussed the concept of human mortality.  She states she is considering care moving forward.  She states that she understands that if she does not have the procedure, she could die.     She states keeping her mental faculties are most important to her, and is the  essence of QOL.  She states that she would never want CPR and would never want to be put on the ventilator.  She states she would not want dialysis and would never want a feeding tube.  She is unsure whether she would want to have surgery or not. A MOST form was completed.    She does state that she would want her granddaughter to be her surrogate decision maker, and her daughter Rhonda Ingram to be secondary Management consultant. AD packet provided to family. Request for spiritual care to facilitate notarization made via secretary.   Daughter entered into the conversation and we discussed the above decisions and daughter confirms that her mother has always said these things.  I completed a MOST form today with patient, and the signed original was placed in the chart. The form was scanned and sent to medical records for it to be uploaded under ACP tab in Epic. A photocopy was also placed in the chart to be scanned into EMR. The patient outlined their wishes for the following treatment decisions:  Cardiopulmonary Resuscitation: Do Not Attempt Resuscitation (DNR/No CPR)  Medical Interventions: Limited Additional Interventions: Use medical treatment, IV fluids and cardiac monitoring  as indicated, DO NOT USE intubation or mechanical ventilation. May consider use of less invasive airway support such as BiPAP or CPAP. Also provide comfort measures. Transfer to the [facility] if indicated. Avoid intensive care.   Antibiotics: Antibiotics if indicated  IV Fluids: IV fluids if indicated  Feeding Tube: No feeding tube    SUMMARY OF RECOMMENDATIONS   PMT will follow.   Prognosis:  Unable to determine      Primary Diagnoses: Present on Admission:  GIB (gastrointestinal bleeding)  Hypertension   I have reviewed the medical record, interviewed the patient and family, and examined the patient. The following aspects are pertinent.  Past Medical History:  Diagnosis Date   Arthritis    osteoarthritis    Hyperlipidemia    Hypertension    Osteoporosis    Vitamin D deficiency    Social History   Socioeconomic History   Marital status: Divorced    Spouse name: Not on file   Number of children: Not on file   Years of education: Not on file   Highest education level: 7th grade  Occupational History   Occupation: retired   Tobacco Use   Smoking status: Never   Smokeless tobacco: Never  Vaping Use   Vaping Use: Never used  Substance and Sexual Activity   Alcohol use: No   Drug use: No   Sexual activity: Never  Other Topics Concern   Not on file  Social History Narrative   Not on file   Social Determinants of Health   Financial Resource Strain: Low Risk  (04/10/2023)   Overall Financial Resource Strain (CARDIA)    Difficulty of Paying Living Expenses: Not hard at all  Food Insecurity: No Food Insecurity (05/01/2023)   Hunger Vital Sign    Worried About Running Out of Food in the Last Year: Never true    Ran Out of Food in the Last Year: Never true  Transportation Needs: No Transportation Needs (05/01/2023)   PRAPARE - Administrator, Civil Service (Medical): No    Lack of Transportation (Non-Medical): No  Physical Activity: Insufficiently Active (04/10/2023)   Exercise Vital Sign    Days of Exercise per Week: 2 days    Minutes of Exercise per Session: 20 min  Stress: No Stress Concern Present (04/10/2023)   [employer] of Occupational Health - Occupational Stress Questionnaire    Feeling of Stress : Not at all  Social Connections: Socially Isolated (04/10/2023)   Social Connection and Isolation Panel [NHANES]    Frequency of Communication with Friends and Family: More than three times a week    Frequency of Social Gatherings with Friends and Family: More than three times a week    Attends Religious Services: Never    Database administrator or Organizations: No    Attends Banker Meetings: Never    Marital Status: Widowed   Family History   Problem Relation Age of Onset   Hypertension Mother    Stroke Mother    Cancer Father        lung   Hypertension Sister    Hyperlipidemia Sister    Hypertension Daughter    Cerebral palsy Daughter    Hypertension Maternal Grandmother    Hypertension Maternal Grandfather    Hypertension Paternal Grandmother    Hypertension Paternal Grandfather    Hypertension Sister    Hyperlipidemia Sister    Migraines Daughter    Diabetes Brother    Hypertension Brother  Hyperlipidemia Brother    Scheduled Meds:  benazepril  5 mg Oral Daily   pantoprazole  40 mg Oral Daily   Continuous Infusions:  sodium chloride 75 mL/hr at 05/02/23 1012   PRN Meds:.acetaminophen, ondansetron (ZOFRAN) IV Medications Prior to Admission:  Prior to Admission medications   Medication Sig Start Date End Date Taking? Authorizing Provider  benazepril (LOTENSIN) 5 MG tablet Take 1 tablet (5 mg total) by mouth daily. 04/02/23  Yes [name], Jolene T, NP  ferrous sulfate 324 MG TBEC Take 324 mg by mouth.   Yes [provider]  magnesium oxide (MAG-OX) 400 MG tablet Take 400 mg by mouth at bedtime.   Yes [provider]  VITAMIN D PO Take 2,000 Units by mouth daily.   Yes [provider]  triamcinolone cream (KENALOG) 0.1 % Apply 1 Application topically 2 (two) times daily. Patient not taking: Reported on 05/01/2023 06/06/22   Marjie Skiff, NP   Allergies  Allergen Reactions   Alpha-D-Galactosidase Itching and Shortness Of Breath   Meat Extract Shortness Of Breath   Other     Alpha-gal allergy (meat allergy or Mammalian Meat Allergy) beef/red meat/pork   Review of Systems  Gastrointestinal:  Positive for anal bleeding.    Physical Exam Pulmonary:     Effort: Pulmonary effort is normal.  Neurological:     Mental Status: She is alert.     Vital Signs: BP 113/63 (BP Location: Left Arm)   Pulse 66   Temp 98.5 F (36.9 C)   Resp 18   Ht 5' (1.524 m)   Wt 50.8 kg   LMP   (LMP Unknown)   SpO2 100%   BMI 21.87 kg/m  Pain Scale: 0-10   Pain Score: 0-No pain   SpO2: SpO2: 100 % O2 Device:SpO2: 100 % O2 Flow Rate: .   IO: Intake/output summary: No intake or output data in the 24 hours ending 05/02/23 1525  LBM: Last BM Date : 05/01/23 Baseline Weight: Weight: 50.8 kg Most recent weight: Weight: 50.8 kg        Signed by: Morton Stall, NP   Please contact Palliative Medicine Team phone at [phone] for questions and concerns.  For individual provider: See Loretha Stapler

## 2023-05-02 NOTE — Evaluation (Signed)
Physical Therapy Evaluation Patient Details Name: Rhonda Ingram MRN: [id number] DOB: 1926/09/02 Today's Date: 05/02/2023  History of Present Illness  Rhonda Ingram is a 89 y.o. female with medical history significant of hyperlipidemia, hypertension presenting with lower GI bleeding.  Patient reports recurrent rectal bleeding over the past 1 to 2 days.  Clinical Impression  Patient received in bed, MD just leaving room on arrival. Patient reports she needs to use bathroom. She is HOH. Patient required mod A for supine to sit. Min guard for transferring to [facility] and to perform sit to stand. Patient found to have blood in commode after urinating. MD and RN notified. Patient is able to ambulate 50 feet with IV pole and min guard. She will continue to benefit from skilled PT to improve functional independence and safety with mobility.            Recommendations for follow up therapy are one component of a multi-disciplinary discharge planning process, led by the attending physician.  Recommendations may be updated based on patient status, additional functional criteria and insurance authorization.  Follow Up Recommendations       Assistance Recommended at Discharge Frequent or constant Supervision/Assistance  Patient can return home with the following  A little help with walking and/or transfers;Assist for transportation;A little help with bathing/dressing/bathroom;Help with stairs or ramp for entrance;Direct supervision/assist for medications management;Direct supervision/assist for financial management;Assistance with cooking/housework    Equipment Recommendations Rolling walker (2 wheels)  Recommendations for Other Services       Functional Status Assessment Patient has had a recent decline in their functional status and demonstrates the ability to make significant improvements in function in a reasonable and predictable amount of time.     Precautions / Restrictions  Precautions Precautions: Fall Restrictions Weight Bearing Restrictions: No      Mobility  Bed Mobility Overal bed mobility: Needs Assistance Bed Mobility: Supine to Sit     Supine to sit: Mod assist          Transfers Overall transfer level: Needs assistance Equipment used: None Transfers: Sit to/from Stand, Bed to chair/wheelchair/BSC Sit to Stand: Min guard Stand pivot transfers: Min guard              Ambulation/Gait Ambulation/Gait assistance: Min guard Gait Distance (Feet): 50 Feet Assistive device: IV Pole Gait Pattern/deviations: Step-through pattern, Narrow base of support, Decreased step length - right, Decreased step length - left, Decreased stride length Gait velocity: decr     General Gait Details: patient ambulating well with minimal assistance on IV pole. Slow, small steps, no lob  Stairs            Wheelchair Mobility    Modified Rankin (Stroke Patients Only)       Balance Overall balance assessment: Needs assistance Sitting-balance support: Feet supported Sitting balance-Leahy Scale: Normal     Standing balance support: Bilateral upper extremity supported, During functional activity Standing balance-Leahy Scale: Fair Standing balance comment: had B UEs on IV pole this session, does not normally use AD per her report                             Pertinent Vitals/Pain Pain Assessment Pain Assessment: No/denies pain    Home Living Family/patient expects to be discharged to:: Private residence Living Arrangements: Other relatives Available Help at Discharge: Family Type of Home: House           Home Equipment: None  Prior Function Prior Level of Function : Independent/Modified Independent             Mobility Comments: was not useing AD prior to admission ADLs Comments: independent but no family present to verify     Hand Dominance   Dominant Hand: Right    Extremity/Trunk Assessment    Upper Extremity Assessment Upper Extremity Assessment: Generalized weakness    Lower Extremity Assessment Lower Extremity Assessment: Generalized weakness    Cervical / Trunk Assessment Cervical / Trunk Assessment: Normal  Communication   Communication: HOH  Cognition Arousal/Alertness: Awake/alert Behavior During Therapy: WFL for tasks assessed/performed Overall Cognitive Status: Within Functional Limits for tasks assessed                                          General Comments      Exercises     Assessment/Plan    PT Assessment Patient needs continued PT services  PT Problem List Decreased strength;Decreased mobility;Decreased balance;Decreased activity tolerance       PT Treatment Interventions DME instruction;Gait training;Therapeutic exercise;Balance training;Stair training;Functional mobility training;Therapeutic activities;Patient/family education    PT Goals (Current goals can be found in the Care Plan section)  Acute Rehab PT Goals Patient Stated Goal: to return home PT Goal Formulation: With patient Time For Goal Achievement: 05/16/23 Potential to Achieve Goals: Good    Frequency Min 3X/week     Co-evaluation               AM-PAC PT "6 Clicks" Mobility  Outcome Measure Help needed turning from your back to your side while in a flat bed without using bedrails?: A Little Help needed moving from lying on your back to sitting on the side of a flat bed without using bedrails?: A Lot Help needed moving to and from a bed to a chair (including a wheelchair)?: A Little Help needed standing up from a chair using your arms (e.g., wheelchair or bedside chair)?: A Little Help needed to walk in hospital room?: A Little Help needed climbing 3-5 steps with a railing? : A Lot 6 Click Score: 16    End of Session   Activity Tolerance: Patient tolerated treatment well;Patient limited by fatigue Patient left: in chair;with call bell/phone  within reach;with chair alarm set Nurse Communication: Mobility status;Other (comment) (continued bleeding when used BSC) PT Visit Diagnosis: Muscle weakness (generalized) (M62.81);Difficulty in walking, not elsewhere classified (R26.2)    Time: 1040-1102 PT Time Calculation (min) (ACUTE ONLY): 22 min   Charges:   PT Evaluation $PT Eval Low Complexity: 1 Low PT Treatments $Gait Training: 8-22 mins        Tayden Duran, PT, GCS 05/02/23,11:12 AM

## 2023-05-02 NOTE — TOC Initial Note (Signed)
Transition of Care [facility]) - Initial/Assessment Note    Patient Details  Name: Rhonda Ingram MRN: [id number] Date of Birth: May 26, 1926  Transition of Care [facility]) CM/SW Contact:    Allena Katz, LCSW Phone Number: 05/02/2023, 4:01 PM  Clinical Narrative:    CSW spoke with pt regarding HH. Pt reports she would like [place] to call her granddaughter christine as she lives with her. Grandaughter reports pt is independent and doesn't feel she needs HH services. Granddaughter reports that she has a RW and BSC at home and will assist her to appointments as needed.  Pt is active with Dr. Harvest Dark for primary care.            Expected Discharge Plan: Home/Self Care     Patient Goals and CMS Choice     Choice offered to / list presented to : Patient      Expected Discharge Plan and Services     Post Acute Care Choice: NA Living arrangements for the past 2 months: Single Family Home                                      Prior Living Arrangements/Services Living arrangements for the past 2 months: Single Family Home Lives with::  Engineer, building services) Patient language and need for interpreter reviewed:: Yes        Need for Family Participation in Patient Care: Yes (Comment)   Current home services: DME    Activities of Daily Living Home Assistive Devices/Equipment: None ADL Screening (condition at time of admission) Patient's cognitive ability adequate to safely complete daily activities?: Yes Is the patient deaf or have difficulty hearing?: Yes Does the patient have difficulty seeing, even when wearing glasses/contacts?: No Does the patient have difficulty concentrating, remembering, or making decisions?: No Does the patient have difficulty dressing or bathing?: No Does the patient have difficulty walking or climbing stairs?: No  Permission Sought/Granted Permission sought to share information with : Family Supports             Permission granted to share info w Contact  Information: grandaughter  Emotional Assessment Appearance:: Well-Groomed     Orientation: : Oriented to Self, Oriented to Place, Oriented to  Time, Oriented to Situation      Admission diagnosis:  GIB (gastrointestinal bleeding) [K92.2] Lower GI bleed [K92.2] Patient Active Problem List   Diagnosis Date Noted   GIB (gastrointestinal bleeding) 05/01/2023   At high risk for falls 02/06/2022   Heart murmur, systolic 02/06/2022   Leg cramps, sleep related 08/09/2021   Osteoarthritis 08/09/2021   Iron deficiency anemia due to chronic blood loss 02/06/2021   Aortic atherosclerosis (HCC) 02/01/2021   Chronic back pain    Diverticulosis 09/21/2020   Vitamin D deficiency 02/01/2020   Senile purpura (HCC) 08/03/2019   Advanced care planning/counseling discussion 03/18/2018   Ventral hernia 09/03/2016   Hypertension 08/02/2015   Hyperlipidemia 08/02/2015   PCP:  Marjie Skiff, NP Pharmacy:   [facility] - [place], [place] - [address] [place] [address] Phone: [phone] Fax: [phone]  [id number] - [place], [place] - [address] [place] [address] Phone: [phone] Fax: [phone]     Social Determinants of Health (SDOH) Social History: SDOH Screenings   Food Insecurity: No Food Insecurity (05/01/2023)  Housing: Patient Declined (05/01/2023)  Transportation Needs: No Transportation Needs (  05/01/2023)  Utilities: Not At Risk (05/01/2023)  Alcohol Screen: Low Risk  (04/10/2023)  Depression (PHQ2-9): Medium Risk (04/10/2023)  Financial Resource Strain: Low Risk  (04/10/2023)  Physical Activity: Insufficiently Active (04/10/2023)  Social Connections: Socially Isolated (04/10/2023)  Stress: No Stress Concern Present (04/10/2023)  Tobacco Use: Low Risk  (05/01/2023)   SDOH Interventions:     Readmission Risk Interventions     No data to display

## 2023-05-02 NOTE — Progress Notes (Signed)
[facility] GI inpatient brief Progress Note  Patient seen for f/u LGI bleed with positive CTA for bleeding in the mid-sigmoid. Patient sleeping restfully. Per RN, patient had persistent, though lower volume bleeding x 2 overnight.  Vitals:   05/02/23 0503 05/02/23 0726  BP: (!) 143/57 (!) 144/78  Pulse: 64 60  Resp: 14 16  Temp: 98.1 F (36.7 C) 98.5 F (36.9 C)  SpO2: 100% 100%       Labs:    Latest Ref Rng & Units 05/01/2023    3:47 PM 05/01/2023    1:26 PM 04/10/2023    3:21 PM  CBC  WBC 4.0 - 10.5 K/uL  6.0  5.0   Hemoglobin 12.0 - 15.0 g/dL 16.1  09.6  04.5   Hematocrit 36.0 - 46.0 % 35.8  35.4  34.3   Platelets 150 - 400 K/uL  228  269     CMP     Component Value Date/Time   NA 139 05/02/2023 0642   NA 140 04/10/2023 1521   NA 143 07/16/2012 0434   K 4.0 05/02/2023 0642   K 3.9 07/16/2012 0434   CL 110 05/02/2023 0642   CL 109 (H) 07/16/2012 0434   CO2 24 05/02/2023 0642   CO2 32 07/16/2012 0434   GLUCOSE 107 (H) 05/02/2023 0642   GLUCOSE 92 07/16/2012 0434   BUN 24 (H) 05/02/2023 0642   BUN 20 04/10/2023 1521   BUN 7 07/16/2012 0434   CREATININE 0.77 05/02/2023 0642   CREATININE 0.66 07/16/2012 0434   CALCIUM 8.2 (L) 05/02/2023 0642   CALCIUM 8.0 (L) 07/16/2012 0434   PROT 5.6 (L) 05/02/2023 0642   PROT 7.0 04/10/2023 1521   PROT 6.2 (L) 07/15/2012 1609   ALBUMIN 2.5 (L) 05/02/2023 0642   ALBUMIN 3.7 04/10/2023 1521   ALBUMIN 2.4 (L) 07/15/2012 1609   AST 15 05/02/2023 0642   AST 28 08/02/2015 1353   AST 15 07/15/2012 1609   ALT 7 05/02/2023 0642   ALT 11 08/02/2015 1353   ALT 10 (L) 07/15/2012 1609   ALKPHOS 36 (L) 05/02/2023 0642   ALKPHOS 61 07/15/2012 1609   BILITOT 0.3 05/02/2023 0642   BILITOT 0.2 04/10/2023 1521   BILITOT 0.2 07/15/2012 1609   GFRNONAA >60 05/02/2023 0642   GFRNONAA >60 07/16/2012 0434   GFRAA 83 11/08/2020 1425   GFRAA >60 07/16/2012 0434      Impression:  Lower GI bleeding ,presumably from sigmoid  diverticulosis. 2.   Anemia - Hgb stable at 11.3 3.   Advanced age 7.   Chronic back pain.   Plan:  Follow H/H 2.   Consider vascular embolization when clinically feasible 3.   Following.  Will follow along with you..   Thank you  T. Bing Plume, M.D. ABIM Diplomate in Gastroenterology [facility] [phone] - Cell

## 2023-05-03 ENCOUNTER — Encounter
Admission: EM | Disposition: A | Discharge status: Home / Self Care | Payer: Self-pay | Source: Home / Self Care | Attending: Student

## 2023-05-03 DIAGNOSIS — K922 Gastrointestinal hemorrhage, unspecified: Secondary | ICD-10-CM | POA: Diagnosis not present

## 2023-05-03 LAB — CBC
HCT: 28.2 % — ABNORMAL LOW (ref 36.0–46.0)
Hemoglobin: 9.1 g/dL — ABNORMAL LOW (ref 12.0–15.0)
MCH: 27.7 pg (ref 26.0–34.0)
MCHC: 32.3 g/dL (ref 30.0–36.0)
MCV: 86 fL (ref 80.0–100.0)
Platelets: 169 10*3/uL (ref 150–400)
RBC: 3.28 MIL/uL — ABNORMAL LOW (ref 3.87–5.11)
RDW: 16.6 % — ABNORMAL HIGH (ref 11.5–15.5)
WBC: 4.1 10*3/uL (ref 4.0–10.5)
nRBC: 0 % (ref 0.0–0.2)

## 2023-05-03 LAB — BPAM RBC: ISSUE DATE / TIME: 202406201151

## 2023-05-03 LAB — HEMOGLOBIN AND HEMATOCRIT, BLOOD
HCT: 31.4 % — ABNORMAL LOW (ref 36.0–46.0)
Hemoglobin: 10 g/dL — ABNORMAL LOW (ref 12.0–15.0)

## 2023-05-03 LAB — PHOSPHORUS: Phosphorus: 2.9 mg/dL (ref 2.5–4.6)

## 2023-05-03 LAB — TYPE AND SCREEN

## 2023-05-03 LAB — BASIC METABOLIC PANEL
Anion gap: 6 (ref 5–15)
BUN: 21 mg/dL (ref 8–23)
CO2: 22 mmol/L (ref 22–32)
Calcium: 8.1 mg/dL — ABNORMAL LOW (ref 8.9–10.3)
Chloride: 109 mmol/L (ref 98–111)
Creatinine, Ser: 0.71 mg/dL (ref 0.44–1.00)
GFR, Estimated: >60 mL/min (ref >60)
Glucose, Bld: 81 mg/dL (ref 70–99)
Potassium: 3.5 mmol/L (ref 3.5–5.1)
Sodium: 137 mmol/L (ref 135–145)

## 2023-05-03 LAB — MAGNESIUM: Magnesium: 2.1 mg/dL (ref 1.7–2.4)

## 2023-05-03 SURGERY — VISCERAL ARTERY INTERVENTION
Anesthesia: Moderate Sedation

## 2023-05-03 NOTE — Progress Notes (Signed)
[facility] Progress Note  Patient: Rhonda Ingram    UEA:[id number]  DOA: 05/01/2023     Date of Service: the patient was seen and examined on 05/03/2023  Chief Complaint  Patient presents with   Rectal Bleeding   Brief hospital course: Rhonda Ingram is a 89 y.o. female with medical history significant of hyperlipidemia, hypertension presenting with lower GI bleeding.  Patient reports recurrent rectal bleeding over the past 1 to 2 days.  No fevers or chills.  No nausea or vomiting.  Minimal to mild abdominal pain.  Noted similar issue requiring admission November 2021. Patient denies any recent blood thinner use, though she does report some ? Recent NSAID use w/ goody powder. No CP or SOB. No hemiparesis or confusion.  Presented to ER afebrile, hemodynamically stable. WBC 6, hgb 11, plt 230s. Cr 0.9.    Assessment and Plan:  # Lower GI bleeding and acute blood loss anemia Hb 7.8, transfuse 1 unit of PRBC, Hb 9.1 posttransfusion Monitor H&H and transfuse as needed GI consulted, recommended CTA CTA: 1. Active GI bleed in the mid sigmoid colon. 2. Severe diffuse colonic diverticulosis. 3. Aortic atherosclerosis without aneurysm or dissection.  Vascular surgery was consulted, recommended conservative management no intervention  Palliative care consulted for goals of care discussion. Pt was made DNR/DNI Patient is still having intermittent GI bleeding, Hb 10.0, remained stable Resumed full liquid diet, we will gradually advance as per improvement in her GI bleeding  # Iron deficiency, transferrin saturation 12%  resume oral iron supplement on discharge   # Hypertension Resumed benazepril 5 mg p.o. daily home dose Monitor BP and titrate medications accordingly   Generalized weakness, fatigue and tired could be due to decreased oral intake Malnutrition mild to moderate Body mass index is 21.87 kg/m.  Interventions: Started Ensure protein supplement  Diet: FLD DVT  Prophylaxis: SCD, pharmacological prophylaxis contraindicated due to GI bleeding    Advance goals of care discussion: Full code  Family Communication: family was not present at bedside, at the time of interview.  The pt provided permission to discuss medical plan with the family. Opportunity was given to ask question and all questions were answered satisfactorily.  6/21 patient's daughter was at bedside, management plan discussed.  Disposition:  Pt is from Home, admitted with GI bleeding, still has GI bleeding, which precludes a safe discharge. Discharge to Home, when stable.  Subjective: No significant events overnight, patient denies any abdominal pain, no nausea vomiting.  Denies any chest pain or palpitation, no shortness of breath.,  Patient was laying comfortably.  Patient is very hard of hearing. Per RN patient is bleeding intermittently, we will continue to monitor.   Physical Exam: General: NAD, lying comfortably Appear in no distress, affect appropriate Eyes: PERRLA ENT: Oral Mucosa Clear, moist  Neck: no JVD,  Cardiovascular: S1 and S2 Present, no Murmur,  Respiratory: good respiratory effort, Bilateral Air entry equal and Decreased, no Crackles, no wheezes Abdomen: Bowel Sound present, Soft and no tenderness,  Skin: no rashes Extremities: no Pedal edema, no calf tenderness Neurologic: without any new focal findings Gait not checked due to patient safety concerns  Vitals:   05/02/23 1525 05/02/23 2130 05/03/23 0456 05/03/23 0731  BP: (!) 146/90 (!) 172/93 132/64 (!) 146/78  Pulse: 63 72 68 67  Resp: 18 18 17 18   Temp: 98.1 F (36.7 C) 98.3 F (36.8 C) 98.4 F (36.9 C) 98.1 F (36.7 C)  TempSrc:  Oral Oral Oral  SpO2:  98% 98% 97% 96%  Weight:      Height:        Intake/Output Summary (Last 24 hours) at 05/03/2023 1303 Last data filed at 05/02/2023 1822 Gross per 24 hour  Intake 1624.42 ml  Output --  Net 1624.42 ml   Filed Weights   05/01/23 1323   Weight: 50.8 kg    Data Reviewed: I have personally reviewed and interpreted daily labs, tele strips, imagings as discussed above. I reviewed all nursing notes, pharmacy notes, vitals, pertinent old records I have discussed plan of care as described above with RN and patient/family.  CBC: Recent Labs  Lab 05/01/23 1326 05/01/23 1547 05/02/23 0917 05/02/23 1914 05/03/23 0533 05/03/23 1220  WBC 6.0  --  4.7  --  4.1  --   HGB 10.9* 11.3* 7.8* 9.2* 9.1* 10.0*  HCT 35.4* 35.8* 24.9* 28.9* 28.2* 31.4*  MCV 87.2  --  86.8  --  86.0  --   PLT 228  --  183  --  169  --    Basic Metabolic Panel: Recent Labs  Lab 05/01/23 1326 05/02/23 0642 05/02/23 0917 05/03/23 0533  NA 136 139  --  137  K 4.0 4.0  --  3.5  CL 103 110  --  109  CO2 23 24  --  22  GLUCOSE 119* 107*  --  81  BUN 23 24*  --  21  CREATININE 0.90 0.77  --  0.71  CALCIUM 8.4* 8.2*  --  8.1*  MG  --   --  2.1 2.1  PHOS  --   --  3.3 2.9    Studies: No results found.  Scheduled Meds:  benazepril  5 mg Oral Daily   pantoprazole  40 mg Oral Daily   Continuous Infusions:  sodium chloride Stopped (05/02/23 1134)   PRN Meds: acetaminophen, ondansetron (ZOFRAN) IV  Time spent: 40 minutes  Author: Gillis Santa. MD [facility] 05/03/2023 1:03 PM  To reach On-call, see care teams to locate the attending and reach out to them via [email or web address]. If 7PM-7AM, please contact night-coverage If you still have difficulty reaching the attending provider, please page the [facility] (Director on Call) for [facility] on amion for assistance.

## 2023-05-03 NOTE — Care Management Important Message (Signed)
Important Message  Patient Details  Name: Rhonda Ingram MRN: [id number] Date of Birth: 1926-10-21   Medicare Important Message Given:  N/A - LOS <3 / Initial given by admissions     Olegario Messier A Kelijah Towry 05/03/2023, 8:42 AM

## 2023-05-03 NOTE — Progress Notes (Signed)
Rhonda Ingram is a 89 y.o. female with medical history significant of hyperlipidemia, hypertension presenting with lower GI bleeding.  Dr Norma Fredrickson notes she may still be having GI bleeding so she was scheduled to come to the Vascular Lab for possible embolization.   Patient was brought to the lab but had eaten both breakfast and lunch today. Serial H&H show that She has increased from 9.2 to 10.0 at noon time this afternoon. The last time she received any blood was the morning of 05/02/2023.   Discussion between Dr Norma Fredrickson and Dr Gilda Crease determined the best plan for this patient at this time would be to continue to check Q6 hour H&H and observe and record all bowel movements to evaluate for bleeding. Therefore this afternoons procedure was cancelled and patient was sent back to her room. Patients vitals all remain stable.

## 2023-05-03 NOTE — Progress Notes (Signed)
[facility] GI inpatient brief Progress Note  Patient per RN still having intermittent bleeding with marked drop in Hgb requiring transfusion. Vascular surgery following for possible angiography, but currently conservative management has been advised by them.  Vitals:   05/03/23 0456 05/03/23 0731  BP: 132/64 (!) 146/78  Pulse: 68 67  Resp: 17 18  Temp: 98.4 F (36.9 C) 98.1 F (36.7 C)  SpO2: 97% 96%       Labs:    Latest Ref Rng & Units 05/03/2023   12:20 PM 05/03/2023    5:33 AM 05/02/2023    7:14 PM  CBC  WBC 4.0 - 10.5 K/uL  4.1    Hemoglobin 12.0 - 15.0 g/dL 65.7  9.1  9.2   Hematocrit 36.0 - 46.0 % 31.4  28.2  28.9   Platelets 150 - 400 K/uL  169      CMP     Component Value Date/Time   NA 137 05/03/2023 0533   NA 140 04/10/2023 1521   NA 143 07/16/2012 0434   K 3.5 05/03/2023 0533   K 3.9 07/16/2012 0434   CL 109 05/03/2023 0533   CL 109 (H) 07/16/2012 0434   CO2 22 05/03/2023 0533   CO2 32 07/16/2012 0434   GLUCOSE 81 05/03/2023 0533   GLUCOSE 92 07/16/2012 0434   BUN 21 05/03/2023 0533   BUN 20 04/10/2023 1521   BUN 7 07/16/2012 0434   CREATININE 0.71 05/03/2023 0533   CREATININE 0.66 07/16/2012 0434   CALCIUM 8.1 (L) 05/03/2023 0533   CALCIUM 8.0 (L) 07/16/2012 0434   PROT 5.6 (L) 05/02/2023 0642   PROT 7.0 04/10/2023 1521   PROT 6.2 (L) 07/15/2012 1609   ALBUMIN 2.5 (L) 05/02/2023 0642   ALBUMIN 3.7 04/10/2023 1521   ALBUMIN 2.4 (L) 07/15/2012 1609   AST 15 05/02/2023 0642   AST 28 08/02/2015 1353   AST 15 07/15/2012 1609   ALT 7 05/02/2023 0642   ALT 11 08/02/2015 1353   ALT 10 (L) 07/15/2012 1609   ALKPHOS 36 (L) 05/02/2023 0642   ALKPHOS 61 07/15/2012 1609   BILITOT 0.3 05/02/2023 0642   BILITOT 0.2 04/10/2023 1521   BILITOT 0.2 07/15/2012 1609   GFRNONAA >60 05/03/2023 0533   GFRNONAA >60 07/16/2012 0434   GFRAA 83 11/08/2020 1425   GFRAA >60 07/16/2012 0434      Impression:  Lower GI bleeding ,presumably from sigmoid  diverticulosis. 2.   Anemia - Hgb stable at 11.3 3.   Advanced age 89.   Chronic back pain.     Plan:   Follow H/H 2.   Further interventions, if needed, per Dr. Gilda Crease /vascular surgery [facility]. 3.  Will follow peripherally, call back if needed.    Thank you  T. Bing Plume, M.D. ABIM Diplomate in Gastroenterology [facility] [phone] - Cell

## 2023-05-03 NOTE — Progress Notes (Signed)
Patient to [facility] for pre-procedure readiness. Patient awake, alert, talkative, and in no acute distress.  Patient states she did have breakfast and lunch today.  Upon assessment, PIV to right AC does not flush with NS and has slight swelling at site.  This PIV discontinued and new PIV started to left forearm and IV fluids running to healthy new PIV site.  Prior to getting patient ready for procedure, discussion between Dr. Gilda Crease and Dr. Norma Fredrickson as well as Rolla Plate, NP, and after discussion, decided to discontinue pre-procedure readiness and cancel procedure and plan to monitor patient labs, VS, and output.  See provider notes.  Patient returned to her room and hand-off done with bedside nurse, Noreene Filbert, RN.  Great grandson, Janyth Pupa, was in room and had patient's daughter on phone and was able to briefly update her as well.

## 2023-05-04 DIAGNOSIS — Z515 Encounter for palliative care: Secondary | ICD-10-CM | POA: Diagnosis not present

## 2023-05-04 DIAGNOSIS — Z7189 Other specified counseling: Secondary | ICD-10-CM | POA: Diagnosis not present

## 2023-05-04 DIAGNOSIS — K922 Gastrointestinal hemorrhage, unspecified: Secondary | ICD-10-CM | POA: Diagnosis not present

## 2023-05-04 DIAGNOSIS — I10 Essential (primary) hypertension: Secondary | ICD-10-CM | POA: Diagnosis not present

## 2023-05-04 LAB — BASIC METABOLIC PANEL
Anion gap: 4 — ABNORMAL LOW (ref 5–15)
BUN: 17 mg/dL (ref 8–23)
CO2: 22 mmol/L (ref 22–32)
Calcium: 7.8 mg/dL — ABNORMAL LOW (ref 8.9–10.3)
Chloride: 114 mmol/L — ABNORMAL HIGH (ref 98–111)
Creatinine, Ser: 0.7 mg/dL (ref 0.44–1.00)
GFR, Estimated: >60 mL/min (ref >60)
Glucose, Bld: 85 mg/dL (ref 70–99)
Potassium: 3.2 mmol/L — ABNORMAL LOW (ref 3.5–5.1)
Sodium: 140 mmol/L (ref 135–145)

## 2023-05-04 LAB — CBC
HCT: 27.1 % — ABNORMAL LOW (ref 36.0–46.0)
Hemoglobin: 8.5 g/dL — ABNORMAL LOW (ref 12.0–15.0)
MCH: 27.2 pg (ref 26.0–34.0)
MCHC: 31.4 g/dL (ref 30.0–36.0)
MCV: 86.9 fL (ref 80.0–100.0)
Platelets: 180 10*3/uL (ref 150–400)
RBC: 3.12 MIL/uL — ABNORMAL LOW (ref 3.87–5.11)
RDW: 16.7 % — ABNORMAL HIGH (ref 11.5–15.5)
WBC: 5.2 10*3/uL (ref 4.0–10.5)
nRBC: 0 % (ref 0.0–0.2)

## 2023-05-04 LAB — HEMOGLOBIN AND HEMATOCRIT, BLOOD
HCT: 27.5 % — ABNORMAL LOW (ref 36.0–46.0)
Hemoglobin: 8.7 g/dL — ABNORMAL LOW (ref 12.0–15.0)

## 2023-05-04 LAB — MAGNESIUM: Magnesium: 2.1 mg/dL (ref 1.7–2.4)

## 2023-05-04 LAB — PHOSPHORUS: Phosphorus: 2.7 mg/dL (ref 2.5–4.6)

## 2023-05-04 MED ORDER — POTASSIUM CHLORIDE CRYS ER 20 MEQ PO TBCR
40.0000 meq | EXTENDED_RELEASE_TABLET | ORAL | Status: AC
  Administered 2023-05-04 (×2): 40 meq via ORAL
  Filled 2023-05-04 (×2): qty 2

## 2023-05-04 NOTE — Progress Notes (Signed)
[facility] Progress Note  Patient: Rhonda Ingram    ZSW:[id number]  DOA: 05/01/2023     Date of Service: the patient was seen and examined on 05/04/2023  Chief Complaint  Patient presents with   Rectal Bleeding   Brief hospital course: Rhonda Ingram is a 89 y.o. female with medical history significant of hyperlipidemia, hypertension presenting with lower GI bleeding.  Patient reports recurrent rectal bleeding over the past 1 to 2 days.  No fevers or chills.  No nausea or vomiting.  Minimal to mild abdominal pain.  Noted similar issue requiring admission November 2021. Patient denies any recent blood thinner use, though she does report some ? Recent NSAID use w/ goody powder. No CP or SOB. No hemiparesis or confusion.  Presented to ER afebrile, hemodynamically stable. WBC 6, hgb 11, plt 230s. Cr 0.9.    Assessment and Plan:  # Lower GI bleeding and acute blood loss anemia Hb 7.8, transfuse 1 unit of PRBC, Hb 9.1 posttransfusion Monitor H&H and transfuse as needed GI consulted, recommended CTA CTA: 1. Active GI bleed in the mid sigmoid colon. 2. Severe diffuse colonic diverticulosis. 3. Aortic atherosclerosis without aneurysm or dissection.  Vascular surgery was consulted, recommended conservative management no intervention  Palliative care consulted for goals of care discussion. Pt was made DNR/DNI Patient is still having intermittent GI bleeding, Hb 10.0, remained stable Resumed full liquid diet, we will gradually advance as per improvement in her GI bleeding Hb 8.7, stable, patient is still having intermittent bleeding which is small amount, no bleeding since morning today  # Hypokalemia, mild Potassium repleted.  Monitor electrolytes.  # Iron deficiency, transferrin saturation 12%  resume oral iron supplement on discharge   # Hypertension Resumed benazepril 5 mg p.o. daily home dose Monitor BP and titrate medications accordingly   Generalized weakness, fatigue  and tired could be due to decreased oral intake Malnutrition mild to moderate Body mass index is 21.87 kg/m.  Interventions: Started Ensure protein supplement  Diet: FLD DVT Prophylaxis: SCD, pharmacological prophylaxis contraindicated due to GI bleeding    Advance goals of care discussion: Full code  Family Communication: family was not present at bedside, at the time of interview.  The pt provided permission to discuss medical plan with the family. Opportunity was given to ask question and all questions were answered satisfactorily.  6/21 patient's daughter was at bedside, management plan discussed.  Disposition:  Pt is from Home, admitted with GI bleeding, still has GI bleeding, which precludes a safe discharge. Discharge to Home, when stable.  Subjective: No significant events overnight, patient denies any bleeding today since morning.  As per RN patient is having small amount of blood clots but when she is having BM, no blood at that time. Patient denies any abdominal pain, no nausea vomiting, no chest pain or palpitation, no shortness of breath. Patient was laying comfortably.   Physical Exam: General: NAD, lying comfortably Appear in no distress, affect appropriate Eyes: PERRLA ENT: Oral Mucosa Clear, moist  Neck: no JVD,  Cardiovascular: S1 and S2 Present, no Murmur,  Respiratory: good respiratory effort, Bilateral Air entry equal and Decreased, no Crackles, no wheezes Abdomen: Bowel Sound present, Soft and no tenderness,  Skin: no rashes Extremities: no Pedal edema, no calf tenderness Neurologic: without any new focal findings Gait not checked due to patient safety concerns  Vitals:   05/03/23 1719 05/03/23 2056 05/04/23 0504 05/04/23 0828  BP:  (!) 124/52 (!) 151/65 137/82  Pulse: 70  70 63 79  Resp:  16 16 18   Temp: 98.3 F (36.8 C) 98.2 F (36.8 C) 98.4 F (36.9 C) 98 F (36.7 C)  TempSrc:  Oral    SpO2:  98% 96% 97%  Weight:      Height:       No  intake or output data in the 24 hours ending 05/04/23 1409  Filed Weights   05/01/23 1323  Weight: 50.8 kg    Data Reviewed: I have personally reviewed and interpreted daily labs, tele strips, imagings as discussed above. I reviewed all nursing notes, pharmacy notes, vitals, pertinent old records I have discussed plan of care as described above with RN and patient/family.  CBC: Recent Labs  Lab 05/01/23 1326 05/01/23 1547 05/02/23 0917 05/02/23 1914 05/03/23 0533 05/03/23 1220 05/04/23 0446 05/04/23 1235  WBC 6.0  --  4.7  --  4.1  --  5.2  --   HGB 10.9*   < > 7.8* 9.2* 9.1* 10.0* 8.5* 8.7*  HCT 35.4*   < > 24.9* 28.9* 28.2* 31.4* 27.1* 27.5*  MCV 87.2  --  86.8  --  86.0  --  86.9  --   PLT 228  --  183  --  169  --  180  --    < > = values in this interval not displayed.   Basic Metabolic Panel: Recent Labs  Lab 05/01/23 1326 05/02/23 0642 05/02/23 0917 05/03/23 0533 05/04/23 0446  NA 136 139  --  137 140  K 4.0 4.0  --  3.5 3.2*  CL 103 110  --  109 114*  CO2 23 24  --  22 22  GLUCOSE 119* 107*  --  81 85  BUN 23 24*  --  21 17  CREATININE 0.90 0.77  --  0.71 0.70  CALCIUM 8.4* 8.2*  --  8.1* 7.8*  MG  --   --  2.1 2.1 2.1  PHOS  --   --  3.3 2.9 2.7    Studies: No results found.  Scheduled Meds:  benazepril  5 mg Oral Daily   pantoprazole  40 mg Oral Daily   Continuous Infusions:  sodium chloride 75 mL/hr at 05/03/23 1610   PRN Meds: acetaminophen, ondansetron (ZOFRAN) IV  Time spent: 40 minutes  Author: Gillis Santa. MD [facility] 05/04/2023 2:09 PM  To reach On-call, see care teams to locate the attending and reach out to them via [email or web address]. If 7PM-7AM, please contact night-coverage If you still have difficulty reaching the attending provider, please page the [facility] (Director on Call) for [facility] on amion for assistance.

## 2023-05-04 NOTE — Progress Notes (Signed)
Daily Progress Note   Patient Name: Rhonda Ingram       Date: 05/04/2023 DOB: 1925-12-28  Age: 89 y.o. MRN#: [id number] Attending Physician: Gillis Santa, MD Primary Care Physician: Marjie Skiff, NP Admit Date: 05/01/2023  Reason for Consultation/Follow-up: Establishing goals of care  HPI/Brief Hospital Review: 89 year old female with past medical history of HLD, HTN, arthritis and osteoporosis admitted from home with lower GI bleed. Reported recurrent rectal bleeding x 1-2 days prior to admission.  CTA 6/19 IMPRESSION: 1. Active GI bleed in the mid sigmoid colon. 2. Severe diffuse colonic diverticulosis. 3. Aortic atherosclerosis without aneurysm or dissection.  GI and vascular consulted-trend hemoglobins Did require blood transfusion Planned for embolization 6/21 but procedure cancelled for various reasons, continuing to monitor at this time  Of noted, Rhonda Ingram  shares she was recently started on iron supplements by PCP due to newly found iron deficiency   Palliative Medicine consulted for assisting with goals of care conversations.  Subjective: Extensive chart review has been completed prior to meeting patient including labs, vital signs, imaging, progress notes, orders, and available advanced directive documents from current and previous encounters.    Visited with Rhonda Ingram at her bedside. Awake, alert, sitting up in chair finishing breakfast. Able to answer orientation questions, HOH which complicates conversations. She shares that she did not experience blood loss overnight, denies abdominal pain or discomfort, tolerating breakfast without issue.  We discussed planned procedure yesterday, able to share her understanding and shares the plan is to continue to  monitor her for continued bleeding and monitoring lab values closely. We discussed possible need for vascular intervention in the future with ongoing bleeding for which she is agreeable to.  Answered and addressed all questions and concerns. PMT to continue to follow as needs arise and support.   Palliative Care Assessment & Plan   Assessment/Recommendation/Plan  DNR/DNI Time for outcomes, monitoring for bleeding, trending H&H PMT to continue to follow as needs arise  Thank you for allowing the Palliative Medicine Team to assist in the care of this patient.  Total time:  25 minutes  Time spent includes: Detailed review of medical records (labs, imaging, vital signs), medically appropriate exam (mental status, respiratory, cardiac, skin), discussed with treatment team, counseling and educating patient, family and staff, documenting clinical information, medication  management and coordination of care.  Leeanne Deed, DNP, AGNP-C Palliative Medicine   Please contact Palliative Medicine Team phone at [phone] for questions and concerns.

## 2023-05-05 DIAGNOSIS — K922 Gastrointestinal hemorrhage, unspecified: Secondary | ICD-10-CM | POA: Diagnosis not present

## 2023-05-05 LAB — BASIC METABOLIC PANEL
Anion gap: 3 — ABNORMAL LOW (ref 5–15)
BUN: 10 mg/dL (ref 8–23)
CO2: 21 mmol/L — ABNORMAL LOW (ref 22–32)
Calcium: 7.8 mg/dL — ABNORMAL LOW (ref 8.9–10.3)
Chloride: 109 mmol/L (ref 98–111)
Creatinine, Ser: 0.68 mg/dL (ref 0.44–1.00)
GFR, Estimated: >60 mL/min (ref >60)
Glucose, Bld: 117 mg/dL — ABNORMAL HIGH (ref 70–99)
Potassium: 3.8 mmol/L (ref 3.5–5.1)
Sodium: 139 mmol/L (ref 135–145)

## 2023-05-05 LAB — CBC
HCT: 29.6 % — ABNORMAL LOW (ref 36.0–46.0)
Hemoglobin: 8.9 g/dL — ABNORMAL LOW (ref 12.0–15.0)
MCH: 27.2 pg (ref 26.0–34.0)
MCHC: 30.1 g/dL (ref 30.0–36.0)
MCV: 90.5 fL (ref 80.0–100.0)
Platelets: 181 10*3/uL (ref 150–400)
RBC: 3.27 MIL/uL — ABNORMAL LOW (ref 3.87–5.11)
RDW: 16.6 % — ABNORMAL HIGH (ref 11.5–15.5)
WBC: 5.3 10*3/uL (ref 4.0–10.5)
nRBC: 0 % (ref 0.0–0.2)

## 2023-05-05 LAB — PHOSPHORUS: Phosphorus: 2.4 mg/dL — ABNORMAL LOW (ref 2.5–4.6)

## 2023-05-05 LAB — MAGNESIUM: Magnesium: 1.8 mg/dL (ref 1.7–2.4)

## 2023-05-05 MED ORDER — K PHOS MONO-SOD PHOS DI & MONO 155-852-130 MG PO TABS
500.0000 mg | ORAL_TABLET | Freq: Three times a day (TID) | ORAL | Status: DC
  Administered 2023-05-05: 500 mg via ORAL
  Filled 2023-05-05: qty 2

## 2023-05-05 NOTE — Progress Notes (Signed)
Mobility Specialist - Progress Note     05/05/23 1321  Mobility  Activity Ambulated with assistance in hallway;Stood at bedside;Dangled on edge of bed  Level of Assistance Standby assist, set-up cues, supervision of patient - no hands on  Assistive Device Front wheel walker  Distance Ambulated (ft) 90 ft  Range of Motion/Exercises Active  Activity Response Tolerated well  Mobility Referral Yes  $Mobility charge 1 Mobility  Mobility Specialist Start Time (ACUTE ONLY) 1254  Mobility Specialist Stop Time (ACUTE ONLY) 1319  Mobility Specialist Time Calculation (min) (ACUTE ONLY) 25 min   Pt resting in bed on RA upon entry. Pt STS and ambulates to hallway around NS SBA holding onto IV pole for additional support. Pt returned to recliner and left with needs in reach and chair alarm activated.   Johnathan Hausen Mobility Specialist 05/05/23, 2:00 PM

## 2023-05-05 NOTE — Progress Notes (Signed)
Rhonda Ingram is being discharged home. Discharge papers given and explained to Rhonda Ingram's daughter Kalman Drape and Rhonda Ingram. Daughter verbalized understanding. Medications and follow up appointments reviewed. No Rx at this time.

## 2023-05-05 NOTE — Progress Notes (Signed)
Visited with Rhonda Ingram at her bedside, no acute palliative needs. Anticipated discharge today.  No Charge.  Leeanne Deed, DNP, AGNP-C Palliative Medicine  Please call Palliative Medicine team phone with any questions [phone]. For individual providers please see AMION.

## 2023-05-05 NOTE — Discharge Summary (Signed)
[facility] Discharge Summary   Patient: Rhonda Ingram WUJ:[id number]  PCP: Marjie Skiff, NP  Date of admission: 05/01/2023   Date of discharge:  05/05/2023     Discharge Diagnoses:  Principal Problem:   GIB (gastrointestinal bleeding) Active Problems:   Hypertension   Admitted From: Home Disposition:  Home  Recommendations for Outpatient Follow-up:  PCP: in 1 wk Follow up LABS/TEST:  CBC in 1 wk   Diet recommendation: low fiber soft diet  Activity: The patient is advised to gradually reintroduce usual activities, as tolerated  Discharge Condition: stable  Code Status: DNR   History of present illness: As per the H and P dictated on admission Hospital Course:  Rhonda Ingram is a 89 y.o. female with medical history significant of hyperlipidemia, hypertension presenting with lower GI bleeding.  Patient reports recurrent rectal bleeding over the past 1 to 2 days.  No fevers or chills.  No nausea or vomiting.  Minimal to mild abdominal pain.  Noted similar issue requiring admission November 2021. Patient denies any recent blood thinner use, though she does report some ? Recent NSAID use w/ goody powder. No CP or SOB. No hemiparesis or confusion.  Presented to ER afebrile, hemodynamically stable. WBC 6, hgb 11, plt 230s. Cr 0.9.    Assessment and Plan: # Lower GI bleeding and acute blood loss anemia Hb 7.8, transfuse 1 unit of PRBC, Hb 9.1 posttransfusion GI consulted, recommended CTA CTA: 1. Active GI bleed in the mid sigmoid colon. 2. Severe diffuse colonic diverticulosis. 3. Aortic atherosclerosis without aneurysm or dissection.  Vascular surgery was consulted, recommended conservative management no intervention  Palliative care consulted for goals of care discussion. Pt was made DNR/DNI Gradually lower GI bleeding resolved by itself.  Patient is having intermittent some clots but no brisk lower GI bleeding.  Hb 8.9 today stable.  Patient denies any GI bleeding,  no abdominal pain, no any other complaints.  Patient would like to go home today.  Discussed with patient's daughter who agreed with the discharge planning.   Recommended to follow with PCP and repeat CBC in 1 week. # Hypokalemia, mild, Potassium repleted.  Resolved # Iron deficiency, transferrin saturation 12%, resumed oral iron supplement on discharge # Hypertension: Resumed benazepril 5 mg p.o. daily home dose. Generalized weakness, fatigue and tired could be due to decreased oral intake Malnutrition mild to moderate Body mass index is 21.87 kg/m.  Interventions: Started Ensure protein supplement  Patient was ambulatory without any assistance. On the day of the discharge the patient's vitals were stable, and no other acute medical condition were reported by patient. the patient was felt safe to be discharge at Home.  Consultants: GI and vascular surgery  Procedures: None  Discharge Exam: General: Appear in no distress, no Rash; Oral Mucosa Clear, moist. Cardiovascular: S1 and S2 Present, no Murmur, Respiratory: normal respiratory effort, Bilateral Air entry present and no Crackles, no wheezes Abdomen: Bowel Sound present, Soft and no tenderness, no hernia Extremities: no Pedal edema, no calf tenderness Neurology: alert and oriented to time, place, and person affect appropriate.  Filed Weights   05/01/23 1323  Weight: 50.8 kg   Vitals:   05/05/23 0458 05/05/23 0907  BP: (!) 168/73 (!) 154/91  Pulse: 65 86  Resp: 16 18  Temp: 98.9 F (37.2 C) 98.2 F (36.8 C)  SpO2: 96% 97%    DISCHARGE MEDICATION: Allergies as of 05/05/2023       Reactions   Alpha-d-galactosidase Itching, Shortness  Of Breath   Meat Extract Shortness Of Breath   Other    Alpha-gal allergy (meat allergy or Mammalian Meat Allergy) beef/red meat/pork        Medication List     STOP taking these medications    triamcinolone cream 0.1 % Commonly known as: KENALOG       TAKE these medications     benazepril 5 MG tablet Commonly known as: LOTENSIN Take 1 tablet (5 mg total) by mouth daily.   ferrous sulfate 324 MG Tbec Take 1 tablet (324 mg total) by mouth 2 (two) times daily. What changed: when to take this   magnesium oxide 400 MG tablet Commonly known as: MAG-OX Take 400 mg by mouth at bedtime.   VITAMIN D PO Take 2,000 Units by mouth daily.               Durable Medical Equipment  (From admission, onward)           Start     Ordered   05/02/23 1510  For home use only DME Walker rolling  Once       Question Answer Comment  Walker: With 5 Inch Wheels   Patient needs a walker to treat with the following condition Generalized weakness      05/02/23 1509           Allergies  Allergen Reactions   Alpha-D-Galactosidase Itching and Shortness Of Breath   Meat Extract Shortness Of Breath   Other     Alpha-gal allergy (meat allergy or Mammalian Meat Allergy) beef/red meat/pork   Discharge Instructions     Call MD for:   Complete by: As directed    Active GI bleeding   Call MD for:  difficulty breathing, headache or visual disturbances   Complete by: As directed    Call MD for:  extreme fatigue   Complete by: As directed    Call MD for:  persistant dizziness or light-headedness   Complete by: As directed    Call MD for:  persistant nausea and vomiting   Complete by: As directed    Call MD for:  severe uncontrolled pain   Complete by: As directed    Call MD for:  temperature >100.4   Complete by: As directed    Diet - low sodium heart healthy   Complete by: As directed    Discharge instructions   Complete by: As directed    Follow-up with ECP in 1 week, repeat CBC next week.   Increase activity slowly   Complete by: As directed        The results of significant diagnostics from this hospitalization (including imaging, microbiology, ancillary and laboratory) are listed below for reference.    Significant Diagnostic Studies: CT ANGIO  GI BLEED  Result Date: 05/01/2023 CLINICAL DATA:  GI bleed. EXAM: CTA ABDOMEN AND PELVIS WITHOUT AND WITH CONTRAST TECHNIQUE: Multidetector CT imaging of the abdomen and pelvis was performed using the standard protocol during bolus administration of intravenous contrast. Multiplanar reconstructed images and MIPs were obtained and reviewed to evaluate the vascular anatomy. RADIATION DOSE REDUCTION: This exam was performed according to the departmental dose-optimization program which includes automated exposure control, adjustment of the mA and/or kV according to patient size and/or use of iterative reconstruction technique. CONTRAST:  OMNIPAQUE IOHEXOL 350 MG/ML SOLN COMPARISON:  CT scan 2021 FINDINGS: VASCULAR Aorta: Tortuosity, ectasia and calcification but no focal aneurysm dissection. Celiac: Mild ostial calcifications but no significant stenosis, aneurysm or  dissection. SMA: Moderate ostial calcifications but no significant stenosis, aneurysm or dissection. Renals: Bilateral ostial calcifications but no significant stenosis or dissection. IMA: Patent Inflow: Atherosclerotic calcifications but no stenosis or dissection. Proximal Outflow: Mild changes of FMD involving the iliac arteries but no aneurysm dissection. Veins: No significant findings. Review of the MIP images confirms the above findings. NON-VASCULAR Lower chest: No infiltrates or effusions. Mild cardiac enlargement. Descending thoracic aortic calcifications and coronary artery calcifications. Hepatobiliary: No hepatic lesions. Mild intra and extrahepatic biliary dilatation likely due to prior cholecystectomy. Pancreas: No mass, inflammation or ductal dilatation. Spleen: Normal size.  No focal lesions. Adrenals/Urinary Tract: Adrenal glands and kidneys unremarkable. Bladder is largely decompressed but no abnormality. Stomach/Bowel: Evidence of active extravasation of contrast in the mid sigmoid colon which becomes more pronounced on the portal  venous phase imaging study. No other sites of active contrast extravasation are identified. There is severe diffuse colonic diverticulosis. Lymphatic: No abdominal or pelvic lymphadenopathy. Reproductive: No significant findings. Other: No free abdominal/free pelvic fluid collections or free air. Musculoskeletal: Age related degenerative changes but no bone lesions or fractures. IMPRESSION: 1. Active GI bleed in the mid sigmoid colon. 2. Severe diffuse colonic diverticulosis. 3. Aortic atherosclerosis without aneurysm or dissection. These results will be called to the ordering clinician or representative by the Radiologist Assistant, and communication documented in the PACS or [employer]. Aortic Atherosclerosis (ICD10-I70.0). Electronically Signed   By: Rudie Meyer M.D.   On: 05/01/2023 18:31    Microbiology: No results found for this or any previous visit (from the past 240 hour(s)).   Labs: CBC: Recent Labs  Lab 05/01/23 1326 05/01/23 1547 05/02/23 0917 05/02/23 1914 05/03/23 0533 05/03/23 1220 05/04/23 0446 05/04/23 1235 05/05/23 0430  WBC 6.0  --  4.7  --  4.1  --  5.2  --  5.3  HGB 10.9*   < > 7.8*   < > 9.1* 10.0* 8.5* 8.7* 8.9*  HCT 35.4*   < > 24.9*   < > 28.2* 31.4* 27.1* 27.5* 29.6*  MCV 87.2  --  86.8  --  86.0  --  86.9  --  90.5  PLT 228  --  183  --  169  --  180  --  181   < > = values in this interval not displayed.   Basic Metabolic Panel: Recent Labs  Lab 05/01/23 1326 05/02/23 0642 05/02/23 0917 05/03/23 0533 05/04/23 0446 05/05/23 0430  NA 136 139  --  137 140 139  K 4.0 4.0  --  3.5 3.2* 3.8  CL 103 110  --  109 114* 109  CO2 23 24  --  22 22 21*  GLUCOSE 119* 107*  --  81 85 117*  BUN 23 24*  --  21 17 10   CREATININE 0.90 0.77  --  0.71 0.70 0.68  CALCIUM 8.4* 8.2*  --  8.1* 7.8* 7.8*  MG  --   --  2.1 2.1 2.1 1.8  PHOS  --   --  3.3 2.9 2.7 2.4*   Liver Function Tests: Recent Labs  Lab 05/02/23 0642  AST 15  ALT 7  ALKPHOS 36*   BILITOT 0.3  PROT 5.6*  ALBUMIN 2.5*   No results for input(s): "LIPASE", "AMYLASE" in the last 168 hours. No results for input(s): "AMMONIA" in the last 168 hours. Cardiac Enzymes: No results for input(s): "CKTOTAL", "CKMB", "CKMBINDEX", "TROPONINI" in the last 168 hours. BNP (last 3 results) No results for input(s): "  BNP" in the last 8760 hours. CBG: No results for input(s): "GLUCAP" in the last 168 hours.  Time spent: 35 minutes  Signed:  Gillis Santa  [facility]  05/05/2023 2:11 PM

## 2023-05-06 ENCOUNTER — Emergency Department
Admission: EM | Admit: 2023-05-06 | Discharge: 2023-05-06 | Disposition: A | Discharge status: Home / Self Care | Payer: Medicare HMO | Attending: Emergency Medicine | Admitting: Emergency Medicine

## 2023-05-06 ENCOUNTER — Telehealth: Payer: Self-pay | Admitting: *Deleted

## 2023-05-06 ENCOUNTER — Emergency Department: Payer: Medicare HMO

## 2023-05-06 DIAGNOSIS — Z8719 Personal history of other diseases of the digestive system: Secondary | ICD-10-CM

## 2023-05-06 DIAGNOSIS — K5730 Diverticulosis of large intestine without perforation or abscess without bleeding: Secondary | ICD-10-CM | POA: Diagnosis not present

## 2023-05-06 DIAGNOSIS — K921 Melena: Secondary | ICD-10-CM

## 2023-05-06 DIAGNOSIS — I701 Atherosclerosis of renal artery: Secondary | ICD-10-CM | POA: Diagnosis not present

## 2023-05-06 DIAGNOSIS — I10 Essential (primary) hypertension: Secondary | ICD-10-CM | POA: Diagnosis not present

## 2023-05-06 DIAGNOSIS — R1084 Generalized abdominal pain: Secondary | ICD-10-CM | POA: Diagnosis not present

## 2023-05-06 DIAGNOSIS — R42 Dizziness and giddiness: Secondary | ICD-10-CM | POA: Insufficient documentation

## 2023-05-06 DIAGNOSIS — R9431 Abnormal electrocardiogram [ECG] [EKG]: Secondary | ICD-10-CM | POA: Diagnosis not present

## 2023-05-06 DIAGNOSIS — K838 Other specified diseases of biliary tract: Secondary | ICD-10-CM | POA: Diagnosis not present

## 2023-05-06 LAB — COMPREHENSIVE METABOLIC PANEL
ALT: 9 U/L (ref 0–44)
AST: 16 U/L (ref 15–41)
Albumin: 2.8 g/dL — ABNORMAL LOW (ref 3.5–5.0)
Alkaline Phosphatase: 51 U/L (ref 38–126)
Anion gap: 5 (ref 5–15)
BUN: 16 mg/dL (ref 8–23)
CO2: 26 mmol/L (ref 22–32)
Calcium: 8.5 mg/dL — ABNORMAL LOW (ref 8.9–10.3)
Chloride: 108 mmol/L (ref 98–111)
Creatinine, Ser: 0.8 mg/dL (ref 0.44–1.00)
GFR, Estimated: >60 mL/min (ref >60)
Glucose, Bld: 101 mg/dL — ABNORMAL HIGH (ref 70–99)
Potassium: 4.1 mmol/L (ref 3.5–5.1)
Sodium: 139 mmol/L (ref 135–145)
Total Bilirubin: 0.5 mg/dL (ref 0.3–1.2)
Total Protein: 6.5 g/dL (ref 6.5–8.1)

## 2023-05-06 LAB — CBC
HCT: 29.1 % — ABNORMAL LOW (ref 36.0–46.0)
Hemoglobin: 9 g/dL — ABNORMAL LOW (ref 12.0–15.0)
MCH: 27.2 pg (ref 26.0–34.0)
MCHC: 30.9 g/dL (ref 30.0–36.0)
MCV: 87.9 fL (ref 80.0–100.0)
Platelets: 230 10*3/uL (ref 150–400)
RBC: 3.31 MIL/uL — ABNORMAL LOW (ref 3.87–5.11)
RDW: 16.2 % — ABNORMAL HIGH (ref 11.5–15.5)
WBC: 4.6 10*3/uL (ref 4.0–10.5)
nRBC: 0 % (ref 0.0–0.2)

## 2023-05-06 LAB — TYPE AND SCREEN
ABO/RH(D): A POS
Antibody Screen: NEGATIVE

## 2023-05-06 MED ORDER — IOHEXOL 350 MG/ML SOLN
80.0000 mL | Freq: Once | INTRAVENOUS | Status: AC | PRN
  Administered 2023-05-06: 75 mL via INTRAVENOUS

## 2023-05-06 NOTE — ED Provider Notes (Signed)
[facility] Provider Note   Event Date/Time   First MD Initiated Contact with Patient 05/06/23 1500     (approximate) History  Abdominal Pain  HPI Rhonda Ingram is a 89 y.o. female with a recent past medical history of GI bleeding and discharged yesterday who presents complaining of continued melena.  Patient states that she initially presented with explosive grossly bloody stools however it has now become mostly dark tarry stools.  Patient denies any continued abdominal pain however she does endorse orthostatic lightheadedness ROS: Patient currently denies any vision changes, tinnitus, difficulty speaking, facial droop, sore throat, chest pain, shortness of breath, nausea/vomiting/diarrhea, dysuria, or weakness/numbness/paresthesias in any extremity   Physical Exam  Triage Vital Signs: ED Triage Vitals [05/06/23 1319]  Enc Vitals Group     BP (!) 150/79     Pulse Rate 82     Resp 16     Temp 98.7 F (37.1 C)     Temp Source Oral     SpO2 96 %     Weight 105 lb (47.6 kg)     Height 5' (1.524 m)     Head Circumference      Peak Flow      Pain Score 0     Pain Loc      Pain Edu?      Excl. in GC?    Most recent vital signs: Vitals:   05/06/23 1854 05/06/23 1900  BP: (!) 189/93 (!) 196/83  Pulse: 69 72  Resp: 18   Temp: 98.4 F (36.9 C)   SpO2: 100% 100%   General: Awake, oriented x4. CV:  Good peripheral perfusion.  Resp:  Normal effort.  Abd:  No distention.  Other:  Elderly well-developed, well-nourished Caucasian female laying in bed in no acute distress ED Results / Procedures / Treatments  Labs (all labs ordered are listed, but only abnormal results are displayed) Labs Reviewed  COMPREHENSIVE METABOLIC PANEL - Abnormal; Notable for the following components:      Result Value   Glucose, Bld 101 (*)    Calcium 8.5 (*)    Albumin 2.8 (*)    All other components within normal limits  CBC - Abnormal; Notable for the following  components:   RBC 3.31 (*)    Hemoglobin 9.0 (*)    HCT 29.1 (*)    RDW 16.2 (*)    All other components within normal limits  POC OCCULT BLOOD, ED  TYPE AND SCREEN  RADIOLOGY ED MD interpretation: CT angiography of the abdomen and pelvis interpreted independently by me and shows no evidence of acute bleeding or other abnormalities. -Agree with radiology assessment Official radiology report(s): CT ANGIO GI BLEED  Result Date: 05/06/2023 CLINICAL DATA:  History of GI bleed with dark stools EXAM: CTA ABDOMEN AND PELVIS WITHOUT AND WITH CONTRAST TECHNIQUE: Multidetector CT imaging of the abdomen and pelvis was performed using the standard protocol during bolus administration of intravenous contrast. Multiplanar reconstructed images and MIPs were obtained and reviewed to evaluate the vascular anatomy. RADIATION DOSE REDUCTION: This exam was performed according to the departmental dose-optimization program which includes automated exposure control, adjustment of the mA and/or kV according to patient size and/or use of iterative reconstruction technique. CONTRAST:  75mL OMNIPAQUE IOHEXOL 350 MG/ML SOLN COMPARISON:  CTA abdomen and pelvis dated 05/01/2023 FINDINGS: VASCULAR Aorta: Aortic atherosclerosis. Normal caliber aorta without aneurysm, dissection, vasculitis or significant stenosis. Celiac: Patent without evidence of aneurysm, dissection, vasculitis or significant stenosis.  SMA: Moderate stenosis of the SMA origin due to atherosclerotic plaque. No evidence of aneurysm, dissection, or vasculitis. Renals: Single renal arteries bilaterally. Moderate to severe stenoses of the renal artery origins due to atherosclerotic plaque. No evidence of aneurysm, dissection, vasculitis, or fibromuscular dysplasia. IMA: Patent without evidence of aneurysm, dissection, vasculitis or significant stenosis. Inflow: Patent without evidence of aneurysm, dissection, vasculitis or significant stenosis. Proximal Outflow:  Bilateral common femoral and visualized portions of the superficial and profunda femoral arteries are patent without evidence of aneurysm, dissection, vasculitis or significant stenosis. Veins: No obvious venous abnormality. Review of the MIP images confirms the above findings. NON-VASCULAR Lower chest: No focal consolidation or pulmonary nodule in the lung bases. No pleural effusion or pneumothorax demonstrated. Partially imaged heart size is normal. Coronary artery calcifications. Hepatobiliary: Unchanged scattered subcentimeter hypodensities, too small to characterize. Mild intra and extrahepatic bile duct dilation. Cholecystectomy. Pancreas: No focal lesions or main ductal dilation. Spleen: Normal in size without focal abnormality. Adrenals/Urinary Tract: No adrenal nodules. No suspicious renal mass, calculi or hydronephrosis. No focal bladder wall thickening. Stomach/Bowel: Normal appearance of the stomach. No evidence of bowel wall thickening, distention, or inflammatory changes. Extensive colonic diverticulosis. Normal appendix. Lymphatic: No enlarged abdominal or pelvic lymph nodes. Reproductive: No adnexal masses. Other: No free fluid, fluid collection, or free air. Musculoskeletal: No acute or abnormal lytic or blastic osseous lesions. Multilevel degenerative changes of the partially imaged thoracic and lumbar spine. Unchanged grade 1 anterolisthesis at L4-5 and compression deformity of T12. IMPRESSION: 1. No evidence of active GI bleed. 2. No acute findings in the abdomen or pelvis. 3. Extensive colonic diverticulosis without evidence of acute diverticulitis. 4.  Aortic Atherosclerosis (ICD10-I70.0). Electronically Signed   By: Agustin Cree M.D.   On: 05/06/2023 19:08   PROCEDURES: Critical Care performed: No .1-3 Lead EKG Interpretation  Performed by: Merwyn Katos, MD Authorized by: Merwyn Katos, MD     Interpretation: normal     ECG rate:  71   ECG rate assessment: normal     Rhythm: sinus  rhythm     Ectopy: none     Conduction: normal    MEDICATIONS ORDERED IN ED: Medications  iohexol (OMNIPAQUE) 350 MG/ML injection 80 mL (75 mLs Intravenous Contrast Given 05/06/23 1839)   IMPRESSION / MDM / ASSESSMENT AND PLAN / ED COURSE  I reviewed the triage vital signs and the nursing notes.                             The patient is on the cardiac monitor to evaluate for evidence of arrhythmia and/or significant heart rate changes. Patient's presentation is most consistent with acute presentation with potential threat to life or bodily function. Given history and exam patients presentation most consistent with Lower GI bleed possibly secondary to hemorrhoid or other nonemergent cause of bleeding. I have low suspicion for Aortoenteric fistula, Upper GI Bleed, IBD, Mesenteric Ischemia, Rectal foreign body or ulcer.  Workup: CBC, BMP, PT/INR, Type and Screen CT angiography of the abdomen/pelvis does not show any evidence of continued active bleeding. Spoke with patient at length about the likelihood of continued melena due to her previous GI bleed.  Patient's questions were answered to the best my ability prior to discharge patient agrees with plan for discharge home and follow-up with PCP as needed. Disposition: Discharge. Hemodynamically stable with no gross blood on rectal exam. SRP and prompt PCP follow up.  FINAL CLINICAL IMPRESSION(S) / ED DIAGNOSES   Final diagnoses:  Melena  History of lower GI bleeding   Rx / DC Orders   ED Discharge Orders     None      Note:  This document was prepared using Dragon voice recognition software and may include unintentional dictation errors.   Merwyn Katos, MD 05/06/23 [phone]

## 2023-05-06 NOTE — Transitions of Care (Post Inpatient/ED Visit) (Signed)
   05/06/2023  Name: Rhonda Ingram MRN: [id number] DOB: 03-Nov-1926  Today's TOC FU Call Status: Today's TOC FU Call Status:: Unsuccessul Call (1st Attempt) Unsuccessful Call (1st Attempt) Date: 05/06/23  Attempted to reach the patient regarding the most recent Inpatient/ED visit.  Follow Up Plan: Additional outreach attempts will be made to reach the patient to complete the Transitions of Care (Post Inpatient/ED visit) call.   Gean Maidens BSN RN [facility] [phone]

## 2023-05-06 NOTE — ED Triage Notes (Signed)
Pt arrived by EMS from home d/t dark stools. Pt was just recently discharged from [facility] d/t blood in stools and low hemaglobin.

## 2023-05-06 NOTE — ED Notes (Signed)
Patient transported to CT 

## 2023-05-07 ENCOUNTER — Telehealth: Payer: Self-pay | Admitting: *Deleted

## 2023-05-07 NOTE — Transitions of Care (Post Inpatient/ED Visit) (Signed)
   05/07/2023  Name: Rhonda Ingram MRN: [id number] DOB: 1926-06-15  Today's TOC FU Call Status: Today's TOC FU Call Status:: Unsuccessful Call (2nd Attempt) Unsuccessful Call (2nd Attempt) Date: 05/07/23  Attempted to reach the patient regarding the most recent Inpatient/ED visit.  Follow Up Plan: Additional outreach attempts will be made to reach the patient to complete the Transitions of Care (Post Inpatient/ED visit) call.   Gean Maidens BSN RN [facility] [phone]

## 2023-05-09 ENCOUNTER — Telehealth: Payer: Self-pay | Admitting: *Deleted

## 2023-05-09 NOTE — Transitions of Care (Post Inpatient/ED Visit) (Signed)
   05/09/2023  Name: TRENACE COUGHLIN MRN: [id number] DOB: [birth year]  Today's TOC FU Call Status: Today's TOC FU Call Status:: Unsuccessful Call (3rd Attempt) Unsuccessful Call (3rd Attempt) Date: 05/09/23  Attempted to reach the patient regarding the most recent Inpatient/ED visit.  Follow Up Plan: No further outreach attempts will be made at this time. We have been unable to contact the patient.  Gean Maidens BSN RN [facility] [phone]

## 2023-05-14 ENCOUNTER — Encounter: Payer: Self-pay | Admitting: Physician Assistant

## 2023-05-14 ENCOUNTER — Ambulatory Visit (INDEPENDENT_AMBULATORY_CARE_PROVIDER_SITE_OTHER): Payer: Medicare HMO | Admitting: Physician Assistant

## 2023-05-14 VITALS — BP 123/71 | HR 69 | Ht 60.0 in | Wt 99.0 lb

## 2023-05-14 DIAGNOSIS — Z09 Encounter for follow-up examination after completed treatment for conditions other than malignant neoplasm: Secondary | ICD-10-CM | POA: Diagnosis not present

## 2023-05-14 DIAGNOSIS — K922 Gastrointestinal hemorrhage, unspecified: Secondary | ICD-10-CM | POA: Diagnosis not present

## 2023-05-14 NOTE — Progress Notes (Signed)
Acute Office Visit   Patient: Rhonda Ingram   DOB: 03-03-1926   89 y.o. Female  MRN: [id number] Visit Date: 05/14/2023  Today's healthcare provider: Oswaldo Conroy Donique Hammonds, PA-C  Introduced myself to the patient as a Secondary school teacher and provided education on APPs in clinical practice.    Chief Complaint  Patient presents with   GI Bleeding    Patient is here for hospital follow up on GI Bleed on 05/06/23. Patient declines having any bloody stools since hospitalization.    Hospitalization Follow-up   Subjective    HPI HPI     GI Bleeding    Additional comments: Patient is here for hospital follow up on GI Bleed on 05/06/23. Patient declines having any bloody stools since hospitalization.       Last edited by Malen Gauze, CMA on 05/14/2023  1:14 PM.       She is here with her great grandson   GI bleed   She reports she is feeling a bit sleepy today but is not having pain or discomfort  She denies bright red blood recently  She reports she has been having black stools - her grandson states this was just when she was dc and has not recurred since  She reports she gets tired if she is up on her feet a lot   Transition of Care Hospital Follow up.   Hospital/Facility: [facility]  D/C Physician: Gillis Santa, MD  D/C Date: 05/05/23  Records Requested:  Records Received:  Records Reviewed: Discharge summary   Diagnoses on Discharge: Lower GI bleed   Date of interactive Contact within 48 hours of discharge: three attempt were made (first 2 within 48 hours of dc)  Contact was through: direct  Date of 7 day or 14 day face-to-face visit:    within 14 days  Outpatient Encounter Medications as of 05/14/2023  Medication Sig   benazepril (LOTENSIN) 5 MG tablet Take 1 tablet (5 mg total) by mouth daily.   ferrous sulfate 324 MG TBEC Take 1 tablet (324 mg total) by mouth 2 (two) times daily.   magnesium oxide (MAG-OX) 400 MG tablet Take 400 mg by mouth at bedtime.   VITAMIN D PO Take 2,000  Units by mouth daily.   No facility-administered encounter medications on file as of 05/14/2023.    Diagnostic Tests Reviewed/Disposition:   Consults: GI and vascular surgery   Discharge Instructions: Return precautions reviewed with patient, Follow up with PCP, gradually increase activity, follow heart healthy diet   Disease/illness Education:  Home Health/Community Services Discussions/Referrals: none   Establishment or re-establishment of referral orders for community resources: NA   Discussion with other health care providers:none   Assessment and Support of treatment regimen adherence: patient and family are monitoring for recurrent symptoms   Appointments Coordinated with: none  Education for self-management, independent living, and ADLs: patient is staying with family- her granddaughter is primary care giver     Medications: Outpatient Medications Prior to Visit  Medication Sig   benazepril (LOTENSIN) 5 MG tablet Take 1 tablet (5 mg total) by mouth daily.   ferrous sulfate 324 MG TBEC Take 1 tablet (324 mg total) by mouth 2 (two) times daily.   magnesium oxide (MAG-OX) 400 MG tablet Take 400 mg by mouth at bedtime.   VITAMIN D PO Take 2,000 Units by mouth daily.   No facility-administered medications prior to visit.    Review of Systems  Constitutional:  Positive  for fatigue. Negative for activity change, appetite change, chills and fever.  Respiratory:  Negative for shortness of breath.   Cardiovascular:  Negative for chest pain, palpitations and leg swelling.  Gastrointestinal:  Negative for abdominal pain, anal bleeding, blood in stool, constipation and nausea.       Objective    BP 123/71   Pulse 69   Ht 5' (1.524 m)   Wt 99 lb (44.9 kg)   LMP  (LMP Unknown)   BMI 19.33 kg/m    Physical Exam Vitals reviewed.  Constitutional:      General: She is awake.     Appearance: Normal appearance. She is well-developed and well-groomed.  HENT:     Head:  Normocephalic and atraumatic.     Right Ear: Decreased hearing noted.     Left Ear: Decreased hearing noted.  Cardiovascular:     Rate and Rhythm: Normal rate and regular rhythm.     Heart sounds: Normal heart sounds.  Pulmonary:     Effort: Pulmonary effort is normal.     Breath sounds: Normal breath sounds. No decreased air movement. No decreased breath sounds, wheezing, rhonchi or rales.  Musculoskeletal:     Cervical back: Normal range of motion.  Neurological:     Mental Status: She is alert.  Psychiatric:        Behavior: Behavior is cooperative.       Results for orders placed or performed in visit on 05/14/23  CBC w/Diff  Result Value Ref Range   WBC 4.1 3.4 - 10.8 x10E3/uL   RBC 3.27 (L) 3.77 - 5.28 x10E6/uL   Hemoglobin 9.0 (L) 11.1 - 15.9 g/dL   Hematocrit 16.1 (L) 09.6 - 46.6 %   MCV 88 79 - 97 fL   MCH 27.5 26.6 - 33.0 pg   MCHC 31.4 (L) 31.5 - 35.7 g/dL   RDW 04.5 40.9 - 81.1 %   Platelets 317 150 - 450 x10E3/uL   Neutrophils 54 Not Estab. %   Lymphs 28 Not Estab. %   Monocytes 13 Not Estab. %   Eos 2 Not Estab. %   Basos 2 Not Estab. %   Neutrophils Absolute 2.2 1.4 - 7.0 x10E3/uL   Lymphocytes Absolute 1.2 0.7 - 3.1 x10E3/uL   Monocytes Absolute 0.5 0.1 - 0.9 x10E3/uL   EOS (ABSOLUTE) 0.1 0.0 - 0.4 x10E3/uL   Basophils Absolute 0.1 0.0 - 0.2 x10E3/uL   Immature Granulocytes 1 Not Estab. %   Immature Grans (Abs) 0.0 0.0 - 0.1 x10E3/uL  Comp Met (CMET)  Result Value Ref Range   Glucose 124 (H) 70 - 99 mg/dL   BUN 13 10 - 36 mg/dL   Creatinine, Ser 9.14 0.57 - 1.00 mg/dL   eGFR 54 (L) >78 GN/FAO/1.30   BUN/Creatinine Ratio 14 12 - 28   Sodium 138 134 - 144 mmol/L   Potassium 4.3 3.5 - 5.2 mmol/L   Chloride 102 96 - 106 mmol/L   CO2 24 20 - 29 mmol/L   Calcium 9.0 8.7 - 10.3 mg/dL   Total Protein 6.6 6.0 - 8.5 g/dL   Albumin 3.3 (L) 3.6 - 4.6 g/dL   Globulin, Total 3.3 1.5 - 4.5 g/dL   Bilirubin Total 0.2 0.0 - 1.2 mg/dL   Alkaline Phosphatase 56  44 - 121 IU/L   AST 14 0 - 40 IU/L   ALT 8 0 - 32 IU/L  Iron, TIBC and Ferritin Panel  Result Value Ref Range   Total  Iron Binding Capacity 304 250 - 450 ug/dL   UIBC 528 413 - 244 ug/dL   Iron 010 27 - 272 ug/dL   Iron Saturation 37 15 - 55 %   Ferritin 29 15 - 150 ng/mL    Assessment & Plan      No follow-ups on file.      Problem List Items Addressed This Visit       Digestive   GIB (gastrointestinal bleeding)   Relevant Orders   CBC w/Diff (Completed)   Comp Met (CMET) (Completed)   Iron, TIBC and Ferritin Panel (Completed)   Other Visit Diagnoses     Hospital discharge follow-up    -  Primary Follow up for recent hospitalization and subsequent ED visit  She reports she was having rectal bleeding and GI bleed was discovered during work up in the ED I have reviewed her hospital DC paper work along with labs, imaging results She denies continued dark stools, rectal bleeding, SOB Will get CBC and CMP along with iron panel today for monitoring- results to dictate further management Reviewed ED and return precautions.  Follow up as needed for persistent or progressing symptoms     Relevant Orders   CBC w/Diff (Completed)   Comp Met (CMET) (Completed)        No follow-ups on file.   I, Shaquna Geigle E Mirage Pfefferkorn, PA-C, have reviewed all documentation for this visit. The documentation on 05/17/23 for the exam, diagnosis, procedures, and orders are all accurate and complete.   Jacquelin Hawking, MHS, PA-C [facility]

## 2023-05-15 LAB — CBC WITH DIFFERENTIAL/PLATELET
Basophils Absolute: 0.1 10*3/uL (ref 0.0–0.2)
Basos: 2 %
EOS (ABSOLUTE): 0.1 10*3/uL (ref 0.0–0.4)
Eos: 2 %
Hematocrit: 28.7 % — ABNORMAL LOW (ref 34.0–46.6)
Hemoglobin: 9 g/dL — ABNORMAL LOW (ref 11.1–15.9)
Immature Grans (Abs): 0 10*3/uL (ref 0.0–0.1)
Immature Granulocytes: 1 %
Lymphocytes Absolute: 1.2 10*3/uL (ref 0.7–3.1)
Lymphs: 28 %
MCH: 27.5 pg (ref 26.6–33.0)
MCHC: 31.4 g/dL — ABNORMAL LOW (ref 31.5–35.7)
MCV: 88 fL (ref 79–97)
Monocytes Absolute: 0.5 10*3/uL (ref 0.1–0.9)
Monocytes: 13 %
Neutrophils Absolute: 2.2 10*3/uL (ref 1.4–7.0)
Neutrophils: 54 %
Platelets: 317 10*3/uL (ref 150–450)
RBC: 3.27 x10E6/uL — ABNORMAL LOW (ref 3.77–5.28)
RDW: 14.7 % (ref 11.7–15.4)
WBC: 4.1 10*3/uL (ref 3.4–10.8)

## 2023-05-15 LAB — IRON,TIBC AND FERRITIN PANEL
Ferritin: 29 ng/mL (ref 15–150)
Iron Saturation: 37 % (ref 15–55)
Iron: 112 ug/dL (ref 27–139)
Total Iron Binding Capacity: 304 ug/dL (ref 250–450)
UIBC: 192 ug/dL (ref 118–369)

## 2023-05-15 LAB — COMPREHENSIVE METABOLIC PANEL
ALT: 8 IU/L (ref 0–32)
AST: 14 IU/L (ref 0–40)
Albumin: 3.3 g/dL — ABNORMAL LOW (ref 3.6–4.6)
Alkaline Phosphatase: 56 IU/L (ref 44–121)
BUN/Creatinine Ratio: 14 (ref 12–28)
BUN: 13 mg/dL (ref 10–36)
Bilirubin Total: 0.2 mg/dL (ref 0.0–1.2)
CO2: 24 mmol/L (ref 20–29)
Calcium: 9 mg/dL (ref 8.7–10.3)
Chloride: 102 mmol/L (ref 96–106)
Creatinine, Ser: 0.96 mg/dL (ref 0.57–1.00)
Globulin, Total: 3.3 g/dL (ref 1.5–4.5)
Glucose: 124 mg/dL — ABNORMAL HIGH (ref 70–99)
Potassium: 4.3 mmol/L (ref 3.5–5.2)
Sodium: 138 mmol/L (ref 134–144)
Total Protein: 6.6 g/dL (ref 6.0–8.5)
eGFR: 54 mL/min/{1.73_m2} — ABNORMAL LOW (ref >59)

## 2023-05-17 NOTE — Progress Notes (Signed)
Your labs are back  You are still a bit anemic but your labs are stable compared to the last ones from the hospital Your iron levels look good Your electrolytes, liver and kidney function all look stable as well  Please let us know if you start having concerns again for bleeding or if you start to feel weak, have shortness of breath.

## 2023-10-12 NOTE — Patient Instructions (Signed)

## 2023-10-15 ENCOUNTER — Encounter: Payer: Self-pay | Admitting: Nurse Practitioner

## 2023-10-15 ENCOUNTER — Ambulatory Visit
Admission: RE | Admit: 2023-10-15 | Discharge: 2023-10-15 | Disposition: A | Discharge status: Home / Self Care | Payer: Medicare HMO | Source: Ambulatory Visit | Attending: Nurse Practitioner | Admitting: Nurse Practitioner

## 2023-10-15 ENCOUNTER — Ambulatory Visit (INDEPENDENT_AMBULATORY_CARE_PROVIDER_SITE_OTHER): Payer: Medicare HMO | Admitting: Nurse Practitioner

## 2023-10-15 ENCOUNTER — Ambulatory Visit: Payer: Medicare HMO

## 2023-10-15 VITALS — BP 136/74 | HR 61 | Temp 97.7°F | Ht 60.0 in | Wt 93.8 lb

## 2023-10-15 DIAGNOSIS — Z23 Encounter for immunization: Secondary | ICD-10-CM | POA: Diagnosis not present

## 2023-10-15 DIAGNOSIS — D500 Iron deficiency anemia secondary to blood loss (chronic): Secondary | ICD-10-CM

## 2023-10-15 DIAGNOSIS — R413 Other amnesia: Secondary | ICD-10-CM | POA: Insufficient documentation

## 2023-10-15 DIAGNOSIS — R011 Cardiac murmur, unspecified: Secondary | ICD-10-CM | POA: Diagnosis not present

## 2023-10-15 DIAGNOSIS — I10 Essential (primary) hypertension: Secondary | ICD-10-CM

## 2023-10-15 DIAGNOSIS — E782 Mixed hyperlipidemia: Secondary | ICD-10-CM | POA: Diagnosis not present

## 2023-10-15 DIAGNOSIS — I700 Atherosclerosis of aorta: Secondary | ICD-10-CM

## 2023-10-15 DIAGNOSIS — Z9181 History of falling: Secondary | ICD-10-CM

## 2023-10-15 DIAGNOSIS — E559 Vitamin D deficiency, unspecified: Secondary | ICD-10-CM | POA: Diagnosis not present

## 2023-10-15 DIAGNOSIS — D692 Other nonthrombocytopenic purpura: Secondary | ICD-10-CM

## 2023-10-15 DIAGNOSIS — F0390 Unspecified dementia without behavioral disturbance: Secondary | ICD-10-CM | POA: Insufficient documentation

## 2023-10-15 DIAGNOSIS — R8281 Pyuria: Secondary | ICD-10-CM

## 2023-10-15 LAB — URINALYSIS, ROUTINE W REFLEX MICROSCOPIC
Bilirubin, UA: NEGATIVE
Glucose, UA: NEGATIVE
Ketones, UA: NEGATIVE
Nitrite, UA: NEGATIVE
Specific Gravity, UA: >1.03 — ABNORMAL HIGH (ref 1.005–1.030)
Urobilinogen, Ur: 0.2 mg/dL (ref 0.2–1.0)
pH, UA: 6 (ref 5.0–7.5)

## 2023-10-15 LAB — MICROSCOPIC EXAMINATION: Bacteria, UA: NONE SEEN

## 2023-10-15 NOTE — Assessment & Plan Note (Signed)
Ongoing, with improvement.  Continue supplement daily and check level today. ?

## 2023-10-15 NOTE — Progress Notes (Signed)
BP 136/74 (BP Location: Left Arm, Patient Position: Sitting, Cuff Size: Normal)   Pulse 61   Temp 97.7 F (36.5 C) (Oral)   Ht 5' (1.524 m)   Wt 93 lb 12.8 oz (42.5 kg)   LMP  (LMP Unknown)   SpO2 97%   BMI 18.32 kg/m    Subjective:    Patient ID: Rhonda Ingram, female    DOB: 1926-01-17, 89 y.o.   MRN: [id number]  HPI: Rhonda Ingram is a 89 y.o. female  Chief Complaint  Patient presents with   Hyperlipidemia   Hypertension   Anemia   HYPERTENSION / HYPERLIPIDEMIA Continues on Benazepril daily, she is unsure if is taking -- memory changes presenting over past couple weeks.  Lives with her grand daughter, Lorene Dy, who reports memory changes over this time period -- has been gradual over past 2 months but over past 2 weeks is more prominent. Having some difficulty with word finding on occasion per grand daughter.  She is eating well, except not snacking as much. Satisfied with current treatment? yes Duration of hypertension: chronic BP monitoring frequency: not checking BP range:  BP medication side effects: no Duration of hyperlipidemia: chronic Aspirin: no Recent stressors: no Recurrent headaches: no Visual changes: no Palpitations: no Dyspnea: no Chest pain: no Lower extremity edema: no Dizzy/lightheaded: no    10/15/2023    2:11 PM 04/10/2023    3:43 PM 01/01/2022    9:23 AM 08/03/2019    1:49 PM 07/30/2018    1:26 PM  6CIT Screen  What Year? 4 points 0 points 0 points 0 points 0 points  What month? 3 points 0 points 0 points 0 points 0 points  What time?  0 points 0 points 0 points 0 points  Count back from 20 4 points 0 points 0 points 0 points 0 points  Months in reverse 4 points 0 points 4 points 0 points 0 points  Repeat phrase 10 points 2 points 0 points 2 points 2 points  Total Score  2 points 4 points 2 points 2 points   ANEMIA Has underlying leg cramps with this.  Continues Magnesium and Iron, but unsure if she is taking.  Vitamin D for history  of low levels. Anemia status: stable Etiology of anemia: Duration of anemia treatment:  Compliance with treatment: good compliance Iron supplementation side effects: no Severity of anemia: mild Fatigue: no Decreased exercise tolerance: no  Dyspnea on exertion: no Palpitations: no Bleeding: no Pica: no   Relevant past medical, surgical, family and social history reviewed and updated as indicated. Interim medical history since our last visit reviewed. Allergies and medications reviewed and updated.  Review of Systems  Constitutional:  Negative for activity change, appetite change, diaphoresis, fatigue and unexpected weight change.  Respiratory:  Negative for cough, chest tightness, shortness of breath and wheezing.   Cardiovascular:  Negative for chest pain, palpitations and leg swelling.  Gastrointestinal: Negative.   Endocrine: Negative.   Genitourinary: Negative.   Neurological:  Negative for dizziness, tremors, syncope, facial asymmetry, speech difficulty, weakness, numbness and headaches.  Psychiatric/Behavioral: Negative.      Per HPI unless specifically indicated above     Objective:    BP 136/74 (BP Location: Left Arm, Patient Position: Sitting, Cuff Size: Normal)   Pulse 61   Temp 97.7 F (36.5 C) (Oral)   Ht 5' (1.524 m)   Wt 93 lb 12.8 oz (42.5 kg)   LMP  (LMP Unknown)   SpO2  97%   BMI 18.32 kg/m   Wt Readings from Last 3 Encounters:  10/15/23 93 lb 12.8 oz (42.5 kg)  05/14/23 99 lb (44.9 kg)  05/06/23 105 lb (47.6 kg)    Physical Exam Vitals and nursing note reviewed.  Constitutional:      General: She is awake. She is not in acute distress.    Appearance: She is well-developed, well-groomed and underweight. She is not ill-appearing or toxic-appearing.     Comments: Some weight loss noted on exam today.  HENT:     Head: Normocephalic.     Right Ear: Hearing and external ear normal.     Left Ear: Hearing and external ear normal.  Eyes:     General:  Lids are normal.        Right eye: No discharge.        Left eye: No discharge.     Extraocular Movements: Extraocular movements intact.     Conjunctiva/sclera: Conjunctivae normal.     Pupils: Pupils are equal, round, and reactive to light.  Neck:     Thyroid: No thyromegaly.     Vascular: No carotid bruit.  Cardiovascular:     Rate and Rhythm: Normal rate and regular rhythm.     Heart sounds: Normal heart sounds. No murmur heard.    No gallop.  Pulmonary:     Effort: Pulmonary effort is normal. No accessory muscle usage or respiratory distress.     Breath sounds: Normal breath sounds. No decreased breath sounds, wheezing or rhonchi.  Abdominal:     General: Bowel sounds are normal. There is no distension.     Palpations: Abdomen is soft.     Tenderness: There is no abdominal tenderness.  Musculoskeletal:     Cervical back: Normal range of motion and neck supple.     Right lower leg: No edema.     Left lower leg: No edema.  Lymphadenopathy:     Cervical: No cervical adenopathy.  Skin:    General: Skin is warm and dry.  Neurological:     Mental Status: She is alert and oriented to person, place, and time.     Cranial Nerves: Cranial nerves 2-12 are intact.     Sensory: Sensation is intact.     Motor: Weakness (upper extremities bilateral) present.     Coordination: Finger-Nose-Finger Test and Heel to [place] Test normal.     Gait: Gait is intact.     Deep Tendon Reflexes: Reflexes are normal and symmetric.     Reflex Scores:      Brachioradialis reflexes are 2+ on the right side and 2+ on the left side.      Patellar reflexes are 2+ on the right side and 2+ on the left side.    Comments: Memory changes noted since previous visit. Having difficulty word finding, such as with feet calling them by other word.  When asked to squeeze can she reports does not like to "snake".  Not oriented to year, month, day.  Could not name what watch or pen are.  Psychiatric:        Attention and  Perception: Attention normal.        Mood and Affect: Mood normal.        Speech: Speech normal.        Behavior: Behavior normal. Behavior is cooperative.        Thought Content: Thought content normal.    Results for orders placed or performed in visit on  05/14/23  CBC w/Diff  Result Value Ref Range   WBC 4.1 3.4 - 10.8 x10E3/uL   RBC 3.27 (L) 3.77 - 5.28 x10E6/uL   Hemoglobin 9.0 (L) 11.1 - 15.9 g/dL   Hematocrit 38.1 (L) 82.9 - 46.6 %   MCV 88 79 - 97 fL   MCH 27.5 26.6 - 33.0 pg   MCHC 31.4 (L) 31.5 - 35.7 g/dL   RDW 93.7 16.9 - 67.8 %   Platelets 317 150 - 450 x10E3/uL   Neutrophils 54 Not Estab. %   Lymphs 28 Not Estab. %   Monocytes 13 Not Estab. %   Eos 2 Not Estab. %   Basos 2 Not Estab. %   Neutrophils Absolute 2.2 1.4 - 7.0 x10E3/uL   Lymphocytes Absolute 1.2 0.7 - 3.1 x10E3/uL   Monocytes Absolute 0.5 0.1 - 0.9 x10E3/uL   EOS (ABSOLUTE) 0.1 0.0 - 0.4 x10E3/uL   Basophils Absolute 0.1 0.0 - 0.2 x10E3/uL   Immature Granulocytes 1 Not Estab. %   Immature Grans (Abs) 0.0 0.0 - 0.1 x10E3/uL  Comp Met (CMET)  Result Value Ref Range   Glucose 124 (H) 70 - 99 mg/dL   BUN 13 10 - 36 mg/dL   Creatinine, Ser 9.38 0.57 - 1.00 mg/dL   eGFR 54 (L) >10 FB/PZW/2.58   BUN/Creatinine Ratio 14 12 - 28   Sodium 138 134 - 144 mmol/L   Potassium 4.3 3.5 - 5.2 mmol/L   Chloride 102 96 - 106 mmol/L   CO2 24 20 - 29 mmol/L   Calcium 9.0 8.7 - 10.3 mg/dL   Total Protein 6.6 6.0 - 8.5 g/dL   Albumin 3.3 (L) 3.6 - 4.6 g/dL   Globulin, Total 3.3 1.5 - 4.5 g/dL   Bilirubin Total 0.2 0.0 - 1.2 mg/dL   Alkaline Phosphatase 56 44 - 121 IU/L   AST 14 0 - 40 IU/L   ALT 8 0 - 32 IU/L  Iron, TIBC and Ferritin Panel  Result Value Ref Range   Total Iron Binding Capacity 304 250 - 450 ug/dL   UIBC 527 782 - 423 ug/dL   Iron 536 27 - 144 ug/dL   Iron Saturation 37 15 - 55 %   Ferritin 29 15 - 150 ng/mL      Assessment & Plan:   Problem List Items Addressed This Visit        Cardiovascular and Mediastinum   Aortic atherosclerosis (HCC)    Chronic.  Noted on imaging 09/21/20, at this time will monitor and avoid restart of ASA + statin.  History of GI bleed and advanced age.      Hypertension    Chronic, stable.  BP at goal for age today on recheck, however unsure she has been taking medications consistently.  Her grand daughter who lives with her is going to takeover this.  Continue current medication regimen and adjust as needed.  LABS: CBC, CMP today.  Urine ALB 80 May 2024, maintain ACE for kidney protection.  Recommend she monitor BP at home and document + focus on DASH diet.         Senile purpura (HCC) - Primary    Ongoing, stable with no skin breakdown.  Recommend gentle skin care and monitor for wounds, if present then notify provider.        Other   Heart murmur, systolic    Systolic, suspect some aortic stenosis based on exam.  At this time no symptoms present, she wishes to hold  off on echo.  Will consider this in future to assess further.      Hyperlipidemia    Noted on past labs, due to advanced age will avoid starting statin.  She prefers not to take more medication.      Relevant Orders   Comprehensive metabolic panel   Iron deficiency anemia due to chronic blood loss    Improved.  Check CBC, iron, ferritin today.  Recommend she continue daily iron supplement and take this with a little orange juice to enhance absorption.  Also recommend increase iron rich foods in diet.        Relevant Orders   Ferritin   Iron   CBC with Differential/Platelet   Comprehensive metabolic panel   Memory changes    Per grand daughter this started 2 months ago but has been worse over past two weeks.  She has significant difficulty word finding today and 6CIT 10, was 2 in May.  There is noted weight loss as well, they report less snacking but eating meals well.  Concern for stroke over past weeks or months, will obtain STAT imaging to further assess.   Discussed with family and her, they agree with this plan.  Grand daughter is going to take over medications at home.  No other deficits noted.      Relevant Orders   Urinalysis, Routine w reflex microscopic   CT HEAD WO CONTRAST ( )   Vitamin D deficiency    Ongoing, with improvement.  Continue supplement daily and check level today.      Relevant Orders   VITAMIN D 25 Hydroxy (Vit-D Deficiency, Fractures)   Other Visit Diagnoses     Flu vaccine need       Flu vaccine today, educated on this.   Relevant Orders   Flu Vaccine Trivalent High Dose (Fluad) (Completed)        Follow up plan: Return in about 1 week (around 10/22/2023) for Memory Changes.

## 2023-10-15 NOTE — Addendum Note (Signed)
Addended by: Aura Dials T on: 10/15/2023 04:52 PM   Modules accepted: Orders

## 2023-10-15 NOTE — Assessment & Plan Note (Signed)
Chronic.  Noted on imaging 09/21/20, at this time will monitor and avoid restart of ASA + statin.  History of GI bleed and advanced age. 

## 2023-10-15 NOTE — Assessment & Plan Note (Signed)
Chronic, stable.  BP at goal for age today on recheck, however unsure she has been taking medications consistently.  Her grand daughter who lives with her is going to takeover this.  Continue current medication regimen and adjust as needed.  LABS: CBC, CMP today.  Urine ALB 80 May 2024, maintain ACE for kidney protection.  Recommend she monitor BP at home and document + focus on DASH diet.

## 2023-10-15 NOTE — Assessment & Plan Note (Signed)
Improved.  Check CBC, iron, ferritin today.  Recommend she continue daily iron supplement and take this with a little orange juice to enhance absorption.  Also recommend increase iron rich foods in diet.

## 2023-10-15 NOTE — Assessment & Plan Note (Signed)
Noted on past labs, due to advanced age will avoid starting statin.  She prefers not to take more medication.

## 2023-10-15 NOTE — Assessment & Plan Note (Signed)
Per grand daughter this started 2 months ago but has been worse over past two weeks.  She has significant difficulty word finding today and 6CIT 10, was 2 in May.  There is noted weight loss as well, they report less snacking but eating meals well.  Concern for stroke over past weeks or months, will obtain STAT imaging to further assess.  Discussed with family and her, they agree with this plan.  Grand daughter is going to take over medications at home.  No other deficits noted.

## 2023-10-15 NOTE — Assessment & Plan Note (Signed)
Systolic, suspect some aortic stenosis based on exam.  At this time no symptoms present, she wishes to hold off on echo.  Will consider this in future to assess further.

## 2023-10-15 NOTE — Assessment & Plan Note (Signed)
Ongoing, stable with no skin breakdown.  Recommend gentle skin care and monitor for wounds, if present then notify provider. 

## 2023-10-15 NOTE — Progress Notes (Signed)
Spoke to granddaughter, DPR, on phone and reported findings to her.  If ongoing changes next week due to her difficulty word finding, which is new, and may obtain MRI to further assess.

## 2023-10-16 LAB — CBC WITH DIFFERENTIAL/PLATELET
Basophils Absolute: 0.1 10*3/uL (ref 0.0–0.2)
Basos: 1 %
EOS (ABSOLUTE): 0 10*3/uL (ref 0.0–0.4)
Eos: 1 %
Hematocrit: 37.4 % (ref 34.0–46.6)
Hemoglobin: 11.9 g/dL (ref 11.1–15.9)
Immature Grans (Abs): 0 10*3/uL (ref 0.0–0.1)
Immature Granulocytes: 0 %
Lymphocytes Absolute: 1.2 10*3/uL (ref 0.7–3.1)
Lymphs: 23 %
MCH: 28.1 pg (ref 26.6–33.0)
MCHC: 31.8 g/dL (ref 31.5–35.7)
MCV: 88 fL (ref 79–97)
Monocytes Absolute: 0.6 10*3/uL (ref 0.1–0.9)
Monocytes: 11 %
Neutrophils Absolute: 3.4 10*3/uL (ref 1.4–7.0)
Neutrophils: 64 %
Platelets: 234 10*3/uL (ref 150–450)
RBC: 4.24 x10E6/uL (ref 3.77–5.28)
RDW: 13.3 % (ref 11.7–15.4)
WBC: 5.2 10*3/uL (ref 3.4–10.8)

## 2023-10-16 LAB — IRON: Iron: 67 ug/dL (ref 27–139)

## 2023-10-16 LAB — VITAMIN D 25 HYDROXY (VIT D DEFICIENCY, FRACTURES): Vit D, 25-Hydroxy: 32.5 ng/mL (ref 30.0–100.0)

## 2023-10-16 LAB — FERRITIN: Ferritin: 32 ng/mL (ref 15–150)

## 2023-10-16 LAB — COMPREHENSIVE METABOLIC PANEL
ALT: 5 [IU]/L (ref 0–32)
AST: 15 [IU]/L (ref 0–40)
Albumin: 3.4 g/dL — ABNORMAL LOW (ref 3.6–4.6)
Alkaline Phosphatase: 60 [IU]/L (ref 44–121)
BUN/Creatinine Ratio: 24 (ref 12–28)
BUN: 18 mg/dL (ref 10–36)
Bilirubin Total: 0.3 mg/dL (ref 0.0–1.2)
CO2: 24 mmol/L (ref 20–29)
Calcium: 9.4 mg/dL (ref 8.7–10.3)
Chloride: 103 mmol/L (ref 96–106)
Creatinine, Ser: 0.74 mg/dL (ref 0.57–1.00)
Globulin, Total: 3.6 g/dL (ref 1.5–4.5)
Glucose: 100 mg/dL — ABNORMAL HIGH (ref 70–99)
Potassium: 4 mmol/L (ref 3.5–5.2)
Sodium: 141 mmol/L (ref 134–144)
Total Protein: 7 g/dL (ref 6.0–8.5)
eGFR: 74 mL/min/{1.73_m2} (ref >59)

## 2023-10-16 NOTE — Progress Notes (Signed)
Good day, please let Rhonda Ingram's family know labs have returned and overall are stable.  No findings to explain current memory changes.  We will follow-up in the upcoming week, if any worsening or you notice any slurred speech, loss of function in an extremity, or facial drooping take her immediately to ER.:) Keep being stellar!!  Thank you for allowing me to participate in your care.  I appreciate you. Kindest regards, Kamir Selover

## 2023-10-17 LAB — URINE CULTURE: Organism ID, Bacteria: NO GROWTH

## 2023-10-26 NOTE — Patient Instructions (Signed)

## 2023-10-28 ENCOUNTER — Ambulatory Visit (INDEPENDENT_AMBULATORY_CARE_PROVIDER_SITE_OTHER): Payer: Medicare HMO | Admitting: Nurse Practitioner

## 2023-10-28 ENCOUNTER — Encounter: Payer: Self-pay | Admitting: Nurse Practitioner

## 2023-10-28 VITALS — BP 113/65 | HR 63 | Temp 98.7°F | Resp 14 | Wt 91.6 lb

## 2023-10-28 DIAGNOSIS — I10 Essential (primary) hypertension: Secondary | ICD-10-CM

## 2023-10-28 DIAGNOSIS — R413 Other amnesia: Secondary | ICD-10-CM | POA: Diagnosis not present

## 2023-10-28 DIAGNOSIS — R634 Abnormal weight loss: Secondary | ICD-10-CM | POA: Insufficient documentation

## 2023-10-28 MED ORDER — FERROUS SULFATE 324 MG PO TBEC: 324.0000 mg | DELAYED_RELEASE_TABLET | Freq: Two times a day (BID) | ORAL | 4 refills | Status: DC

## 2023-10-28 NOTE — Progress Notes (Signed)
BP 113/65 (BP Location: Right Arm, Patient Position: Sitting, Cuff Size: Normal)   Pulse 63   Temp 98.7 F (37.1 C) (Oral)   Resp 14   Wt 91 lb 9.6 oz (41.5 kg)   LMP  (LMP Unknown)   SpO2 97%   BMI 17.89 kg/m    Subjective:    Patient ID: Merry Proud, female    DOB: 1926/03/10, 89 y.o.   MRN: [id number]  HPI: JAZZILYN BLOOMINGDALE is a 89 y.o. female  Chief Complaint  Patient presents with   Follow-up    Memory changes    Grand daughter at bedside.  HYPERTENSION & MEMORY Continues on Benazepril daily, last visit she was unsure if she was taking consistently -- her family reports since they got her back on medication regularly things have improved.  Memory has improved with consistent medication.  Lives with her grand daughter, Lorene Dy, and reports is more coherent.  She is eating well, except not snacking as much. Satisfied with current treatment? yes Duration of hypertension: chronic BP monitoring frequency: not checking BP range:  BP medication side effects: no Aspirin: no Recent stressors: no Recurrent headaches: no Visual changes: no Palpitations: no Dyspnea: no Chest pain: no Lower extremity edema: no Dizzy/lightheaded: no    10/28/2023    4:38 PM 10/15/2023    2:11 PM 04/10/2023    3:43 PM 01/01/2022    9:23 AM 08/03/2019    1:49 PM  6CIT Screen  What Year? 0 points 4 points 0 points 0 points 0 points  What month? 0 points 3 points 0 points 0 points 0 points  What time? 3 points  0 points 0 points 0 points  Count back from 20 4 points 4 points 0 points 0 points 0 points  Months in reverse 4 points 4 points 0 points 4 points 0 points  Repeat phrase 2 points 10 points 2 points 0 points 2 points  Total Score 13 points  2 points 4 points 2 points   Relevant past medical, surgical, family and social history reviewed and updated as indicated. Interim medical history since our last visit reviewed. Allergies and medications reviewed and updated.  Review of  Systems  Constitutional:  Positive for unexpected weight change. Negative for activity change, appetite change, diaphoresis and fatigue.  Respiratory:  Negative for cough, chest tightness, shortness of breath and wheezing.   Cardiovascular:  Negative for chest pain, palpitations and leg swelling.  Gastrointestinal: Negative.   Endocrine: Negative.   Genitourinary: Negative.   Neurological:  Negative for dizziness, tremors, syncope, facial asymmetry, speech difficulty, weakness, numbness and headaches.  Psychiatric/Behavioral: Negative.      Per HPI unless specifically indicated above     Objective:    BP 113/65 (BP Location: Right Arm, Patient Position: Sitting, Cuff Size: Normal)   Pulse 63   Temp 98.7 F (37.1 C) (Oral)   Resp 14   Wt 91 lb 9.6 oz (41.5 kg)   LMP  (LMP Unknown)   SpO2 97%   BMI 17.89 kg/m   Wt Readings from Last 3 Encounters:  10/28/23 91 lb 9.6 oz (41.5 kg)  10/15/23 93 lb 12.8 oz (42.5 kg)  05/14/23 99 lb (44.9 kg)    Physical Exam Vitals and nursing note reviewed.  Constitutional:      General: She is awake. She is not in acute distress.    Appearance: She is well-developed, well-groomed and underweight. She is not ill-appearing or toxic-appearing.  Comments: Weight loss present.  HENT:     Head: Normocephalic.     Right Ear: External ear normal. Decreased hearing noted.     Left Ear: External ear normal. Decreased hearing noted.     Ears:     Comments: Very hard of hearing. Eyes:     General: Lids are normal.        Right eye: No discharge.        Left eye: No discharge.     Extraocular Movements: Extraocular movements intact.     Conjunctiva/sclera: Conjunctivae normal.     Pupils: Pupils are equal, round, and reactive to light.  Neck:     Thyroid: No thyromegaly.     Vascular: No carotid bruit.  Cardiovascular:     Rate and Rhythm: Normal rate and regular rhythm.     Heart sounds: Normal heart sounds. No murmur heard.    No gallop.   Pulmonary:     Effort: Pulmonary effort is normal. No accessory muscle usage or respiratory distress.     Breath sounds: Normal breath sounds. No decreased breath sounds, wheezing or rhonchi.  Abdominal:     General: Bowel sounds are normal. There is no distension.     Palpations: Abdomen is soft.     Tenderness: There is no abdominal tenderness.  Musculoskeletal:     Cervical back: Normal range of motion and neck supple.     Right lower leg: No edema.     Left lower leg: No edema.  Lymphadenopathy:     Cervical: No cervical adenopathy.  Skin:    General: Skin is warm and dry.  Neurological:     Mental Status: She is alert and oriented to person, place, and time.     Cranial Nerves: Cranial nerves 2-12 are intact.     Sensory: Sensation is intact.     Motor: Weakness (upper extremities bilateral) present.     Coordination: Finger-Nose-Finger Test and Heel to [place] Test normal.     Gait: Gait is intact.     Deep Tendon Reflexes: Reflexes are normal and symmetric.     Reflex Scores:      Brachioradialis reflexes are 2+ on the right side and 2+ on the left side.      Patellar reflexes are 2+ on the right side and 2+ on the left side.    Comments: Memory changes noted since previous visit. Having difficulty word finding, such as with feet calling them by other word.  When asked to squeeze can she reports does not like to "snake".  Not oriented to year, month, day.  Could not name what watch or pen are.  Psychiatric:        Attention and Perception: Attention normal.        Mood and Affect: Mood normal.        Speech: Speech normal.        Behavior: Behavior normal. Behavior is cooperative.        Thought Content: Thought content normal.    Results for orders placed or performed in visit on 10/15/23  Microscopic Examination   Collection Time: 10/15/23  2:36 PM   Urine  Result Value Ref Range   WBC, UA 0-5 0 - 5 /hpf   RBC, Urine 3-10 (A) 0 - 2 /hpf   Epithelial Cells (non renal)  0-10 0 - 10 /hpf   Casts Present (A) None seen /lpf   Cast Type Hyaline casts N/A   Mucus, UA Present (  A) Not Estab.   Bacteria, UA None seen None seen/Few  Urinalysis, Routine w reflex microscopic   Collection Time: 10/15/23  2:36 PM  Result Value Ref Range   Specific Gravity, UA >1.030 (H) 1.005 - 1.030   pH, UA 6.0 5.0 - 7.5   Color, UA Yellow Yellow   Appearance Ur Cloudy (A) Clear   Leukocytes,UA Trace (A) Negative   Protein,UA Trace (A) Negative/Trace   Glucose, UA Negative Negative   Ketones, UA Negative Negative   RBC, UA 2+ (A) Negative   Bilirubin, UA Negative Negative   Urobilinogen, Ur 0.2 0.2 - 1.0 mg/dL   Nitrite, UA Negative Negative   Microscopic Examination See below:   Ferritin   Collection Time: 10/15/23  2:37 PM  Result Value Ref Range   Ferritin 32 15 - 150 ng/mL  Iron   Collection Time: 10/15/23  2:37 PM  Result Value Ref Range   Iron 67 27 - 139 ug/dL  CBC with Differential/Platelet   Collection Time: 10/15/23  2:37 PM  Result Value Ref Range   WBC 5.2 3.4 - 10.8 x10E3/uL   RBC 4.24 3.77 - 5.28 x10E6/uL   Hemoglobin 11.9 11.1 - 15.9 g/dL   Hematocrit 29.5 28.4 - 46.6 %   MCV 88 79 - 97 fL   MCH 28.1 26.6 - 33.0 pg   MCHC 31.8 31.5 - 35.7 g/dL   RDW 13.2 44.0 - 10.2 %   Platelets 234 150 - 450 x10E3/uL   Neutrophils 64 Not Estab. %   Lymphs 23 Not Estab. %   Monocytes 11 Not Estab. %   Eos 1 Not Estab. %   Basos 1 Not Estab. %   Neutrophils Absolute 3.4 1.4 - 7.0 x10E3/uL   Lymphocytes Absolute 1.2 0.7 - 3.1 x10E3/uL   Monocytes Absolute 0.6 0.1 - 0.9 x10E3/uL   EOS (ABSOLUTE) 0.0 0.0 - 0.4 x10E3/uL   Basophils Absolute 0.1 0.0 - 0.2 x10E3/uL   Immature Granulocytes 0 Not Estab. %   Immature Grans (Abs) 0.0 0.0 - 0.1 x10E3/uL  Comprehensive metabolic panel   Collection Time: 10/15/23  2:37 PM  Result Value Ref Range   Glucose 100 (H) 70 - 99 mg/dL   BUN 18 10 - 36 mg/dL   Creatinine, Ser 7.25 0.57 - 1.00 mg/dL   eGFR 74 >36 UY/QIH/4.74    BUN/Creatinine Ratio 24 12 - 28   Sodium 141 134 - 144 mmol/L   Potassium 4.0 3.5 - 5.2 mmol/L   Chloride 103 96 - 106 mmol/L   CO2 24 20 - 29 mmol/L   Calcium 9.4 8.7 - 10.3 mg/dL   Total Protein 7.0 6.0 - 8.5 g/dL   Albumin 3.4 (L) 3.6 - 4.6 g/dL   Globulin, Total 3.6 1.5 - 4.5 g/dL   Bilirubin Total 0.3 0.0 - 1.2 mg/dL   Alkaline Phosphatase 60 44 - 121 IU/L   AST 15 0 - 40 IU/L   ALT 5 0 - 32 IU/L  VITAMIN D 25 Hydroxy (Vit-D Deficiency, Fractures)   Collection Time: 10/15/23  2:37 PM  Result Value Ref Range   Vit D, 25-Hydroxy 32.5 30.0 - 100.0 ng/mL  Urine Culture   Collection Time: 10/15/23  4:55 PM   Specimen: Urine   UR  Result Value Ref Range   Urine Culture, Routine Final report    Organism ID, Bacteria No growth       Assessment & Plan:   Problem List Items Addressed This Visit  Cardiovascular and Mediastinum   Hypertension - Primary   Chronic, stable.  BP at goal for age today with consistent medication use.  Her grand daughter who lives with her has taken over providing medications.  Continue current medication regimen and adjust as needed.  LABS: up to date.  Urine ALB 80 May 2024, maintain ACE for kidney protection.  Recommend she monitor BP at home and document + focus on DASH diet.           Other   Memory changes   Grand daughter reports some improvement since she took over medication and patient has been taking consistently.  Recent imaging and labs overall reassuring.  There is noted weight loss as well, they report less snacking but eating meals well.  Allergic to Alpha-d-galactosidase, would avoid Aricept at this time.  Suspect a lot of this comes from very poor hearing, she refuses to wear hearing aides and hid them over a year ago -- no one has found them.  Discussed with patient the benefit of wearing consistently.      Weight loss, abnormal   Family reports she is eating 3 meals, but not snacking as much.  Will add on supplement shakes  during daytime, discussed with family.  Recent labs reassuring.  Could consider further imaging in future if ongoing loss.           Follow up plan: Return in about 6 weeks (around 12/09/2023) for MEMORY AND WEIGHT.

## 2023-10-28 NOTE — Assessment & Plan Note (Signed)
Family reports she is eating 3 meals, but not snacking as much.  Will add on supplement shakes during daytime, discussed with family.  Recent labs reassuring.  Could consider further imaging in future if ongoing loss.

## 2023-10-28 NOTE — Assessment & Plan Note (Signed)
Grand daughter reports some improvement since she took over medication and patient has been taking consistently.  Recent imaging and labs overall reassuring.  There is noted weight loss as well, they report less snacking but eating meals well.  Allergic to Alpha-d-galactosidase, would avoid Aricept at this time.  Suspect a lot of this comes from very poor hearing, she refuses to wear hearing aides and hid them over a year ago -- no one has found them.  Discussed with patient the benefit of wearing consistently.

## 2023-10-28 NOTE — Assessment & Plan Note (Signed)
Chronic, stable.  BP at goal for age today with consistent medication use.  Her grand daughter who lives with her has taken over providing medications.  Continue current medication regimen and adjust as needed.  LABS: up to date.  Urine ALB 80 May 2024, maintain ACE for kidney protection.  Recommend she monitor BP at home and document + focus on DASH diet.

## 2023-12-07 NOTE — Patient Instructions (Incomplete)

## 2023-12-09 ENCOUNTER — Ambulatory Visit: Payer: Medicare HMO | Admitting: Nurse Practitioner

## 2024-01-16 ENCOUNTER — Other Ambulatory Visit: Payer: Self-pay | Admitting: Nurse Practitioner

## 2024-01-16 DIAGNOSIS — I10 Essential (primary) hypertension: Secondary | ICD-10-CM

## 2024-01-16 NOTE — Telephone Encounter (Signed)
 Copied from CRM [id number]. Topic: Clinical - Medication Refill >> Jan 16, 2024  3:31 PM Patsy Lager T wrote: Most Recent Primary Care Visit:  Provider: Aura Dials T  Department: [facility]  Visit Type: OFFICE VISIT  Date: 10/28/2023  Medication: benazepril (LOTENSIN) 5 MG tablet  Has the patient contacted their pharmacy? No  Is this the correct pharmacy for this prescription? Yes If no, delete pharmacy and type the correct one.  This is the patient's preferred pharmacy:   [facility] - [place], [place] - [address] [place] [address] Phone: [phone] Fax: [phone]  Has the prescription been filled recently? Yes  Is the patient out of the medication? Yes  Has the patient been seen for an appointment in the last year OR does the patient have an upcoming appointment? Yes  Can we respond through MyChart? No  Agent: Please be advised that Rx refills may take up to 3 business days. We ask that you follow-up with your pharmacy.  Patient will take her last pill today

## 2024-01-17 ENCOUNTER — Other Ambulatory Visit: Payer: Self-pay | Admitting: Nurse Practitioner

## 2024-01-17 DIAGNOSIS — I10 Essential (primary) hypertension: Secondary | ICD-10-CM

## 2024-01-17 NOTE — Telephone Encounter (Signed)
 Copied from CRM [id number]. Topic: Clinical - Medication Refill >> Jan 17, 2024  2:38 PM Carlatta H wrote: Most Recent Primary Care Visit:  Provider: Aura Dials T  Department: [facility]  Visit Type: OFFICE VISIT  Date: 10/28/2023  Medication: benazepril (LOTENSIN) 5 MG tablet [[id number]]  Has the patient contacted their pharmacy? Yes (Agent: If no, request that the patient contact the pharmacy for the refill. If patient does not wish to contact the pharmacy document the reason why and proceed with request.) (Agent: If yes, when and what did the pharmacy advise?)  Is this the correct pharmacy for this prescription? Yes If no, delete pharmacy and type the correct one.  This is the patient's preferred pharmacy:  [id number] - [place], [place] - [address] [place] [address] Phone: [phone] Fax: [phone]     Has the prescription been filled recently? No  Is the patient out of the medication? Yes  Has the patient been seen for an appointment in the last year OR does the patient have an upcoming appointment? Yes  Can we respond through MyChart? No  Agent: Please be advised that Rx refills may take up to 3 business days. We ask that you follow-up with your pharmacy.

## 2024-01-17 NOTE — Telephone Encounter (Signed)
 Request too soon, last ordered 04/02/23 #90, 4 refills  Requested Prescriptions  Pending Prescriptions Disp Refills   benazepril (LOTENSIN) 5 MG tablet 90 tablet 4    Sig: Take 1 tablet (5 mg total) by mouth daily.     Cardiovascular:  ACE Inhibitors Passed - 01/17/2024 11:06 AM      Passed - Cr in normal range and within 180 days    Creatinine  Date Value Ref Range Status  07/16/2012 0.66 0.60 - 1.30 mg/dL Final   Creatinine, Ser  Date Value Ref Range Status  10/15/2023 0.74 0.57 - 1.00 mg/dL Final         Passed - K in normal range and within 180 days    Potassium  Date Value Ref Range Status  10/15/2023 4.0 3.5 - 5.2 mmol/L Final  07/16/2012 3.9 3.5 - 5.1 mmol/L Final         Passed - Patient is not pregnant      Passed - Last BP in normal range    BP Readings from Last 1 Encounters:  10/28/23 113/65         Passed - Valid encounter within last 6 months    Recent Outpatient Visits           2 months ago Primary hypertension   [place] [facility] [place], Corrie Dandy T, NP   3 months ago Senile purpura (HCC)   [place] [facility] Marjie Skiff, NP   8 months ago Hospital discharge follow-up   [place] [facility], Oswaldo Conroy, PA-C   9 months ago Medicare annual wellness visit, subsequent   [place] [facility] [place], Corrie Dandy T, NP   1 year ago Aortic atherosclerosis [facility])    [facility] [place], Dorie Rank, NP

## 2024-01-20 MED ORDER — BENAZEPRIL HCL 5 MG PO TABS: 5.0000 mg | ORAL_TABLET | Freq: Every day | ORAL | 0 refills | Status: DC

## 2024-01-20 NOTE — Telephone Encounter (Signed)
 Requested Prescriptions  Pending Prescriptions Disp Refills   benazepril (LOTENSIN) 5 MG tablet 90 tablet 0    Sig: Take 1 tablet (5 mg total) by mouth daily.     Cardiovascular:  ACE Inhibitors Passed - 01/20/2024 10:22 AM      Passed - Cr in normal range and within 180 days    Creatinine  Date Value Ref Range Status  07/16/2012 0.66 0.60 - 1.30 mg/dL Final   Creatinine, Ser  Date Value Ref Range Status  10/15/2023 0.74 0.57 - 1.00 mg/dL Final         Passed - K in normal range and within 180 days    Potassium  Date Value Ref Range Status  10/15/2023 4.0 3.5 - 5.2 mmol/L Final  07/16/2012 3.9 3.5 - 5.1 mmol/L Final         Passed - Patient is not pregnant      Passed - Last BP in normal range    BP Readings from Last 1 Encounters:  10/28/23 113/65         Passed - Valid encounter within last 6 months    Recent Outpatient Visits           2 months ago Primary hypertension   [place] [facility] [place], Corrie Dandy T, NP   3 months ago Senile purpura (HCC)   [place] [facility] Marjie Skiff, NP   8 months ago Hospital discharge follow-up   [place] [facility]ecum, Oswaldo Conroy, PA-C   9 months ago Medicare annual wellness visit, subsequent   [place] [facility] [place], Corrie Dandy T, NP   1 year ago Aortic atherosclerosis [facility])   [place] [facility] [place], Dorie Rank, NP

## 2024-02-29 NOTE — Patient Instructions (Signed)

## 2024-03-02 ENCOUNTER — Ambulatory Visit (INDEPENDENT_AMBULATORY_CARE_PROVIDER_SITE_OTHER): Admitting: Nurse Practitioner

## 2024-03-02 ENCOUNTER — Encounter: Payer: Self-pay | Admitting: Nurse Practitioner

## 2024-03-02 VITALS — BP 134/78 | HR 71 | Temp 99.3°F | Ht 60.0 in | Wt 87.8 lb

## 2024-03-02 DIAGNOSIS — R634 Abnormal weight loss: Secondary | ICD-10-CM

## 2024-03-02 DIAGNOSIS — R8281 Pyuria: Secondary | ICD-10-CM

## 2024-03-02 DIAGNOSIS — I10 Essential (primary) hypertension: Secondary | ICD-10-CM

## 2024-03-02 DIAGNOSIS — D692 Other nonthrombocytopenic purpura: Secondary | ICD-10-CM | POA: Diagnosis not present

## 2024-03-02 DIAGNOSIS — G319 Degenerative disease of nervous system, unspecified: Secondary | ICD-10-CM | POA: Insufficient documentation

## 2024-03-02 DIAGNOSIS — R413 Other amnesia: Secondary | ICD-10-CM | POA: Diagnosis not present

## 2024-03-02 LAB — URINALYSIS, ROUTINE W REFLEX MICROSCOPIC
Bilirubin, UA: NEGATIVE
Glucose, UA: NEGATIVE
Nitrite, UA: NEGATIVE
Protein,UA: NEGATIVE
Specific Gravity, UA: 1.02 (ref 1.005–1.030)
Urobilinogen, Ur: 0.2 mg/dL (ref 0.2–1.0)
pH, UA: 7 (ref 5.0–7.5)

## 2024-03-02 LAB — MICROSCOPIC EXAMINATION: Bacteria, UA: NONE SEEN

## 2024-03-02 MED ORDER — MIRTAZAPINE 15 MG PO TBDP: 7.5000 mg | ORAL_TABLET | Freq: Every day | ORAL | 2 refills | Status: DC

## 2024-03-02 NOTE — Assessment & Plan Note (Signed)
 Ongoing loss present.  Family reports she is eating 3 meals, but not snacking as much. Continue supplement shakes during daytime, discussed with family.  Recent labs reassuring. Start Mirtazapine  dissolvable form at night for sleep and appetite, educated family on this. Could consider further imaging in future if ongoing loss, however suspect much related to current memory loss.

## 2024-03-02 NOTE — Assessment & Plan Note (Addendum)
 Worsening.  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  She is interested in LTC placement.  Emergent referral to SW placed.  Patient would benefit from LTC setting at this time based on her current memory loss and difficulty with care. There is ongoing weight loss present, they report good meal intake.  Allergic to Alpha-d-galactosidase, would avoid Aricept at this time.  Start Mirtazapine  dissolvable form at night for sleep and appetite, educated family on this. - Check urine for infection, which could exacerbate memory changes

## 2024-03-02 NOTE — Assessment & Plan Note (Signed)
 Noted on CT scan 10/15/23 with continued memory loss presenting.  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  She is interested in LTC placement.  Emergent referral to SW placed.  Patient would benefit from LTC setting at this time based on her current memory loss and difficulty with care.

## 2024-03-02 NOTE — Assessment & Plan Note (Signed)
Ongoing, stable with no skin breakdown.  Recommend gentle skin care and monitor for wounds, if present then notify provider. 

## 2024-03-02 NOTE — Progress Notes (Signed)
 BP 134/78 (BP Location: Left Arm, Patient Position: Sitting, Cuff Size: Normal)   Pulse 71   Temp 99.3 F (37.4 C) (Oral)   Ht 5' (1.524 m)   Wt 87 lb 12.8 oz (39.8 kg)   LMP  (LMP Unknown)   SpO2 96%   BMI 17.15 kg/m    Subjective:    Patient ID: Rhonda Ingram, female    DOB: 22-May-1926, 89 y.o.   MRN: [id number]  HPI: Rhonda Ingram is a 89 y.o. female  Chief Complaint  Patient presents with   Memory Loss    Patient's granddaughter states that she has noticed a lot of memory loss in the last 6 months. States the patient is no longer able to do anything on her own.    Grand daughter at bedside.  HYPERTENSION & MEMORY Continues on Benazepril  daily, family assists to ensure she takes medication daily.  Currently no memory aide medication, allergic to Alpha-d-galactosidase.  Lives with her grand daughter, Janalyn Me, and she reports symptoms are worsening.  Symptoms have worsened since last visit 10/28/23 per grand daughter.  CT head on 10/15/23 showed no acute findings, but did note age related cerebral atrophy and periventricular white matter changes.  Calls her grand daughter her sister who passed away or a caregiver. Cannot be left alone while grand daughter works.  She will rummage through everything in house, cannot find her hearing aides.  Is eating three meals a day, will drink one supplement a day.  Patient is letting them put pull ups on her + is fully incontinent.  No recent falls or injuries.  No hallucinations.  Is having episodes of anger and irritation at family. Does not know her age anymore. Satisfied with current treatment? yes Duration of hypertension: chronic BP monitoring frequency: not checking BP range:  BP medication side effects: no Aspirin: no Recent stressors: no Recurrent headaches: no Visual changes: no Palpitations: no Dyspnea: no Chest pain: no Lower extremity edema: no Dizzy/lightheaded: no    03/02/2024    3:06 PM 10/28/2023    4:38 PM  10/15/2023    2:11 PM 04/10/2023    3:43 PM 01/01/2022    9:23 AM  6CIT Screen  What Year? 4 points 0 points 4 points 0 points 0 points  What month? 3 points 0 points 3 points 0 points 0 points  What time? 3 points 3 points  0 points 0 points  Count back from 20 4 points 4 points 4 points 0 points 0 points  Months in reverse 4 points 4 points 4 points 0 points 4 points  Repeat phrase 10 points 2 points 10 points 2 points 0 points  Total Score 28 points 13 points  2 points 4 points   Relevant past medical, surgical, family and social history reviewed and updated as indicated. Interim medical history since our last visit reviewed. Allergies and medications reviewed and updated.  Review of Systems  Constitutional:  Positive for unexpected weight change. Negative for activity change, appetite change, diaphoresis and fatigue.  Respiratory:  Negative for cough, chest tightness, shortness of breath and wheezing.   Cardiovascular:  Negative for chest pain, palpitations and leg swelling.  Gastrointestinal: Negative.   Endocrine: Negative.   Genitourinary: Negative.   Neurological:  Negative for dizziness, tremors, syncope, facial asymmetry, speech difficulty, weakness, numbness and headaches.  Psychiatric/Behavioral:  Positive for confusion (memory loss) and sleep disturbance. Negative for self-injury and suicidal ideas. The patient is not nervous/anxious.    Per  HPI unless specifically indicated above     Objective:    BP 134/78 (BP Location: Left Arm, Patient Position: Sitting, Cuff Size: Normal)   Pulse 71   Temp 99.3 F (37.4 C) (Oral)   Ht 5' (1.524 m)   Wt 87 lb 12.8 oz (39.8 kg)   LMP  (LMP Unknown)   SpO2 96%   BMI 17.15 kg/m   Wt Readings from Last 3 Encounters:  03/02/24 87 lb 12.8 oz (39.8 kg)  10/28/23 91 lb 9.6 oz (41.5 kg)  10/15/23 93 lb 12.8 oz (42.5 kg)    Physical Exam Vitals and nursing note reviewed.  Constitutional:      General: She is awake. She is not in  acute distress.    Appearance: She is well-developed, well-groomed and underweight. She is not ill-appearing or toxic-appearing.  HENT:     Head: Normocephalic.     Right Ear: Hearing and external ear normal.     Left Ear: Hearing and external ear normal.  Eyes:     General: Lids are normal.        Right eye: No discharge.        Left eye: No discharge.     Conjunctiva/sclera: Conjunctivae normal.     Pupils: Pupils are equal, round, and reactive to light.  Neck:     Thyroid : No thyromegaly.     Vascular: No carotid bruit.  Cardiovascular:     Rate and Rhythm: Normal rate and regular rhythm.     Heart sounds: Normal heart sounds. No murmur heard.    No gallop.  Pulmonary:     Effort: Pulmonary effort is normal. No accessory muscle usage or respiratory distress.     Breath sounds: Normal breath sounds.  Abdominal:     General: Bowel sounds are normal. There is no distension.     Palpations: Abdomen is soft.     Tenderness: There is no abdominal tenderness.  Musculoskeletal:     Cervical back: Normal range of motion and neck supple.     Right lower leg: No edema.     Left lower leg: No edema.  Lymphadenopathy:     Cervical: No cervical adenopathy.  Skin:    General: Skin is warm and dry.  Neurological:     Mental Status: She is alert.     Cranial Nerves: Cranial nerves 2-12 are intact.     Deep Tendon Reflexes: Reflexes are normal and symmetric.     Reflex Scores:      Brachioradialis reflexes are 2+ on the right side and 2+ on the left side.      Patellar reflexes are 2+ on the right side and 2+ on the left side.    Comments: Strength BUE 3/5 and BLE 3/5.  She is not oriented to provider, city, time, year, or month.  Reports she is 42 and then will report she is 89.  Some occasional difficulty word finding.  Psychiatric:        Attention and Perception: Attention normal.        Mood and Affect: Mood normal.        Speech: Speech normal.        Behavior: Behavior normal.  Behavior is cooperative.        Cognition and Memory: Memory is impaired.    Results for orders placed or performed in visit on 03/02/24  Microscopic Examination   Collection Time: 03/02/24  3:18 PM   Urine  Result Value Ref Range  WBC, UA 11-30 (A) 0 - 5 /hpf   RBC, Urine 0-2 0 - 2 /hpf   Epithelial Cells (non renal) 0-10 0 - 10 /hpf   Mucus, UA Present (A) Not Estab.   Bacteria, UA None seen None seen/Few  Urinalysis, Routine w reflex microscopic   Collection Time: 03/02/24  3:18 PM  Result Value Ref Range   Specific Gravity, UA 1.020 1.005 - 1.030   pH, UA 7.0 5.0 - 7.5   Color, UA Yellow Yellow   Appearance Ur Cloudy (A) Clear   Leukocytes,UA 3+ (A) Negative   Protein,UA Negative Negative/Trace   Glucose, UA Negative Negative   Ketones, UA Trace (A) Negative   RBC, UA 1+ (A) Negative   Bilirubin, UA Negative Negative   Urobilinogen, Ur 0.2 0.2 - 1.0 mg/dL   Nitrite, UA Negative Negative   Microscopic Examination See below:       Assessment & Plan:   Problem List Items Addressed This Visit       Cardiovascular and Mediastinum   Senile purpura (HCC)   Ongoing, stable with no skin breakdown.  Recommend gentle skin care and monitor for wounds, if present then notify provider.      Hypertension   Chronic, stable.  BP at goal for age today with consistent medication use.  Her grand daughter who lives with her has taken over providing medications.  Continue current medication regimen and adjust as needed.  LABS: CBC, CMP, TSH.  Urine ALB 80 May 2024, maintain ACE for kidney protection.  Recommend she monitor BP at home and document + focus on DASH diet.           Nervous and Auditory   Cerebral atrophy (HCC) - Primary   Noted on CT scan 10/15/23 with continued memory loss presenting.  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  She is interested in LTC placement.  Emergent referral to SW placed.  Patient would  benefit from LTC setting at this time based on her current memory loss and difficulty with care.      Relevant Orders   AMB Referral [facility] Care Management   Urinalysis, Routine w reflex microscopic (Completed)     Other   Weight loss, abnormal   Ongoing loss present.  Family reports she is eating 3 meals, but not snacking as much. Continue supplement shakes during daytime, discussed with family.  Recent labs reassuring. Start Mirtazapine  dissolvable form at night for sleep and appetite, educated family on this. Could consider further imaging in future if ongoing loss, however suspect much related to current memory loss.      Memory changes   Worsening.  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  She is interested in LTC placement.  Emergent referral to SW placed.  Patient would benefit from LTC setting at this time based on her current memory loss and difficulty with care. There is ongoing weight loss present, they report good meal intake.  Allergic to Alpha-d-galactosidase, would avoid Aricept at this time.  Start Mirtazapine  dissolvable form at night for sleep and appetite, educated family on this. - Check urine for infection, which could exacerbate memory changes      Relevant Orders   AMB Referral VBCI Care Management   Urinalysis, Routine w reflex microscopic (Completed)   TSH   Comprehensive metabolic panel with GFR   CBC with Differential/Platelet   Other Visit  Diagnoses       Pyuria       Urine sent for culture and will treat as needed.   Relevant Orders   Urine Culture          Follow up plan: Return in 4 weeks (on 03/30/2024) for WEIGHT CHECK.

## 2024-03-02 NOTE — Assessment & Plan Note (Signed)
 Chronic, stable.  BP at goal for age today with consistent medication use.  Her grand daughter who lives with her has taken over providing medications.  Continue current medication regimen and adjust as needed.  LABS: CBC, CMP, TSH.  Urine ALB 80 May 2024, maintain ACE for kidney protection.  Recommend she monitor BP at home and document + focus on DASH diet.

## 2024-03-03 LAB — CBC WITH DIFFERENTIAL/PLATELET
Basophils Absolute: 0.1 10*3/uL (ref 0.0–0.2)
Basos: 1 %
EOS (ABSOLUTE): 0 10*3/uL (ref 0.0–0.4)
Eos: 1 %
Hematocrit: 39.6 % (ref 34.0–46.6)
Hemoglobin: 13.2 g/dL (ref 11.1–15.9)
Immature Grans (Abs): 0 10*3/uL (ref 0.0–0.1)
Immature Granulocytes: 0 %
Lymphocytes Absolute: 1.1 10*3/uL (ref 0.7–3.1)
Lymphs: 22 %
MCH: 29.3 pg (ref 26.6–33.0)
MCHC: 33.3 g/dL (ref 31.5–35.7)
MCV: 88 fL (ref 79–97)
Monocytes Absolute: 0.5 10*3/uL (ref 0.1–0.9)
Monocytes: 9 %
Neutrophils Absolute: 3.3 10*3/uL (ref 1.4–7.0)
Neutrophils: 67 %
Platelets: 224 10*3/uL (ref 150–450)
RBC: 4.51 x10E6/uL (ref 3.77–5.28)
RDW: 12.4 % (ref 11.7–15.4)
WBC: 4.9 10*3/uL (ref 3.4–10.8)

## 2024-03-03 LAB — COMPREHENSIVE METABOLIC PANEL WITH GFR
ALT: 5 IU/L (ref 0–32)
AST: 14 IU/L (ref 0–40)
Albumin: 3.5 g/dL — ABNORMAL LOW (ref 3.6–4.6)
Alkaline Phosphatase: 63 IU/L (ref 44–121)
BUN/Creatinine Ratio: 17 (ref 12–28)
BUN: 15 mg/dL (ref 10–36)
Bilirubin Total: 0.4 mg/dL (ref 0.0–1.2)
CO2: 24 mmol/L (ref 20–29)
Calcium: 9.3 mg/dL (ref 8.7–10.3)
Chloride: 102 mmol/L (ref 96–106)
Creatinine, Ser: 0.86 mg/dL (ref 0.57–1.00)
Globulin, Total: 3.3 g/dL (ref 1.5–4.5)
Glucose: 100 mg/dL — ABNORMAL HIGH (ref 70–99)
Potassium: 4.3 mmol/L (ref 3.5–5.2)
Sodium: 137 mmol/L (ref 134–144)
Total Protein: 6.8 g/dL (ref 6.0–8.5)
eGFR: 62 mL/min/{1.73_m2} (ref >59)

## 2024-03-03 LAB — TSH: TSH: 2.73 u[IU]/mL (ref 0.450–4.500)

## 2024-03-03 NOTE — Progress Notes (Signed)
 Good morning, please let Carolanne's family know her labs have returned and overall these remain stable. No medication changes needed and nothing to explain worsening in memory changes.  I did obtain urine and am sending for culture to ensure no infection.  If one present I will let them know:) Keep being amazing!!  Thank you for allowing me to participate in your care.  I appreciate you. Kindest regards, Miko Markwood

## 2024-03-05 ENCOUNTER — Telehealth: Payer: Self-pay

## 2024-03-05 LAB — URINE CULTURE

## 2024-03-05 NOTE — Progress Notes (Signed)
 Complex Care Management Note  Care Guide Note 03/05/2024 Name: FRANCENA ZENDER MRN: [id number] DOB: Sep 08, 1926  Lubertha Rush Westrup is a 89 y.o. year old female who sees [place], Sanjuan Crumbly T, NP for primary care. I reached out to Germaine Kohut by phone today to offer complex care management services.  Ms. Knee was given information about Complex Care Management services today including:   The Complex Care Management services include support from the care team which includes your Nurse Care Manager, Clinical Social Worker, or Pharmacist.  The Complex Care Management team is here to help remove barriers to the health concerns and goals most important to you. Complex Care Management services are voluntary, and the patient may decline or stop services at any time by request to their care team member.   Complex Care Management Consent Status: Patient agreed to services and verbal consent obtained.   Follow up plan:  Telephone appointment with complex care management team member scheduled for:  03/05/2024  Encounter Outcome:  Patient Scheduled Lenton Rail , RMA     [place]  [facility], [facility] Guide  Direct Dial: [phone]  Website: [email or web address]

## 2024-03-09 ENCOUNTER — Other Ambulatory Visit: Payer: Self-pay | Admitting: Licensed Clinical Social Worker

## 2024-03-09 NOTE — Patient Outreach (Signed)
 Complex Care Management   Visit Note  03/09/2024  Name:  Rhonda Ingram MRN: [id number] DOB: [birth year]  Situation: Referral received for Complex Care Management related to  long term care placement  I obtained verbal consent from Caregiver.  Visit completed with C Fisher  on the phone  Background:   Past Medical History:  Diagnosis Date   Arthritis    osteoarthritis   Hyperlipidemia    Hypertension    Osteoporosis    Vitamin D  deficiency     Assessment: Patient Reported Symptoms:  Cognitive        Neurological      HEENT        Cardiovascular      Respiratory      Endocrine      Gastrointestinal        Genitourinary      Integumentary      Musculoskeletal          Psychosocial       Do you feel physically threatened by others?: No      03/02/2024    2:57 PM  Depression screen PHQ 2/9  Decreased Interest 3  Down, Depressed, Hopeless 3  PHQ - 2 Score 6  Altered sleeping 3  Tired, decreased energy 3  Change in appetite 3  Feeling bad or failure about yourself  0  Trouble concentrating 3  Moving slowly or fidgety/restless 3  Suicidal thoughts 0  PHQ-9 Score 21  Difficult doing work/chores Extremely dIfficult    There were no vitals filed for this visit.  Medications Reviewed Today   Medications were not reviewed in this encounter     Recommendation:   Grand daughter [name] encouraged to apply for long term care medicaid   Follow Up Plan:   Telephone follow up appointment date/time:  03/23/2024  11am  Fletcher Humble MSW, LCSW Licensed Clinical Social Worker  [facility], Population Health Direct Dial: [phone]  Fax: [phone]

## 2024-03-09 NOTE — Patient Instructions (Signed)
 Visit Information  Thank you for taking time to visit with me today. Please don't hesitate to contact me if I can be of assistance to you before our next scheduled appointment.  Your next care management appointment is by telephone on 03/23/2024 at 11am  Telephone follow up appointment date/time:  03/23/2024 11am  Please call the care guide team at [phone] if you need to cancel, schedule, or reschedule an appointment.   Please call 911 if you are experiencing a Mental Health or Behavioral Health Crisis or need someone to talk to.  Fletcher Humble MSW, LCSW Licensed Clinical Social Worker  [facility], Population Health Direct Dial: [phone]  Fax: [phone]

## 2024-03-12 ENCOUNTER — Encounter: Payer: Self-pay | Admitting: Nurse Practitioner

## 2024-03-23 ENCOUNTER — Other Ambulatory Visit: Payer: Self-pay | Admitting: Licensed Clinical Social Worker

## 2024-03-23 NOTE — Patient Outreach (Signed)
 Complex Care Management   Visit Note  03/23/2024  Name:  Rhonda Ingram MRN: [id number] DOB: 06-Sep-1926  Situation: Referral received for Complex Care Management related to assistance with snf placement I obtained verbal consent from Caregiver.  Visit completed with C Ingram  on the phone  Background:   Past Medical History:  Diagnosis Date   Arthritis    osteoarthritis   Hyperlipidemia    Hypertension    Osteoporosis    Vitamin D  deficiency     Assessment: Patient Reported Symptoms: Unable to assess Rhonda Ingram called was completed with grand daughter Rhonda Ingram. Rhonda Ingram was unavailable to complete ph visit.   Cognitive Cognitive Status: Unable to Assess Cognitive/Intellectual Conditions Management [RPT]: Not Assessed      Neurological Neurological Review of Symptoms: Not assessed    HEENT HEENT Symptoms Reported: Not assessed      Cardiovascular      Respiratory Respiratory Symptoms Reported: Not assesed    Endocrine Patient reports the following symptoms related to hypoglycemia or hyperglycemia : Not assessed    Gastrointestinal Gastrointestinal Symptoms Reported: Not assessed      Genitourinary Genitourinary Symptoms Reported: Not assessed    Integumentary Integumentary Symptoms Reported: Not assessed    Musculoskeletal Musculoskelatal Symptoms Reviewed: Not assessed        Psychosocial Psychosocial Symptoms Reported: Not assessed     Do you feel physically threatened by others?: No      03/02/2024    2:57 PM  Depression screen PHQ 2/9  Decreased Interest 3  Down, Depressed, Hopeless 3  PHQ - 2 Score 6  Altered sleeping 3  Tired, decreased energy 3  Change in appetite 3  Feeling bad or failure about yourself  0  Trouble concentrating 3  Moving slowly or fidgety/restless 3  Suicidal thoughts 0  PHQ-9 Score 21  Difficult doing work/chores Extremely dIfficult    There were no vitals filed for this visit.  Medications Reviewed Today    Medications were not reviewed in this encounter     Recommendation:   Grand daughter Rhonda Ingram has a completed copy of the FL2 Form. Ms Rhonda Ingram reports she will bring completed FL2 along with application and requested documents to [facility] by end of next week.   Follow Up Plan:   Telephone follow up appointment date/time:  04/08/2024  Fletcher Humble MSW, LCSW Licensed Clinical Social Worker  [facility], Population Health Direct Dial: [phone]  Fax: [phone]

## 2024-03-23 NOTE — Patient Instructions (Signed)
 Visit Information  Thank you for taking time to visit with me today. Please don't hesitate to contact me if I can be of assistance to you before our next scheduled appointment.  Your next care management appointment is by telephone on 04/08/2024 at 10am  Telephone follow up appointment date/time:  04/08/2024 10am  Please call the care guide team at [phone] if you need to cancel, schedule, or reschedule an appointment.   Please call 911 if you are experiencing a Mental Health or Behavioral Health Crisis or need someone to talk to.  Fletcher Humble MSW, LCSW Licensed Clinical Social Worker  [facility], Population Health Direct Dial: [phone]  Fax: [phone]

## 2024-03-24 ENCOUNTER — Other Ambulatory Visit: Payer: Self-pay | Admitting: Nurse Practitioner

## 2024-04-03 ENCOUNTER — Telehealth: Payer: Self-pay | Admitting: Nurse Practitioner

## 2024-04-03 NOTE — Telephone Encounter (Signed)
 Patient's granddaughter Deann Exon came in today wanting to follow up on her request to receive the DNR for her grandmother. I did not see where the request had been previously submitted. She stated that she needs this as soon as possible.

## 2024-04-03 NOTE — Telephone Encounter (Signed)
 Routing to provider

## 2024-04-07 NOTE — Telephone Encounter (Signed)
 Called and notified patient's granddaughter that the DNR form has been completed and is ready to be picked up.

## 2024-04-08 ENCOUNTER — Other Ambulatory Visit: Payer: Self-pay | Admitting: Licensed Clinical Social Worker

## 2024-04-14 ENCOUNTER — Telehealth: Payer: Self-pay

## 2024-04-14 NOTE — Telephone Encounter (Signed)
 Patients daughter came into office this afternoon to request an updated FL2 form to be completed so her mom can be admitted into [facility]. The current one expired on 04/10/2024. She has been advised normal turn around is 7 business days but she did ask that it be done sooner, if able.

## 2024-04-14 NOTE — Telephone Encounter (Signed)
 Patient's grandson came in to bring the fax number to [facility] to have the [facility] 2 form sent to. [place] Fax# [phone]

## 2024-04-15 ENCOUNTER — Telehealth: Payer: Self-pay

## 2024-04-15 NOTE — Telephone Encounter (Signed)
 Copied from CRM [id number]. Topic: General - Other >> Apr 15, 2024 10:26 AM Rennis Case wrote: Reason for CRM: Carron Clap @ [employer] calling to speak to [place] about form received. Gaylia Kayser states form received was the incorrect form and she has faxed correct form to office. Shanelle Requesting call back: [phone]

## 2024-04-15 NOTE — Telephone Encounter (Signed)
 Informed Rhonda Ingram,Faxed received and is being worked on and will be sent when completed.

## 2024-04-16 ENCOUNTER — Other Ambulatory Visit: Payer: Self-pay | Admitting: Nurse Practitioner

## 2024-04-16 DIAGNOSIS — I10 Essential (primary) hypertension: Secondary | ICD-10-CM

## 2024-04-16 NOTE — Telephone Encounter (Signed)
 Will complete the correct form, have Jolene review and sign, and then fax back.

## 2024-04-17 NOTE — Telephone Encounter (Signed)
  Shanelle States pt is needing to move in today and needing form faxed back. Pt unable to move in until form is received.   Trellis Fries w/ [facility] requesting call back from [place], [phone].

## 2024-04-17 NOTE — Telephone Encounter (Signed)
 Requested Prescriptions  Pending Prescriptions Disp Refills   benazepril  (LOTENSIN ) 5 MG tablet [Pharmacy Med Name: BENAZEPRIL  HCL 5 MG TABLET] 90 tablet 1    Sig: TAKE 1 TABLET (5 MG TOTAL) BY MOUTH DAILY.     Cardiovascular:  ACE Inhibitors Failed - 04/17/2024  9:12 AM      Failed - Valid encounter within last 6 months    Recent Outpatient Visits           1 month ago Cerebral atrophy (HCC)   [facility], Dillon T, NP              Passed - Cr in normal range and within 180 days    Creatinine  Date Value Ref Range Status  07/16/2012 0.66 0.60 - 1.30 mg/dL Final   Creatinine, Ser  Date Value Ref Range Status  03/02/2024 0.86 0.57 - 1.00 mg/dL Final         Passed - K in normal range and within 180 days    Potassium  Date Value Ref Range Status  03/02/2024 4.3 3.5 - 5.2 mmol/L Final  07/16/2012 3.9 3.5 - 5.1 mmol/L Final         Passed - Patient is not pregnant      Passed - Last BP in normal range    BP Readings from Last 1 Encounters:  03/02/24 134/78

## 2024-04-17 NOTE — Telephone Encounter (Signed)
 New form has been corrected and sent to [facility]. Called and notified Gaylia Kayser of this and to be expecting the form.

## 2024-04-27 ENCOUNTER — Telehealth: Payer: Self-pay

## 2024-04-27 NOTE — Progress Notes (Unsigned)
 Complex Care Management Care Guide Note  04/27/2024 Name: Rhonda Ingram MRN: [id number] DOB: 01/18/1926  [name] is a 89 y.o. year old female who is a primary care patient of [name], Jolene T, NP and is actively engaged with the care management team. I reached out to [facility] by phone today to assist with re-scheduling  with the Licensed Clinical Child psychotherapist.  Follow up plan: Unsuccessful telephone outreach attempt made. A HIPAA compliant phone message was left for the patient providing contact information and requesting a return call.  Lenton Rail , RMA     [facility]  [facility], [facility] Guide  Direct Dial: [phone]  Website: [email or web address]

## 2024-04-29 NOTE — Progress Notes (Signed)
 Complex Care Management Care Guide Note  04/29/2024 Name: Rhonda Ingram MRN: [id number] DOB: 1926/04/22  Rhonda Ingram is a 89 y.o. year old female who is a primary care patient of [name], Jolene T, NP and is actively engaged with the care management team. I reached out to [facility] by phone today to assist with re-scheduling  with the BSW.  Follow up plan: Unsuccessful telephone outreach attempt made. A HIPAA compliant phone message was left for the patient providing contact information and requesting a return call.  Lenton Rail , RMA     [facility]  [facility], [facility] Guide  Direct Dial: [phone]  Website: [email or web address]

## 2024-06-16 DIAGNOSIS — I1310 Hypertensive heart and chronic kidney disease without heart failure, with stage 1 through stage 4 chronic kidney disease, or unspecified chronic kidney disease: Secondary | ICD-10-CM | POA: Diagnosis not present

## 2024-06-16 DIAGNOSIS — I739 Peripheral vascular disease, unspecified: Secondary | ICD-10-CM | POA: Diagnosis not present

## 2024-06-16 DIAGNOSIS — E440 Moderate protein-calorie malnutrition: Secondary | ICD-10-CM | POA: Diagnosis not present

## 2024-06-16 DIAGNOSIS — Z79899 Other long term (current) drug therapy: Secondary | ICD-10-CM | POA: Diagnosis not present

## 2024-06-16 DIAGNOSIS — N1831 Chronic kidney disease, stage 3a: Secondary | ICD-10-CM | POA: Diagnosis not present

## 2024-06-16 DIAGNOSIS — F03C18 Unspecified dementia, severe, with other behavioral disturbance: Secondary | ICD-10-CM | POA: Diagnosis not present

## 2024-06-23 DIAGNOSIS — R32 Unspecified urinary incontinence: Secondary | ICD-10-CM | POA: Diagnosis not present

## 2024-06-23 DIAGNOSIS — N390 Urinary tract infection, site not specified: Secondary | ICD-10-CM | POA: Diagnosis not present

## 2024-06-23 DIAGNOSIS — F03C11 Unspecified dementia, severe, with agitation: Secondary | ICD-10-CM | POA: Diagnosis not present

## 2024-06-23 DIAGNOSIS — R638 Other symptoms and signs concerning food and fluid intake: Secondary | ICD-10-CM | POA: Diagnosis not present

## 2024-07-02 DIAGNOSIS — R32 Unspecified urinary incontinence: Secondary | ICD-10-CM | POA: Diagnosis not present

## 2024-07-08 DIAGNOSIS — F03C11 Unspecified dementia, severe, with agitation: Secondary | ICD-10-CM | POA: Diagnosis not present

## 2024-07-10 DIAGNOSIS — F0390 Unspecified dementia without behavioral disturbance: Secondary | ICD-10-CM | POA: Diagnosis not present

## 2024-07-10 DIAGNOSIS — F39 Unspecified mood [affective] disorder: Secondary | ICD-10-CM | POA: Diagnosis not present

## 2024-07-14 DIAGNOSIS — I1310 Hypertensive heart and chronic kidney disease without heart failure, with stage 1 through stage 4 chronic kidney disease, or unspecified chronic kidney disease: Secondary | ICD-10-CM | POA: Diagnosis not present

## 2024-07-14 DIAGNOSIS — F05 Delirium due to known physiological condition: Secondary | ICD-10-CM | POA: Diagnosis not present

## 2024-07-14 DIAGNOSIS — E440 Moderate protein-calorie malnutrition: Secondary | ICD-10-CM | POA: Diagnosis not present

## 2024-07-14 DIAGNOSIS — F03C18 Unspecified dementia, severe, with other behavioral disturbance: Secondary | ICD-10-CM | POA: Diagnosis not present

## 2024-07-14 DIAGNOSIS — Z515 Encounter for palliative care: Secondary | ICD-10-CM | POA: Diagnosis not present

## 2024-07-14 DIAGNOSIS — N1831 Chronic kidney disease, stage 3a: Secondary | ICD-10-CM | POA: Diagnosis not present

## 2024-07-15 DIAGNOSIS — B351 Tinea unguium: Secondary | ICD-10-CM | POA: Diagnosis not present

## 2024-07-15 DIAGNOSIS — M79674 Pain in right toe(s): Secondary | ICD-10-CM | POA: Diagnosis not present

## 2024-07-15 DIAGNOSIS — R2681 Unsteadiness on feet: Secondary | ICD-10-CM | POA: Diagnosis not present

## 2024-07-15 DIAGNOSIS — M79675 Pain in left toe(s): Secondary | ICD-10-CM | POA: Diagnosis not present

## 2024-07-15 DIAGNOSIS — F0390 Unspecified dementia without behavioral disturbance: Secondary | ICD-10-CM | POA: Diagnosis not present

## 2024-07-15 DIAGNOSIS — L608 Other nail disorders: Secondary | ICD-10-CM | POA: Diagnosis not present

## 2024-07-22 DIAGNOSIS — F03C11 Unspecified dementia, severe, with agitation: Secondary | ICD-10-CM | POA: Diagnosis not present

## 2024-08-03 DIAGNOSIS — R32 Unspecified urinary incontinence: Secondary | ICD-10-CM | POA: Diagnosis not present

## 2024-08-04 DIAGNOSIS — R2689 Other abnormalities of gait and mobility: Secondary | ICD-10-CM | POA: Diagnosis not present

## 2024-08-04 DIAGNOSIS — W19XXXA Unspecified fall, initial encounter: Secondary | ICD-10-CM | POA: Diagnosis not present

## 2024-08-04 DIAGNOSIS — F03C11 Unspecified dementia, severe, with agitation: Secondary | ICD-10-CM | POA: Diagnosis not present

## 2024-08-04 DIAGNOSIS — M545 Low back pain, unspecified: Secondary | ICD-10-CM | POA: Diagnosis not present

## 2024-08-05 DIAGNOSIS — F03C18 Unspecified dementia, severe, with other behavioral disturbance: Secondary | ICD-10-CM | POA: Diagnosis not present

## 2024-08-11 ENCOUNTER — Emergency Department

## 2024-08-11 ENCOUNTER — Inpatient Hospital Stay
Admission: EM | Admit: 2024-08-11 | Discharge: 2024-08-14 | DRG: 871 | Disposition: A | Discharge status: Hospice | Source: Skilled Nursing Facility | Attending: Family Medicine | Admitting: Family Medicine

## 2024-08-11 ENCOUNTER — Observation Stay

## 2024-08-11 ENCOUNTER — Other Ambulatory Visit: Payer: Self-pay

## 2024-08-11 DIAGNOSIS — S41112A Laceration without foreign body of left upper arm, initial encounter: Secondary | ICD-10-CM | POA: Diagnosis present

## 2024-08-11 DIAGNOSIS — L0390 Cellulitis, unspecified: Secondary | ICD-10-CM | POA: Diagnosis present

## 2024-08-11 DIAGNOSIS — Z515 Encounter for palliative care: Secondary | ICD-10-CM | POA: Diagnosis not present

## 2024-08-11 DIAGNOSIS — E876 Hypokalemia: Secondary | ICD-10-CM | POA: Diagnosis present

## 2024-08-11 DIAGNOSIS — L89152 Pressure ulcer of sacral region, stage 2: Secondary | ICD-10-CM | POA: Diagnosis present

## 2024-08-11 DIAGNOSIS — R531 Weakness: Secondary | ICD-10-CM | POA: Diagnosis not present

## 2024-08-11 DIAGNOSIS — N390 Urinary tract infection, site not specified: Secondary | ICD-10-CM | POA: Diagnosis not present

## 2024-08-11 DIAGNOSIS — Z8249 Family history of ischemic heart disease and other diseases of the circulatory system: Secondary | ICD-10-CM | POA: Diagnosis not present

## 2024-08-11 DIAGNOSIS — E782 Mixed hyperlipidemia: Secondary | ICD-10-CM | POA: Diagnosis not present

## 2024-08-11 DIAGNOSIS — E43 Unspecified severe protein-calorie malnutrition: Secondary | ICD-10-CM | POA: Diagnosis not present

## 2024-08-11 DIAGNOSIS — N3090 Cystitis, unspecified without hematuria: Secondary | ICD-10-CM | POA: Diagnosis present

## 2024-08-11 DIAGNOSIS — E039 Hypothyroidism, unspecified: Secondary | ICD-10-CM | POA: Diagnosis present

## 2024-08-11 DIAGNOSIS — F03C0 Unspecified dementia, severe, without behavioral disturbance, psychotic disturbance, mood disturbance, and anxiety: Secondary | ICD-10-CM | POA: Diagnosis present

## 2024-08-11 DIAGNOSIS — M810 Age-related osteoporosis without current pathological fracture: Secondary | ICD-10-CM | POA: Diagnosis present

## 2024-08-11 DIAGNOSIS — Z7189 Other specified counseling: Secondary | ICD-10-CM | POA: Diagnosis not present

## 2024-08-11 DIAGNOSIS — R627 Adult failure to thrive: Secondary | ICD-10-CM | POA: Diagnosis not present

## 2024-08-11 DIAGNOSIS — Z823 Family history of stroke: Secondary | ICD-10-CM

## 2024-08-11 DIAGNOSIS — Z1152 Encounter for screening for COVID-19: Secondary | ICD-10-CM

## 2024-08-11 DIAGNOSIS — L03114 Cellulitis of left upper limb: Secondary | ICD-10-CM | POA: Diagnosis present

## 2024-08-11 DIAGNOSIS — I959 Hypotension, unspecified: Secondary | ICD-10-CM | POA: Diagnosis present

## 2024-08-11 DIAGNOSIS — R0902 Hypoxemia: Secondary | ICD-10-CM | POA: Diagnosis not present

## 2024-08-11 DIAGNOSIS — E860 Dehydration: Secondary | ICD-10-CM | POA: Diagnosis present

## 2024-08-11 DIAGNOSIS — N179 Acute kidney failure, unspecified: Secondary | ICD-10-CM | POA: Diagnosis present

## 2024-08-11 DIAGNOSIS — W19XXXA Unspecified fall, initial encounter: Secondary | ICD-10-CM | POA: Diagnosis present

## 2024-08-11 DIAGNOSIS — L03119 Cellulitis of unspecified part of limb: Secondary | ICD-10-CM | POA: Diagnosis not present

## 2024-08-11 DIAGNOSIS — Y92129 Unspecified place in nursing home as the place of occurrence of the external cause: Secondary | ICD-10-CM

## 2024-08-11 DIAGNOSIS — I700 Atherosclerosis of aorta: Secondary | ICD-10-CM | POA: Diagnosis not present

## 2024-08-11 DIAGNOSIS — S41111A Laceration without foreign body of right upper arm, initial encounter: Secondary | ICD-10-CM | POA: Diagnosis present

## 2024-08-11 DIAGNOSIS — Z801 Family history of malignant neoplasm of trachea, bronchus and lung: Secondary | ICD-10-CM

## 2024-08-11 DIAGNOSIS — Z7401 Bed confinement status: Secondary | ICD-10-CM | POA: Diagnosis not present

## 2024-08-11 DIAGNOSIS — E162 Hypoglycemia, unspecified: Secondary | ICD-10-CM | POA: Diagnosis present

## 2024-08-11 DIAGNOSIS — Z789 Other specified health status: Secondary | ICD-10-CM | POA: Diagnosis not present

## 2024-08-11 DIAGNOSIS — E872 Acidosis, unspecified: Secondary | ICD-10-CM | POA: Diagnosis present

## 2024-08-11 DIAGNOSIS — Z833 Family history of diabetes mellitus: Secondary | ICD-10-CM | POA: Diagnosis not present

## 2024-08-11 DIAGNOSIS — I6782 Cerebral ischemia: Secondary | ICD-10-CM | POA: Diagnosis not present

## 2024-08-11 DIAGNOSIS — I10 Essential (primary) hypertension: Secondary | ICD-10-CM | POA: Diagnosis not present

## 2024-08-11 DIAGNOSIS — R4182 Altered mental status, unspecified: Secondary | ICD-10-CM | POA: Diagnosis not present

## 2024-08-11 DIAGNOSIS — Z83438 Family history of other disorder of lipoprotein metabolism and other lipidemia: Secondary | ICD-10-CM

## 2024-08-11 DIAGNOSIS — Z66 Do not resuscitate: Secondary | ICD-10-CM | POA: Diagnosis not present

## 2024-08-11 DIAGNOSIS — E559 Vitamin D deficiency, unspecified: Secondary | ICD-10-CM | POA: Diagnosis present

## 2024-08-11 DIAGNOSIS — Z681 Body mass index (BMI) 19 or less, adult: Secondary | ICD-10-CM

## 2024-08-11 DIAGNOSIS — F39 Unspecified mood [affective] disorder: Secondary | ICD-10-CM | POA: Diagnosis not present

## 2024-08-11 DIAGNOSIS — A419 Sepsis, unspecified organism: Secondary | ICD-10-CM | POA: Diagnosis not present

## 2024-08-11 DIAGNOSIS — I771 Stricture of artery: Secondary | ICD-10-CM | POA: Diagnosis not present

## 2024-08-11 DIAGNOSIS — E785 Hyperlipidemia, unspecified: Secondary | ICD-10-CM | POA: Diagnosis present

## 2024-08-11 DIAGNOSIS — R6520 Severe sepsis without septic shock: Secondary | ICD-10-CM | POA: Diagnosis not present

## 2024-08-11 DIAGNOSIS — R410 Disorientation, unspecified: Secondary | ICD-10-CM | POA: Diagnosis not present

## 2024-08-11 DIAGNOSIS — E870 Hyperosmolality and hypernatremia: Secondary | ICD-10-CM | POA: Diagnosis present

## 2024-08-11 DIAGNOSIS — F0390 Unspecified dementia without behavioral disturbance: Secondary | ICD-10-CM | POA: Diagnosis present

## 2024-08-11 DIAGNOSIS — Z79899 Other long term (current) drug therapy: Secondary | ICD-10-CM

## 2024-08-11 LAB — COMPREHENSIVE METABOLIC PANEL WITH GFR
ALT: 25 U/L (ref 0–44)
AST: 39 U/L (ref 15–41)
Albumin: 2.9 g/dL — ABNORMAL LOW (ref 3.5–5.0)
Alkaline Phosphatase: 122 U/L (ref 38–126)
Anion gap: 15 (ref 5–15)
BUN: 93 mg/dL — ABNORMAL HIGH (ref 8–23)
CO2: 21 mmol/L — ABNORMAL LOW (ref 22–32)
Calcium: 9.4 mg/dL (ref 8.9–10.3)
Chloride: 121 mmol/L — ABNORMAL HIGH (ref 98–111)
Creatinine, Ser: 2.59 mg/dL — ABNORMAL HIGH (ref 0.44–1.00)
GFR, Estimated: 16 mL/min — ABNORMAL LOW (ref >60)
Glucose, Bld: 190 mg/dL — ABNORMAL HIGH (ref 70–99)
Potassium: 3.5 mmol/L (ref 3.5–5.1)
Sodium: 157 mmol/L — ABNORMAL HIGH (ref 135–145)
Total Bilirubin: 1 mg/dL (ref 0.0–1.2)
Total Protein: 6.9 g/dL (ref 6.5–8.1)

## 2024-08-11 LAB — CBC WITH DIFFERENTIAL/PLATELET
Abs Immature Granulocytes: 0.08 K/uL — ABNORMAL HIGH (ref 0.00–0.07)
Basophils Absolute: 0 K/uL (ref 0.0–0.1)
Basophils Relative: 0 %
Eosinophils Absolute: 0 K/uL (ref 0.0–0.5)
Eosinophils Relative: 0 %
HCT: 44.7 % (ref 36.0–46.0)
Hemoglobin: 14 g/dL (ref 12.0–15.0)
Immature Granulocytes: 1 %
Lymphocytes Relative: 3 %
Lymphs Abs: 0.4 K/uL — ABNORMAL LOW (ref 0.7–4.0)
MCH: 29.9 pg (ref 26.0–34.0)
MCHC: 31.3 g/dL (ref 30.0–36.0)
MCV: 95.5 fL (ref 80.0–100.0)
Monocytes Absolute: 0.5 K/uL (ref 0.1–1.0)
Monocytes Relative: 3 %
Neutro Abs: 13.7 K/uL — ABNORMAL HIGH (ref 1.7–7.7)
Neutrophils Relative %: 93 %
Platelets: 139 K/uL — ABNORMAL LOW (ref 150–400)
RBC: 4.68 MIL/uL (ref 3.87–5.11)
RDW: 15.1 % (ref 11.5–15.5)
Smear Review: NORMAL
WBC: 14.7 K/uL — ABNORMAL HIGH (ref 4.0–10.5)
nRBC: 0 % (ref 0.0–0.2)

## 2024-08-11 LAB — URINALYSIS, W/ REFLEX TO CULTURE (INFECTION SUSPECTED)
Bilirubin Urine: NEGATIVE
Glucose, UA: NEGATIVE mg/dL
Ketones, ur: NEGATIVE mg/dL
Nitrite: NEGATIVE
Protein, ur: NEGATIVE mg/dL
RBC / HPF: >50 RBC/hpf (ref 0–5)
Specific Gravity, Urine: 1.015 (ref 1.005–1.030)
WBC, UA: >50 WBC/hpf (ref 0–5)
pH: 5 (ref 5.0–8.0)

## 2024-08-11 LAB — RESP PANEL BY RT-PCR (RSV, FLU A&B, COVID)  RVPGX2
Influenza A by PCR: NEGATIVE
Influenza B by PCR: NEGATIVE
Resp Syncytial Virus by PCR: NEGATIVE
SARS Coronavirus 2 by RT PCR: NEGATIVE

## 2024-08-11 LAB — LACTIC ACID, PLASMA
Lactic Acid, Venous: 3.7 mmol/L (ref 0.5–1.9)
Lactic Acid, Venous: 3 mmol/L (ref 0.5–1.9)
Lactic Acid, Venous: 1.8 mmol/L (ref 0.5–1.9)

## 2024-08-11 LAB — TSH: TSH: 11.393 u[IU]/mL — ABNORMAL HIGH (ref 0.350–4.500)

## 2024-08-11 LAB — CBG MONITORING, ED: Glucose-Capillary: 181 mg/dL — ABNORMAL HIGH (ref 70–99)

## 2024-08-11 LAB — CK: Total CK: 434 U/L — ABNORMAL HIGH (ref 38–234)

## 2024-08-11 MED ORDER — LACTATED RINGERS IV BOLUS
1000.0000 mL | Freq: Once | INTRAVENOUS | Status: AC
Start: 2024-08-11 — End: 2024-08-11
  Administered 2024-08-11: 1000 mL via INTRAVENOUS

## 2024-08-11 MED ORDER — SODIUM CHLORIDE 0.9 % IV SOLN
2.0000 g | Freq: Once | INTRAVENOUS | Status: AC
  Administered 2024-08-11: 2 g via INTRAVENOUS
  Filled 2024-08-11: qty 20

## 2024-08-11 MED ORDER — ONDANSETRON HCL 4 MG PO TABS: 4.0000 mg | ORAL_TABLET | Freq: Four times a day (QID) | ORAL | Status: DC | PRN

## 2024-08-11 MED ORDER — DEXTROSE 5 % IV SOLN: INTRAVENOUS | Status: DC

## 2024-08-11 MED ORDER — VANCOMYCIN HCL 750 MG/150ML IV SOLN
750.0000 mg | Freq: Once | INTRAVENOUS | Status: AC
  Administered 2024-08-11: 750 mg via INTRAVENOUS
  Filled 2024-08-11: qty 150

## 2024-08-11 MED ORDER — ACETAMINOPHEN 650 MG RE SUPP: 650.0000 mg | Freq: Four times a day (QID) | RECTAL | Status: DC | PRN

## 2024-08-11 MED ORDER — VANCOMYCIN HCL IN DEXTROSE 1-5 GM/200ML-% IV SOLN
1000.0000 mg | Freq: Once | INTRAVENOUS | Status: DC
Start: 2024-08-11 — End: 2024-08-11

## 2024-08-11 MED ORDER — VANCOMYCIN VARIABLE DOSE PER UNSTABLE RENAL FUNCTION (PHARMACIST DOSING): Status: DC

## 2024-08-11 MED ORDER — ZINC OXIDE 40 % EX OINT
TOPICAL_OINTMENT | Freq: Two times a day (BID) | CUTANEOUS | Status: DC
  Filled 2024-08-11 (×3): qty 113

## 2024-08-11 MED ORDER — ONDANSETRON HCL 4 MG/2ML IJ SOLN: 4.0000 mg | Freq: Four times a day (QID) | INTRAMUSCULAR | Status: DC | PRN

## 2024-08-11 MED ORDER — FENTANYL CITRATE PF 50 MCG/ML IJ SOSY: 12.5000 ug | PREFILLED_SYRINGE | INTRAMUSCULAR | Status: DC | PRN

## 2024-08-11 MED ORDER — CEFTRIAXONE SODIUM 2 G IJ SOLR
2.0000 g | INTRAMUSCULAR | Status: DC
  Administered 2024-08-12 – 2024-08-13 (×2): 2 g via INTRAVENOUS
  Filled 2024-08-11 (×2): qty 20

## 2024-08-11 MED ORDER — LACTATED RINGERS IV BOLUS
1000.0000 mL | Freq: Once | INTRAVENOUS | Status: AC
  Administered 2024-08-11: 1000 mL via INTRAVENOUS

## 2024-08-11 MED ORDER — HEPARIN SODIUM (PORCINE) 5000 UNIT/ML IJ SOLN
5000.0000 [IU] | Freq: Three times a day (TID) | INTRAMUSCULAR | Status: DC
  Administered 2024-08-11 – 2024-08-14 (×9): 5000 [IU] via SUBCUTANEOUS
  Filled 2024-08-11 (×9): qty 1

## 2024-08-11 MED ORDER — ACETAMINOPHEN 325 MG PO TABS: 650.0000 mg | ORAL_TABLET | Freq: Four times a day (QID) | ORAL | Status: DC | PRN

## 2024-08-11 MED ORDER — LACTATED RINGERS IV SOLN: INTRAVENOUS | Status: DC

## 2024-08-11 MED ORDER — OXYCODONE HCL 5 MG PO TABS
5.0000 mg | ORAL_TABLET | ORAL | Status: DC | PRN
  Administered 2024-08-13 – 2024-08-14 (×3): 5 mg via ORAL
  Filled 2024-08-11 (×3): qty 1

## 2024-08-11 NOTE — ED Notes (Signed)
EKG given to Dr. Joni Fears

## 2024-08-11 NOTE — Consult Note (Addendum)
 WOC Nurse Consult Note: Reason for Consult: Consult requested for buttocks and bilat arms.  Sacrum with red-purple linear Deep tissue pressure injury across the top, then it extends to bilt buttocks.  Total affected area is approx 8X6cm.  Pt also has red, moist macerated skin with partial thickness skin loss to bilat buttocks; appearance is consistent with moisture associated skin damage.  Pressure injury was present on admission Left arm with extensive full thickness skin tears; approx 50% red, 50% yellow, mod amt yellow drainage, painful to touch, 15X8X.2cm.  Right arm with less extensive skin tear; partial thickness to upper arm, red -puple and blistering, mod amt tan drainage; 5X8X.2cm Right elbow with dry brown scab, 2X2cm Dressing procedure/placement/frequency: Topical treatment orders provided for bedside nurses to perform as follows: 1. Apply Xeroform gauze to bilat arm skin tears Q day, then cover with ABD pad and ace wrap to hold in place.  Moisten previous Xeroform with NS to assist with removal 2. Desitin to bilat buttocks/sacrum BID and PRN when turning or cleaning.     Please re-consult if further assistance is needed.  Thank-you,  Stephane Fought MSN, RN, CWOCN, CWCN-AP, CNS Contact Mon-Fri 0700-1500: [phone]

## 2024-08-11 NOTE — ED Notes (Signed)
 Bear hugger applied to pt.

## 2024-08-11 NOTE — ED Notes (Signed)
 Patient's BP is improving, currently 99/64

## 2024-08-11 NOTE — ED Triage Notes (Signed)
 Pt arrived to ED via [facility] for an unwitnessed fall that occurred about 20 minutes ago. Pt does have some old and new abrasions on her arms and head. Vitals are WNL with EMS. Not on any blood thinners. Facility states she is more altered than her normal mental baseline. Pt is oriented x1 to person. BGL was 181 on arrival

## 2024-08-11 NOTE — ED Provider Notes (Addendum)
 [facility] Provider Note    Event Date/Time   First MD Initiated Contact with Patient 08/11/24 0448     (approximate)   History   Chief Complaint: Fall   HPI  Rhonda Ingram is a 89 y.o. female with a history of dementia and hypertension who was sent to the ED from her care facility due to unwitnessed fall this morning.  Patient is not able to provide any history.  CODE STATUS is DNR.  Not on blood thinners.  Medication list reviewed.        Past Medical History:  Diagnosis Date  . Arthritis    osteoarthritis  . Hyperlipidemia   . Hypertension   . Osteoporosis   . Vitamin D  deficiency     Current Outpatient Rx  . Order #: [id number] Class: Normal  . Order #: [id number] Class: Normal  . Order #: [id number] Class: Historical Med  . Order #: [id number] Class: Normal  . Order #: [id number] Class: Historical Med    Past Surgical History:  Procedure Laterality Date  . CHOLECYSTECTOMY    . ESOPHAGOGASTRODUODENOSCOPY (EGD) WITH PROPOFOL  N/A 09/23/2020   Procedure: ESOPHAGOGASTRODUODENOSCOPY (EGD) WITH PROPOFOL ;  Surgeon: Therisa Bi, MD;  Location: [facility] ENDOSCOPY;  Service: Gastroenterology;  Laterality: N/A;  . EYE SURGERY     cataract extraction  . FLEXIBLE SIGMOIDOSCOPY N/A 09/23/2020   Procedure: FLEXIBLE SIGMOIDOSCOPY;  Surgeon: Therisa Bi, MD;  Location: [facility] ENDOSCOPY;  Service: Gastroenterology;  Laterality: N/A;  . VENTRAL HERNIA REPAIR N/A 10/08/2016   Procedure: HERNIA REPAIR VENTRAL ADULT;  Surgeon: Louanne KANDICE Muse, MD;  Location: [facility] ORS;  Service: General;  Laterality: N/A;    Physical Exam   Triage Vital Signs: ED Triage Vitals  Encounter Vitals Group     BP      Girls Systolic BP Percentile      Girls Diastolic BP Percentile      Boys Systolic BP Percentile      Boys Diastolic BP Percentile      Pulse      Resp      Temp      Temp src      SpO2      Weight      Height      Head Circumference      Peak Flow       Pain Score      Pain Loc      Pain Education      Exclude from Growth Chart     Most recent vital signs: Vitals:   08/11/24 0515 08/11/24 0517  BP:    Pulse:    Resp:    Temp: (!) 93.8 F (34.3 C) (!) 93.8 F (34.3 C)  SpO2:      General: Awake, chronically ill-appearing and underweight.   CV:  Good peripheral perfusion.  Regular rate rhythm Resp:  Normal effort.  Clear lungs Abd:  No distention.  Soft with suprapubic tenderness Other:  Dry oral mucosa.  Small skin tear on the right upper arm with purulent drainage.  Extensive skin tear on the left forearm with purulent drainage.  No crepitus.  Head atraumatic.  No midline spinal tenderness.   ED Results / Procedures / Treatments   Labs (all labs ordered are listed, but only abnormal results are displayed) Labs Reviewed  COMPREHENSIVE METABOLIC PANEL WITH GFR - Abnormal; Notable for the following components:      Result Value   Sodium 157 (*)    Chloride 121 (*)  CO2 21 (*)    Glucose, Bld 190 (*)    BUN 93 (*)    Creatinine, Ser 2.59 (*)    Albumin 2.9 (*)    GFR, Estimated 16 (*)    All other components within normal limits  CBC WITH DIFFERENTIAL/PLATELET - Abnormal; Notable for the following components:   WBC 14.7 (*)    Platelets 139 (*)    Neutro Abs 13.7 (*)    Lymphs Abs 0.4 (*)    Abs Immature Granulocytes 0.08 (*)    All other components within normal limits  URINALYSIS, W/ REFLEX TO CULTURE (INFECTION SUSPECTED) - Abnormal; Notable for the following components:   Color, Urine YELLOW (*)    APPearance CLOUDY (*)    Hgb urine dipstick SMALL (*)    Leukocytes,Ua LARGE (*)    Bacteria, UA MANY (*)    All other components within normal limits  TSH - Abnormal; Notable for the following components:   TSH 11.393 (*)    All other components within normal limits  CBG MONITORING, ED - Abnormal; Notable for the following components:   Glucose-Capillary 181 (*)    All other components within normal limits   RESP PANEL BY RT-PCR (RSV, FLU A&B, COVID)  [id number]  CULTURE, BLOOD (ROUTINE X 2)  CULTURE, BLOOD (ROUTINE X 2)  URINE CULTURE  LACTIC ACID, PLASMA  LACTIC ACID, PLASMA     EKG Interpreted by me Sinus rhythm rate of 51.  Normal axis, first-degree AV block.  No acute ischemic changes   RADIOLOGY CT head pending   PROCEDURES:  .Critical Care  Performed by: Viviann Pastor, MD Authorized by: Viviann Pastor, MD   Critical care provider statement:    Critical care time (minutes):  35   Critical care time was exclusive of:  Separately billable procedures and treating other patients   Critical care was necessary to treat or prevent imminent or life-threatening deterioration of the following conditions:  Sepsis and renal failure   Critical care was time spent personally by me on the following activities:  Development of treatment plan with patient or surrogate, discussions with consultants, evaluation of patient's response to treatment, examination of patient, obtaining history from patient or surrogate, ordering and performing treatments and interventions, ordering and review of laboratory studies, ordering and review of radiographic studies, pulse oximetry, re-evaluation of patient's condition and review of old charts   Care discussed with: admitting provider      MEDICATIONS ORDERED IN ED: Medications  vancomycin (VANCOREADY) IVPB 750 mg/150 mL (750 mg Intravenous New Bag/Given 08/11/24 0637)  lactated ringers  bolus 1,000 mL (1,000 mLs Intravenous New Bag/Given 08/11/24 0601)  cefTRIAXone (ROCEPHIN) 2 g in sodium chloride  0.9 % 100 mL IVPB (0 g Intravenous Stopped 08/11/24 0626)     IMPRESSION / MDM / ASSESSMENT AND PLAN / ED COURSE  I reviewed the triage vital signs and the nursing notes.  DDx: Dehydration, AKI, electrolyte derangement, anemia, COVID, influenza, UTI, pneumonia, sepsis, intracranial hemorrhage, cellulitis, mechanical fall  Patient's presentation is most  consistent with acute presentation with potential threat to life or bodily function.  Patient with advanced dementia sent to the ED due to a fall.  Does not have any sign of acute injury, but does have old skin tears that appear to be infected.  Initial labs show leukocytosis, AKI with creatinine of 2.6, UTI.  She is hypothermic but normotensive.  Patient is getting IV fluid boluses, initiate vancomycin and Rocephin antibiotics.  Will need to admit after  imaging.   Clinical Course as of 08/11/24 0645  Tue Aug 11, 2024  9355 Chest x-ray interpreted by me, normal.  Radiology report reviewed .  case d/w hospitalist [PS]    Clinical Course User Index [PS] Viviann Pastor, MD     FINAL CLINICAL IMPRESSION(S) / ED DIAGNOSES   Final diagnoses:  Sepsis with acute renal failure without septic shock, due to unspecified organism, unspecified acute renal failure type (HCC)  Cellulitis of left arm  Cystitis  Severe dementia, unspecified dementia type, unspecified whether behavioral, psychotic, or mood disturbance or anxiety (HCC)     Rx / DC Orders   ED Discharge Orders     None        Note:  This document was prepared using Dragon voice recognition software and may include unintentional dictation errors.   Viviann Pastor, MD 08/11/24 SHERIDA    Viviann Pastor, MD 08/11/24 [phone]

## 2024-08-11 NOTE — ED Notes (Signed)
 Skin tears wrapped up in petroleum gauze dressing, non stick dressing, and ace wrap

## 2024-08-11 NOTE — Progress Notes (Signed)
 Pharmacy Antibiotic Note  Rhonda Ingram is a 89 y.o. female admitted on 08/11/2024 with cellulitis.  Pharmacy has been consulted for vancomycin dosing.  Scr 2.59 (BL ~0.6-0.8); WBC 14.7, LA 3.0, afebrile.  Plan: Vancomycin load of 750 mg IV x1 given in ED F/u renal function in AM - consider checking random vancomycin level tomorrow vs scheduling further doses if Scr has improved Continue to monitor renal function, clinical status, culture data, and antibiotic LOT  Weight: 30.3 kg (66 lb 12.8 oz)  Temp (24hrs), Avg:94 F (34.4 C), Min:93.8 F (34.3 C), Max:94.4 F (34.7 C)  Recent Labs  Lab 08/11/24 0504 08/11/24 0740  WBC 14.7*  --   CREATININE 2.59*  --   LATICACIDVEN 3.7* 3.0*    Estimated Creatinine Clearance: 6.1 mL/min (A) (by C-G formula based on SCr of 2.59 mg/dL (H)).    Allergies  Allergen Reactions   Alpha-D-Galactosidase Itching and Shortness Of Breath   Meat Extract Shortness Of Breath   Other     Alpha-gal allergy (meat allergy or Mammalian Meat Allergy) beef/red meat/pork    Antimicrobials this admission: ceftriaxone 9/30 >>  vancomycin 9/30 >>   Dose adjustments this admission: N/A  Microbiology results: 9/30 BCx: pending 9/30 UCx: pending   Thank you for involving pharmacy in this patient's care.   Damien Napoleon, PharmD Clinical Pharmacist 08/11/2024 9:28 AM

## 2024-08-11 NOTE — ED Notes (Signed)
 Bair hugger removed to provide pericare, new brief placed, mepilex applied to sacrum, pt has a stage 2 sacral wound.

## 2024-08-11 NOTE — ED Notes (Signed)
 Dr. Roann informed of patient's current bp of 86/54. 1000ml bolus of LR to be administered

## 2024-08-11 NOTE — H&P (Signed)
 History and Physical    Jearlean Demauro [facility] FMW:[id number] DOB: 06-Mar-1926 DOA: 08/11/2024  DOS: the patient was seen and examined on 08/11/2024  PCP: Valerio Melanie DASEN, NP   Patient coming from: SNF  I have personally briefly reviewed patient's old medical records in [facility]  Chief Complaint: Fall at the nursing home  HPI: Rhonda Ingram is a pleasant 89 y.o. female with medical history significant for dementia, HTN, HLD, osteoporosis who is a nursing home resident brought into ED for an unwitnessed fall yesterday morning.  Patient has a dementia and not able to provide any history.  I tried to call patient's daughter and granddaughter who are listed as emergency contact but no response.  Per nursing home record patient is DNR DNI.  History taken from emergency room record.  ED Course: Upon arrival to the ED, patient is found to be hypothermic at 93.8, dehydrated with sodium level at 157, AKI with creatinine level of 2.59, leukocytosis at 14.7, urine analysis positive for UTI, multiple skin tears on upper extremity and stage II sacral ulcer.  Patient was given ceftriaxone and vancomycin in the ED.  IV fluid per sepsis protocol.  Hospitalist service was consulted for evaluation for admission.  Review of Systems:  ROS  All other systems negative except as noted in the HPI.  Past Medical History:  Diagnosis Date   Arthritis    osteoarthritis   Hyperlipidemia    Hypertension    Osteoporosis    Vitamin D  deficiency     Past Surgical History:  Procedure Laterality Date   CHOLECYSTECTOMY     ESOPHAGOGASTRODUODENOSCOPY (EGD) WITH PROPOFOL  N/A 09/23/2020   Procedure: ESOPHAGOGASTRODUODENOSCOPY (EGD) WITH PROPOFOL ;  Surgeon: Therisa Bi, MD;  Location: [facility] ENDOSCOPY;  Service: Gastroenterology;  Laterality: N/A;   EYE SURGERY     cataract extraction   FLEXIBLE SIGMOIDOSCOPY N/A 09/23/2020   Procedure: FLEXIBLE SIGMOIDOSCOPY;  Surgeon: Therisa Bi, MD;  Location: [facility] ENDOSCOPY;   Service: Gastroenterology;  Laterality: N/A;   VENTRAL HERNIA REPAIR N/A 10/08/2016   Procedure: HERNIA REPAIR VENTRAL ADULT;  Surgeon: Louanne KANDICE Muse, MD;  Location: [facility] ORS;  Service: General;  Laterality: N/A;     reports that she has never smoked. She has never used smokeless tobacco. She reports that she does not drink alcohol and does not use drugs.  Allergies  Allergen Reactions   Alpha-D-Galactosidase Itching and Shortness Of Breath   Meat Extract Shortness Of Breath   Other     Alpha-gal allergy (meat allergy or Mammalian Meat Allergy) beef/red meat/pork    Family History  Problem Relation Age of Onset   Hypertension Mother    Stroke Mother    Cancer Father        lung   Hypertension Sister    Hyperlipidemia Sister    Hypertension Daughter    Cerebral palsy Daughter    Hypertension Maternal Grandmother    Hypertension Maternal Grandfather    Hypertension Paternal Grandmother    Hypertension Paternal Grandfather    Hypertension Sister    Hyperlipidemia Sister    Migraines Daughter    Diabetes Brother    Hypertension Brother    Hyperlipidemia Brother     Prior to Admission medications   Medication Sig Start Date End Date Taking? Authorizing Provider  benazepril  (LOTENSIN ) 5 MG tablet TAKE 1 TABLET (5 MG TOTAL) BY MOUTH DAILY. 04/17/24   [name], Jolene T, NP  ferrous sulfate  324 MG TBEC Take 1 tablet (324 mg total) by mouth  2 (two) times daily. 10/28/23   [name], Jolene T, NP  magnesium oxide (MAG-OX) 400 MG tablet Take 400 mg by mouth at bedtime.    [provider]  mirtazapine  (REMERON  SOL-TAB) 15 MG disintegrating tablet TAKE 0.5 TABLETS BY MOUTH AT BEDTIME. 03/25/24   [name], Jolene T, NP  VITAMIN D  PO Take 2,000 Units by mouth daily.    [provider]    Physical Exam: Vitals:   08/11/24 9161 08/11/24 0855 08/11/24 0915 08/11/24 0930  BP:   (!) 99/55 (!) 80/45  Pulse: 67 62 64 (!) 59  Resp: 17 16 17  (!) 25  Temp: (!) 93.8 F  (34.3 C) (!) 94.4 F (34.7 C) (!) 94.6 F (34.8 C) (!) 95 F (35 C)  TempSrc:      SpO2: 99% 100% 98% 99%  Weight:        Physical Exam   Constitutional: Alert, nonverbal, uncomfortable due to pain HEENT: Neck supple Respiratory: Clear to auscultation B/L, no wheezing, no rales.  Cardiovascular: Regular rate and rhythm, no murmurs / rubs / gallops. No extremity edema. 2+ pedal pulses. No carotid bruits.  Abdomen: Soft, no tenderness, Bowel sounds positive.  Musculoskeletal: no clubbing / cyanosis. Good ROM, no contractures. Normal muscle tone.  Skin: Stage II sacral ulcer, multiple tears on bilateral upper extremities Neurologic: CN 2-12 grossly intact. Sensation intact, No focal deficit identified Psychiatric: Nonverbal  Labs on Admission: I have personally reviewed following labs and imaging studies  CBC: Recent Labs  Lab 08/11/24 0504  WBC 14.7*  NEUTROABS 13.7*  HGB 14.0  HCT 44.7  MCV 95.5  PLT 139*   Basic Metabolic Panel: Recent Labs  Lab 08/11/24 0504  NA 157*  K 3.5  CL 121*  CO2 21*  GLUCOSE 190*  BUN 93*  CREATININE 2.59*  CALCIUM 9.4   GFR: Estimated Creatinine Clearance: 6.1 mL/min (A) (by C-G formula based on SCr of 2.59 mg/dL (H)). Liver Function Tests: Recent Labs  Lab 08/11/24 0504  AST 39  ALT 25  ALKPHOS 122  BILITOT 1.0  PROT 6.9  ALBUMIN 2.9*   No results for input(s): LIPASE, AMYLASE in the last 168 hours. No results for input(s): AMMONIA in the last 168 hours. Coagulation Profile: No results for input(s): INR, PROTIME in the last 168 hours. Cardiac Enzymes: No results for input(s): CKTOTAL, CKMB, CKMBINDEX, TROPONINI, TROPONINIHS in the last 168 hours. BNP (last 3 results) No results for input(s): BNP in the last 8760 hours. HbA1C: No results for input(s): HGBA1C in the last 72 hours. CBG: Recent Labs  Lab 08/11/24 0448  GLUCAP 181*   Lipid Profile: No results for input(s): CHOL, HDL,  LDLCALC, TRIG, CHOLHDL, LDLDIRECT in the last 72 hours. Thyroid  Function Tests: Recent Labs    08/11/24 0504  TSH 11.393*   Anemia Panel: No results for input(s): VITAMINB12, FOLATE, FERRITIN, TIBC, IRON , RETICCTPCT in the last 72 hours. Urine analysis:    Component Value Date/Time   COLORURINE YELLOW (A) 08/11/2024 0505   APPEARANCEUR CLOUDY (A) 08/11/2024 0505   APPEARANCEUR Cloudy (A) 03/02/2024 1518   LABSPEC 1.015 08/11/2024 0505   PHURINE 5.0 08/11/2024 0505   GLUCOSEU NEGATIVE 08/11/2024 0505   HGBUR SMALL (A) 08/11/2024 0505   BILIRUBINUR NEGATIVE 08/11/2024 0505   BILIRUBINUR Negative 03/02/2024 1518   KETONESUR NEGATIVE 08/11/2024 0505   PROTEINUR NEGATIVE 08/11/2024 0505   NITRITE NEGATIVE 08/11/2024 0505   LEUKOCYTESUR LARGE (A) 08/11/2024 0505    Radiological Exams on Admission: I have  personally reviewed images CT Head Wo Contrast Result Date: 08/11/2024 CLINICAL DATA:  Provided history: Mental status change, unknown cause. Additional history provided: Unwitnessed fall. EXAM: CT HEAD WITHOUT CONTRAST TECHNIQUE: Contiguous axial images were obtained from the base of the skull through the vertex without intravenous contrast. RADIATION DOSE REDUCTION: This exam was performed according to the departmental dose-optimization program which includes automated exposure control, adjustment of the mA and/or kV according to patient size and/or use of iterative reconstruction technique. COMPARISON:  Head CT 10/15/2023. FINDINGS: Brain: Advanced generalized cerebral atrophy. Patchy and ill-defined hypoattenuation within the cerebral white matter, nonspecific but compatible with mild chronic small vessel ischemic disease. There is no acute intracranial hemorrhage. No demarcated cortical infarct. No extra-axial fluid collection. No evidence of an intracranial mass. No midline shift. Vascular: No hyperdense vessel.  Atherosclerotic calcifications. Skull: No calvarial  fracture or aggressive osseous lesion. Sinuses/Orbits: No mass or acute finding within the imaged orbits. No significant paranasal sinus disease at the imaged levels. IMPRESSION: 1.  No evidence of an acute intracranial abnormality. 2. Mild chronic small vessel ischemic changes within the cerebral white matter. 3. Advanced generalized cerebral atrophy. Electronically Signed   By: Rockey Childs D.O.   On: 08/11/2024 09:28   DG Chest Portable 1 View Result Date: 08/11/2024 CLINICAL DATA:  89 year old female status post fall. Weakness and altered mental status. EXAM: PORTABLE CHEST 1 VIEW COMPARISON:  Chest radiographs 07/03/2018 and earlier. CT Abdomen and Pelvis 05/06/2023. FINDINGS: Portable AP semi upright view at 0556 hours. Advanced chronic Calcified aortic atherosclerosis., tortuosity of the thoracic aorta is stable since 2019. Other mediastinal contours are within normal limits. Visualized tracheal air column is within normal limits. Large lung volumes. Allowing for portable technique the lungs are clear. No pneumothorax or pleural effusion. Stable cholecystectomy clips. Negative visible bowel gas pattern. No acute osseous abnormality identified. IMPRESSION: 1. No acute cardiopulmonary abnormality or acute traumatic injury identified. 2. Advanced Aortic Atherosclerosis (ICD10-I70.0). Electronically Signed   By: VEAR Hurst M.D.   On: 08/11/2024 06:08    EKG: My personal interpretation of EKG shows: Sinus bradycardia at 51 bpm    Assessment/Plan Principal Problem:   Sepsis secondary to UTI [facility]) Active Problems:   Hypertension   Hyperlipidemia   Dementia without behavioral disturbance (HCC)   Cellulitis   Sacral decubitus ulcer, stage II (HCC)   AKI (acute kidney injury)   Hypernatremia    Assessment and Plan:  This is a 89 year old female with advanced dementia who came who was brought in from nursing home to ED at [facility] for evaluation of a fall.  1.  Fall at nursing home - She will be  admitted to hospital as inpatient - CT head is negative - Chest x-ray is negative for rib fractures - Fall precaution  2.  Sepsis due to UTI/skin tears/sacral decubitus ulcer - Patient received IV fluid per sepsis protocol in ED - Received ceftriaxone and vancomycin - She will be continued on ceftriaxone and vancomycin, vancomycin 2 doses by pharmacy - Will follow the cultures - Patient received 1 L of IV fluid in ED, lactate is still high, blood pressure is low - Will give her 1 L of bolus normal saline - Wound care for sacral decubitus ulcer and skin tear on both upper extremities  3.  Hypernatremia/AKI - It appears that she was dehydrated - Appears to be not eating and drinking looks like it is prerenal - Patient will be given D5 W at 100 cc/h - She was  given 1 L bolus in the ED and second liter has been ordered - Continue to monitor kidney function and sodium level  4.  Advanced dementia - Supportive care - Nursing of record shows patient is not resuscitate per ED - An attempt to speak to emergency contact daughter and granddaughter was unsuccessful as they did not pick up the phone  5.  Hypothyroidism - TSH is 11.393 - She will be started on levothyroxine  6.  History of hypertension - Blood pressure is rather low - Hold off blood pressure medication benazepril     DVT prophylaxis: SQ Heparin Code Status: DNR/DNI(Do NOT Intubate) Family Communication: None, daughter and granddaughter are listed as emergency contact but was not able to speak to them as they did not pick up the phone Disposition Plan: Back to nursing home Consults called: Wound care Admission status: Inpatient, progressive   Nena Rebel, MD [facility] 08/11/2024, 9:40 AM

## 2024-08-11 NOTE — ED Notes (Signed)
 Bair Hugger reapplied to patient.

## 2024-08-12 DIAGNOSIS — E782 Mixed hyperlipidemia: Secondary | ICD-10-CM | POA: Diagnosis not present

## 2024-08-12 DIAGNOSIS — L03119 Cellulitis of unspecified part of limb: Secondary | ICD-10-CM | POA: Diagnosis not present

## 2024-08-12 DIAGNOSIS — I10 Essential (primary) hypertension: Secondary | ICD-10-CM | POA: Diagnosis not present

## 2024-08-12 DIAGNOSIS — A419 Sepsis, unspecified organism: Secondary | ICD-10-CM | POA: Diagnosis not present

## 2024-08-12 DIAGNOSIS — F0390 Unspecified dementia without behavioral disturbance: Secondary | ICD-10-CM

## 2024-08-12 DIAGNOSIS — L89152 Pressure ulcer of sacral region, stage 2: Secondary | ICD-10-CM

## 2024-08-12 DIAGNOSIS — N179 Acute kidney failure, unspecified: Secondary | ICD-10-CM

## 2024-08-12 DIAGNOSIS — E870 Hyperosmolality and hypernatremia: Secondary | ICD-10-CM

## 2024-08-12 LAB — COMPREHENSIVE METABOLIC PANEL WITH GFR
ALT: 21 U/L (ref 0–44)
AST: 38 U/L (ref 15–41)
Albumin: 1.9 g/dL — ABNORMAL LOW (ref 3.5–5.0)
Alkaline Phosphatase: 88 U/L (ref 38–126)
Anion gap: 6 (ref 5–15)
BUN: 73 mg/dL — ABNORMAL HIGH (ref 8–23)
CO2: 22 mmol/L (ref 22–32)
Calcium: 7.8 mg/dL — ABNORMAL LOW (ref 8.9–10.3)
Chloride: 116 mmol/L — ABNORMAL HIGH (ref 98–111)
Creatinine, Ser: 1.82 mg/dL — ABNORMAL HIGH (ref 0.44–1.00)
GFR, Estimated: 25 mL/min — ABNORMAL LOW (ref >60)
Glucose, Bld: 151 mg/dL — ABNORMAL HIGH (ref 70–99)
Potassium: 2.9 mmol/L — ABNORMAL LOW (ref 3.5–5.1)
Sodium: 144 mmol/L (ref 135–145)
Total Bilirubin: 0.6 mg/dL (ref 0.0–1.2)
Total Protein: 4.9 g/dL — ABNORMAL LOW (ref 6.5–8.1)

## 2024-08-12 LAB — URINE CULTURE: Culture: NO GROWTH

## 2024-08-12 LAB — PROTIME-INR
INR: 1.2 (ref 0.8–1.2)
Prothrombin Time: 15.8 s — ABNORMAL HIGH (ref 11.4–15.2)

## 2024-08-12 LAB — CBC
HCT: 34.8 % — ABNORMAL LOW (ref 36.0–46.0)
Hemoglobin: 11.2 g/dL — ABNORMAL LOW (ref 12.0–15.0)
MCH: 30.4 pg (ref 26.0–34.0)
MCHC: 32.2 g/dL (ref 30.0–36.0)
MCV: 94.3 fL (ref 80.0–100.0)
Platelets: 92 K/uL — ABNORMAL LOW (ref 150–400)
RBC: 3.69 MIL/uL — ABNORMAL LOW (ref 3.87–5.11)
RDW: 15.5 % (ref 11.5–15.5)
WBC: 9.3 K/uL (ref 4.0–10.5)
nRBC: 0 % (ref 0.0–0.2)

## 2024-08-12 LAB — VANCOMYCIN, RANDOM: Vancomycin Rm: 10 ug/mL

## 2024-08-12 LAB — T4, FREE: Free T4: 0.97 ng/dL (ref 0.61–1.12)

## 2024-08-12 MED ORDER — POTASSIUM CHLORIDE 10 MEQ/100ML IV SOLN
10.0000 meq | INTRAVENOUS | Status: AC
  Administered 2024-08-12 (×6): 10 meq via INTRAVENOUS
  Filled 2024-08-12 (×6): qty 100

## 2024-08-12 MED ORDER — POTASSIUM CHLORIDE CRYS ER 20 MEQ PO TBCR
40.0000 meq | EXTENDED_RELEASE_TABLET | Freq: Once | ORAL | Status: DC
  Filled 2024-08-12: qty 2

## 2024-08-12 MED ORDER — VANCOMYCIN HCL 750 MG/150ML IV SOLN
750.0000 mg | Freq: Once | INTRAVENOUS | Status: AC
  Administered 2024-08-12: 750 mg via INTRAVENOUS
  Filled 2024-08-12: qty 150

## 2024-08-12 NOTE — Progress Notes (Signed)
 Unable to complete admission questions. Patient not speaking or answering questions.

## 2024-08-12 NOTE — Progress Notes (Signed)
 PROGRESS NOTE    Rhonda Ingram  FMW:[id number] DOB: 02-04-1926 DOA: 08/11/2024 PCP: Valerio Melanie DASEN, NP  Chief Complaint  Patient presents with   [facility] Course:  Rhonda Ingram is a 89 year old female with history of advanced dementia, hypertension, hyperlipidemia, osteoporosis, who is a nursing home resident brought in for an out witnessed fall.  Upon arrival to the ED patient was hypothermic at 93.8, dehydrated with a sodium of 157, AKI with creatinine of 2.59, leukocytosis of 14.7, urinalysis with possible UTI.  She was also found to have multiple skin tears on her upper extremities and a stage II sacral ulcer.  She was given ceftriaxone and vancomycin.  She was started on IV fluids.  She was admitted for further workup.  Subjective: I have tried to reach the patient's daughter and granddaughter on 5 separate occasions today.  The patient's grandson did come to bedside and we were able to speak but he is not the POA.  I did update him on to the patient's current health condition.  He reported he would let his mom [facility]) know that I was going to call.  I then called Etta again still no answer.  The patient is completely nonverbal to me.  She does not participate in exam or follow commands.  She stares blankly.  Bedside RN reports her exam has been the same for him throughout the day.  Objective: Vitals:   08/11/24 2108 08/11/24 2328 08/12/24 0827 08/12/24 1254  BP: 122/60 116/71 (!) 139/92 (!) 148/80  Pulse: (!) 109 71 (!) 103 (!) 58  Resp: 15 17    Temp: (!) 97.3 F (36.3 C) (!) 96.3 F (35.7 C) 97.8 F (36.6 C) 97.9 F (36.6 C)  TempSrc:      SpO2: 98% 100% (!) 85% 100%  Weight:        Intake/Output Summary (Last 24 hours) at 08/12/2024 1603 Last data filed at 08/12/2024 0900 Gross per 24 hour  Intake 240 ml  Output 300 ml  Net -60 ml   Filed Weights   08/11/24 0505  Weight: 30.3 kg    Examination: General exam: Appears calm and  comfortable, NAD  Respiratory system: No work of breathing, symmetric chest wall expansion Cardiovascular system: S1 & S2 heard, RRR.  Gastrointestinal system: Abdomen is nondistended, soft and nontender.  Neuro: stares blankly, nonverbal.  Does not follow commands or answer questions.  Assessment & Plan:  Principal Problem:   Sepsis secondary to UTI [facility]) Active Problems:   Hypertension   Hyperlipidemia   Dementia without behavioral disturbance (HCC)   Cellulitis   Sacral decubitus ulcer, stage II (HCC)   AKI (acute kidney injury)   Hypernatremia   Sepsis (HCC)   Fall at nursing home - CT head negative --CXR negative for rib fractures - Fall precautions - Delirium precautions - Family unaware of fall, cannot provide any history.  Patient from [place] [facility], not answering the phone.  Sepsis due to UTI versus skin tears versus sacral decubitus ulcer - Sepsis criteria: Hypothermia, leukocytosis, positive UA, cellulitis. - Status post IV fluids per sepsis protocol - Initially started on ceftriaxone and vancomycin.  Will discontinue vancomycin. - Follow cultures - Lactic acidosis has resolved - Hypothermia has resolved - Wound care for sacral decub and skin tear  Hypokalemia - Patient is not tolerating p.o. - Replace IV - Repeat in AM.  Hypernatremia AKI - Appears secondary to dehydration - Has been on IV fluids with some  improvement - Suspect this is chronic given her advanced dementia - Will continue with IV fluids for now monitor volume status closely  Advanced dementia - Suspected end stage - Have been unable to reach the patient's POA today despite multiple attempts. - Per great great grandson at baseline patient knows her daughter and grandson by name.  She is completely nonverbal and not on commands for us  - Patient is an excellent hospice candidate - Will consult palliative care once POA has been reached.  Hypothyroidism - TSH 11.3, T4 normal - Has been  initiated on levothyroxine  History of hypertension - Hold off blood pressure medications given hypotension on arrival  DVT prophylaxis: Heparin   Code Status: Limited: Do not attempt resuscitation (DNR) -DNR-LIMITED -Do Not Intubate/DNI  Disposition:  Inpatient, pending clinical resolution  Consultants:    Procedures:    Antimicrobials:  Anti-infectives (From admission, onward)    Start     Dose/Rate Route Frequency Ordered Stop   08/12/24 1300  vancomycin (VANCOREADY) IVPB 750 mg/150 mL        750 mg 150 mL/hr over 60 Minutes Intravenous  Once 08/12/24 1203 08/12/24 1520   08/12/24 0600  cefTRIAXone (ROCEPHIN) 2 g in sodium chloride  0.9 % 100 mL IVPB        2 g 200 mL/hr over 30 Minutes Intravenous Every 24 hours 08/11/24 0924     08/11/24 0933  vancomycin variable dose per unstable renal function (pharmacist dosing)         Does not apply See admin instructions 08/11/24 0933     08/11/24 0600  cefTRIAXone (ROCEPHIN) 2 g in sodium chloride  0.9 % 100 mL IVPB        2 g 200 mL/hr over 30 Minutes Intravenous Once 08/11/24 0553 08/11/24 0626   08/11/24 0600  vancomycin (VANCOCIN) IVPB 1000 mg/200 mL premix  Status:  Discontinued        1,000 mg 200 mL/hr over 60 Minutes Intravenous  Once 08/11/24 0553 08/11/24 0556   08/11/24 0600  vancomycin (VANCOREADY) IVPB 750 mg/150 mL        750 mg 150 mL/hr over 60 Minutes Intravenous  Once 08/11/24 0556 08/11/24 0739       Data Reviewed: I have personally reviewed following labs and imaging studies CBC: Recent Labs  Lab 08/11/24 0504 08/12/24 0356  WBC 14.7* 9.3  NEUTROABS 13.7*  --   HGB 14.0 11.2*  HCT 44.7 34.8*  MCV 95.5 94.3  PLT 139* 92*   Basic Metabolic Panel: Recent Labs  Lab 08/11/24 0504 08/12/24 0356  NA 157* 144  K 3.5 2.9*  CL 121* 116*  CO2 21* 22  GLUCOSE 190* 151*  BUN 93* 73*  CREATININE 2.59* 1.82*  CALCIUM 9.4 7.8*   GFR: Estimated Creatinine Clearance: 8.6 mL/min (A) (by C-G formula based  on SCr of 1.82 mg/dL (H)). Liver Function Tests: Recent Labs  Lab 08/11/24 0504 08/12/24 0356  AST 39 38  ALT 25 21  ALKPHOS 122 88  BILITOT 1.0 0.6  PROT 6.9 4.9*  ALBUMIN 2.9* 1.9*   CBG: Recent Labs  Lab 08/11/24 0448  GLUCAP 181*    Recent Results (from the past 240 hours)  Resp panel by RT-PCR (RSV, Flu A&B, Covid) Anterior Nasal Swab     Status: None   Collection Time: 08/11/24  5:04 AM   Specimen: Anterior Nasal Swab  Result Value Ref Range Status   SARS Coronavirus 2 by RT PCR NEGATIVE NEGATIVE Final  Comment: (NOTE) SARS-CoV-2 target nucleic acids are NOT DETECTED.  The SARS-CoV-2 RNA is generally detectable in upper respiratory specimens during the acute phase of infection. The lowest concentration of SARS-CoV-2 viral copies this assay can detect is 138 copies/mL. A negative result does not preclude SARS-Cov-2 infection and should not be used as the sole basis for treatment or other patient management decisions. A negative result may occur with  improper specimen collection/handling, submission of specimen other than nasopharyngeal swab, presence of viral mutation(s) within the areas targeted by this assay, and inadequate number of viral copies(<138 copies/mL). A negative result must be combined with clinical observations, patient history, and epidemiological information. The expected result is Negative.  Fact Sheet for Patients:  BloggerCourse.com  Fact Sheet for Healthcare Providers:  SeriousBroker.it  This test is no t yet approved or cleared by the [place]  FDA and  has been authorized for detection and/or diagnosis of SARS-CoV-2 by FDA under an Emergency Use Authorization (EUA). This EUA will remain  in effect (meaning this test can be used) for the duration of the COVID-19 declaration under Section 564(b)(1) of the Act, 21 U.S.C.section 360bbb-3(b)(1), unless the authorization is terminated   or revoked sooner.       Influenza A by PCR NEGATIVE NEGATIVE Final   Influenza B by PCR NEGATIVE NEGATIVE Final    Comment: (NOTE) The Xpert Xpress SARS-CoV-2/FLU/RSV plus assay is intended as an aid in the diagnosis of influenza from Nasopharyngeal swab specimens and should not be used as a sole basis for treatment. Nasal washings and aspirates are unacceptable for Xpert Xpress SARS-CoV-2/FLU/RSV testing.  Fact Sheet for Patients: BloggerCourse.com  Fact Sheet for Healthcare Providers: SeriousBroker.it  This test is not yet approved or cleared by the [place]  FDA and has been authorized for detection and/or diagnosis of SARS-CoV-2 by FDA under an Emergency Use Authorization (EUA). This EUA will remain in effect (meaning this test can be used) for the duration of the COVID-19 declaration under Section 564(b)(1) of the Act, 21 U.S.C. section 360bbb-3(b)(1), unless the authorization is terminated or revoked.     Resp Syncytial Virus by PCR NEGATIVE NEGATIVE Final    Comment: (NOTE) Fact Sheet for Patients: BloggerCourse.com  Fact Sheet for Healthcare Providers: SeriousBroker.it  This test is not yet approved or cleared by the [place]  FDA and has been authorized for detection and/or diagnosis of SARS-CoV-2 by FDA under an Emergency Use Authorization (EUA). This EUA will remain in effect (meaning this test can be used) for the duration of the COVID-19 declaration under Section 564(b)(1) of the Act, 21 U.S.C. section 360bbb-3(b)(1), unless the authorization is terminated or revoked.  Performed at [facility], [address], [place], [place] [address]   Urine Culture     Status: None   Collection Time: 08/11/24  5:05 AM   Specimen: Urine, Random  Result Value Ref Range Status   Specimen Description   Final    URINE, RANDOM Performed at [facility], [address] [place], [place] [address]    Special Requests   Final    NONE Reflexed from [id number] Performed at [facility], [address] [place], [place] [address]    Culture   Final    NO GROWTH Performed at [facility], [address], [place], [place] [address]    Report Status 08/12/2024 FINAL  Final  Blood Culture (routine x 2)     Status: None (Preliminary result)   Collection Time: 08/11/24  5:45 AM   Specimen: BLOOD  Result Value Ref Range Status   Specimen Description BLOOD RIGHT FOREARM  Final   Special Requests   Final    BOTTLES DRAWN AEROBIC AND ANAEROBIC Blood Culture results may not be optimal due to an inadequate volume of blood received in culture bottles   Culture   Final    NO GROWTH 1 DAY Performed at [facility], [address], [place], [place] [address]    Report Status PENDING  Incomplete  Blood Culture (routine x 2)     Status: None (Preliminary result)   Collection Time: 08/11/24  6:39 AM   Specimen: BLOOD  Result Value Ref Range Status   Specimen Description BLOOD LEFT ANTECUBITAL  Final   Special Requests   Final    BOTTLES DRAWN AEROBIC AND ANAEROBIC Blood Culture results may not be optimal due to an inadequate volume of blood received in culture bottles   Culture   Final    NO GROWTH 1 DAY Performed at [facility], [address] [place], [place] [address]    Report Status PENDING  Incomplete     Radiology Studies: CT Head Wo Contrast Result Date: 08/11/2024 CLINICAL DATA:  Provided history: Mental status change, unknown cause. Additional history provided: Unwitnessed fall. EXAM: CT HEAD WITHOUT CONTRAST TECHNIQUE: Contiguous axial images were obtained from the base of the skull through the vertex without intravenous contrast. RADIATION DOSE REDUCTION: This exam was performed according to the departmental dose-optimization program which includes automated exposure control, adjustment of  the mA and/or kV according to patient size and/or use of iterative reconstruction technique. COMPARISON:  Head CT 10/15/2023. FINDINGS: Brain: Advanced generalized cerebral atrophy. Patchy and ill-defined hypoattenuation within the cerebral white matter, nonspecific but compatible with mild chronic small vessel ischemic disease. There is no acute intracranial hemorrhage. No demarcated cortical infarct. No extra-axial fluid collection. No evidence of an intracranial mass. No midline shift. Vascular: No hyperdense vessel.  Atherosclerotic calcifications. Skull: No calvarial fracture or aggressive osseous lesion. Sinuses/Orbits: No mass or acute finding within the imaged orbits. No significant paranasal sinus disease at the imaged levels. IMPRESSION: 1.  No evidence of an acute intracranial abnormality. 2. Mild chronic small vessel ischemic changes within the cerebral white matter. 3. Advanced generalized cerebral atrophy. Electronically Signed   By: Rockey Childs D.O.   On: 08/11/2024 09:28   DG Chest Portable 1 View Result Date: 08/11/2024 CLINICAL DATA:  89 year old female status post fall. Weakness and altered mental status. EXAM: PORTABLE CHEST 1 VIEW COMPARISON:  Chest radiographs 07/03/2018 and earlier. CT Abdomen and Pelvis 05/06/2023. FINDINGS: Portable AP semi upright view at 0556 hours. Advanced chronic Calcified aortic atherosclerosis., tortuosity of the thoracic aorta is stable since 2019. Other mediastinal contours are within normal limits. Visualized tracheal air column is within normal limits. Large lung volumes. Allowing for portable technique the lungs are clear. No pneumothorax or pleural effusion. Stable cholecystectomy clips. Negative visible bowel gas pattern. No acute osseous abnormality identified. IMPRESSION: 1. No acute cardiopulmonary abnormality or acute traumatic injury identified. 2. Advanced Aortic Atherosclerosis (ICD10-I70.0). Electronically Signed   By: VEAR Hurst M.D.   On: 08/11/2024  06:08    Scheduled Meds:  heparin  5,000 Units Subcutaneous Q8H   liver oil-zinc oxide   Topical BID   vancomycin variable dose per unstable renal function (pharmacist dosing)   Does not apply See admin instructions   Continuous Infusions:  cefTRIAXone (ROCEPHIN)  IV 2 g (08/12/24 0605)  potassium chloride  10 mEq (08/12/24 1537)     LOS: 1 day  MDM: Patient is high risk for one or more organ failure.  They necessitate ongoing hospitalization for continued IV therapies and subsequent lab monitoring. Total time spent interpreting labs and vitals, reviewing the medical record, coordinating care amongst consultants and care team members, directly assessing and discussing care with the patient and/or family: 55 min  Brandyn Lowrey, DO [facility]  To contact the attending physician between 7A-7P please use Epic Chat. To contact the covering physician during after hours 7P-7A, please review Amion.  08/12/2024, 4:03 PM   *This document has been created with the assistance of dictation software. Please excuse typographical errors. *

## 2024-08-13 DIAGNOSIS — E782 Mixed hyperlipidemia: Secondary | ICD-10-CM | POA: Diagnosis not present

## 2024-08-13 DIAGNOSIS — I10 Essential (primary) hypertension: Secondary | ICD-10-CM | POA: Diagnosis not present

## 2024-08-13 DIAGNOSIS — L03119 Cellulitis of unspecified part of limb: Secondary | ICD-10-CM | POA: Diagnosis not present

## 2024-08-13 DIAGNOSIS — A419 Sepsis, unspecified organism: Secondary | ICD-10-CM | POA: Diagnosis not present

## 2024-08-13 LAB — CBC WITH DIFFERENTIAL/PLATELET
Abs Immature Granulocytes: 0.06 K/uL (ref 0.00–0.07)
Basophils Absolute: 0 K/uL (ref 0.0–0.1)
Basophils Relative: 0 %
Eosinophils Absolute: 0.1 K/uL (ref 0.0–0.5)
Eosinophils Relative: 1 %
HCT: 36.7 % (ref 36.0–46.0)
Hemoglobin: 11.7 g/dL — ABNORMAL LOW (ref 12.0–15.0)
Immature Granulocytes: 1 %
Lymphocytes Relative: 6 %
Lymphs Abs: 0.5 K/uL — ABNORMAL LOW (ref 0.7–4.0)
MCH: 29.6 pg (ref 26.0–34.0)
MCHC: 31.9 g/dL (ref 30.0–36.0)
MCV: 92.9 fL (ref 80.0–100.0)
Monocytes Absolute: 0.3 K/uL (ref 0.1–1.0)
Monocytes Relative: 4 %
Neutro Abs: 7.6 K/uL (ref 1.7–7.7)
Neutrophils Relative %: 88 %
Platelets: 111 K/uL — ABNORMAL LOW (ref 150–400)
RBC: 3.95 MIL/uL (ref 3.87–5.11)
RDW: 15.5 % (ref 11.5–15.5)
Smear Review: NORMAL
WBC: 8.6 K/uL (ref 4.0–10.5)
nRBC: 0 % (ref 0.0–0.2)

## 2024-08-13 LAB — COMPREHENSIVE METABOLIC PANEL WITH GFR
ALT: 23 U/L (ref 0–44)
AST: 31 U/L (ref 15–41)
Albumin: 1.8 g/dL — ABNORMAL LOW (ref 3.5–5.0)
Alkaline Phosphatase: 88 U/L (ref 38–126)
Anion gap: 9 (ref 5–15)
BUN: 48 mg/dL — ABNORMAL HIGH (ref 8–23)
CO2: 22 mmol/L (ref 22–32)
Calcium: 8.3 mg/dL — ABNORMAL LOW (ref 8.9–10.3)
Chloride: 114 mmol/L — ABNORMAL HIGH (ref 98–111)
Creatinine, Ser: 1.37 mg/dL — ABNORMAL HIGH (ref 0.44–1.00)
GFR, Estimated: 35 mL/min — ABNORMAL LOW (ref >60)
Glucose, Bld: 75 mg/dL (ref 70–99)
Potassium: 3.6 mmol/L (ref 3.5–5.1)
Sodium: 145 mmol/L (ref 135–145)
Total Bilirubin: 0.5 mg/dL (ref 0.0–1.2)
Total Protein: 4.8 g/dL — ABNORMAL LOW (ref 6.5–8.1)

## 2024-08-13 MED ORDER — CEPHALEXIN 250 MG PO CAPS
250.0000 mg | ORAL_CAPSULE | Freq: Two times a day (BID) | ORAL | Status: DC
  Administered 2024-08-14: 250 mg via ORAL
  Filled 2024-08-13: qty 1

## 2024-08-13 MED ORDER — LACTATED RINGERS IV SOLN: INTRAVENOUS | Status: DC

## 2024-08-13 NOTE — TOC Progression Note (Signed)
 Transition of Care [facility]) - Progression Note    Patient Details  Name: Rhonda Ingram MRN: [id number] Date of Birth: 03-12-1926  Transition of Care [facility]) CM/SW Contact  [name], LCSW Phone Number: 08/13/2024, 3:32 PM  Clinical Narrative:     KEN DELENA Rummer met with pt, grandson and granddaughter in room 234. Family request further assistance with Hospice. [facility] informed of family request and will meet in room 234.           Expected Discharge Plan and Services                                               Social Drivers of Health (SDOH) Interventions SDOH Screenings   Food Insecurity: No Food Insecurity (08/13/2024)  Housing: Low Risk  (08/13/2024)  Transportation Needs: No Transportation Needs (08/13/2024)  Utilities: Not At Risk (08/13/2024)  Alcohol Screen: Low Risk  (04/10/2023)  Depression (PHQ2-9): High Risk (03/02/2024)  Financial Resource Strain: Low Risk  (04/10/2023)  Physical Activity: Insufficiently Active (04/10/2023)  Social Connections: Socially Isolated (08/13/2024)  Stress: No Stress Concern Present (04/10/2023)  Tobacco Use: Low Risk  (08/11/2024)    Readmission Risk Interventions     No data to display

## 2024-08-13 NOTE — Plan of Care (Signed)
  Problem: Clinical Measurements: Goal: Diagnostic test results will improve Outcome: Progressing Goal: Respiratory complications will improve Outcome: Progressing   Problem: Elimination: Goal: Will not experience complications related to bowel motility Outcome: Progressing

## 2024-08-13 NOTE — Care Management Important Message (Signed)
 Important Message  Patient Details  Name: Rhonda Ingram MRN: [id number] Date of Birth: 03-20-1926   Important Message Given:  Yes - Medicare IM     Rojelio [name] 08/13/2024, 4:36 PM

## 2024-08-13 NOTE — Progress Notes (Signed)
 Bilateral arm wound dressings changed. Patient appears to be in pain and repetitively verbalizing hurt. PAINID score of 5. Oxy 5mg  given.

## 2024-08-13 NOTE — Plan of Care (Signed)

## 2024-08-13 NOTE — Progress Notes (Signed)
 PROGRESS NOTE    Rhonda Ingram  FMW:[id number] DOB: November 14, 1925 DOA: 08/11/2024 PCP: Valerio Melanie DASEN, NP  Chief Complaint  Patient presents with   [facility]:  Rhonda Ingram is a 89 year old female with history of advanced dementia, hypertension, hyperlipidemia, osteoporosis, who is a nursing home resident brought in for an out witnessed fall.  Upon arrival to the ED patient was hypothermic at 93.8, dehydrated with a sodium of 157, AKI with creatinine of 2.59, leukocytosis of 14.7, urinalysis with possible UTI.  She was also found to have multiple skin tears on her upper extremities and a stage II sacral ulcer.  She was given ceftriaxone and vancomycin.  She was started on IV fluids.  She was admitted for further workup.  Subjective: Patient is more alert today.  She remains completely disoriented.  She was seen during dressing changes and is in distress and appears to be experiencing pain with ambulation of her extremities.  Patient's granddaughter Etta and [place] later came to bedside.  We discussed overall prognosis.  We discussed that Ms. Yan has very poor oral intake, multiple severe skin tears, decubitus ulcer, and possible urinary infection.  We discussed that in light of all of these issues and her very poor nutritional reserve she is likely to die within next 6 months.  I advised hospice.  Patient's granddaughter, Etta, is understanding.  She reports that she has a niece that works for hospice and is familiar with the goals of making the patient comfortable.  She reports that she needs to discuss the final plan with her mother, the patient's daughter.  She reports they have never considered hospice before, as they hoped that the patient would pass away peacefully in her sleep   Objective: Vitals:   08/13/24 0133 08/13/24 0451 08/13/24 0810 08/13/24 1226  BP: 121/66 120/76 117/77 (!) 152/103  Pulse: 73 75 72 76  Resp:  18    Temp: (!) 97 F (36.1  C) (!) 97.2 F (36.2 C) 97.6 F (36.4 C) 97.6 F (36.4 C)  TempSrc:  Oral    SpO2:  100% 100% 100%  Weight:        Intake/Output Summary (Last 24 hours) at 08/13/2024 1425 Last data filed at 08/13/2024 0400 Gross per 24 hour  Intake 400 ml  Output 550 ml  Net -150 ml   Filed Weights   08/11/24 0505  Weight: 30.3 kg    Examination: General exam: Appears calm and comfortable, NAD.  Thin, frail Respiratory system: No work of breathing, symmetric chest wall expansion Cardiovascular system: S1 & S2 heard, RRR.  Gastrointestinal system: Abdomen is nondistended, soft and nontender.  Neuro: Alert, agitated.  Speech is coherent but nonsensical.  Does not follow commands or answer questions consistently Skin: Thin skin, bruises in various stages of healing.  Severe skin sloughing left arm.  See photo below.  No surrounding erythema at this time, no active purulence, some granulation tissue appreciated at wound bed    Assessment & Plan:  Principal Problem:   Sepsis secondary to UTI [facility]) Active Problems:   Hypertension   Hyperlipidemia   Dementia without behavioral disturbance (HCC)   Cellulitis   Sacral decubitus ulcer, stage II (HCC)   AKI (acute kidney injury)   Hypernatremia   Sepsis (HCC)    Advanced dementia BMI 13 Severe protein calorie malnutrition - End-stage dementia.  Complicated by severe protein calorie malnutrition, kidney injury provoked by dehydration, poor nutritional intake. - Discussed care  with patient's POA today.  Family is considering hospice.  We await their final decision at this time.  Overall patient's prognosis is poor. - Patient will require hospice at a facility, currently resides at [facility] house which family would like her to go back to.  I have involved [facility] and [facility] hospice to help with these arrangements if family so desires - Palliative care consulted for assistance in GOC conversations - Patient continues to require one-to-one  assistance for feeding.  Has had minimal intake.  Fall at nursing home - CT head negative --CXR negative for rib fractures - Fall precautions - Delirium precautions  Sepsis due to UTI versus skin tears versus sacral decubitus ulcer - Sepsis criteria: Hypothermia, leukocytosis, positive UA, cellulitis. - Status post IV fluids per sepsis protocol - Initially started on ceftriaxone and vancomycin.  No true indication for vancomycin which has been discontinued. - Patient is now status post 3 days of ceftriaxone.  Will de-escalate to Keflex for continued skin coverage given severe skin tears and high risk for superimposed bacterial infection - Blood and urine cultures remain negative - Lactic acidosis has resolved - Hypothermia has resolved - Wound care for sacral decub and skin tear  Hypokalemia, resolved - cont to replace as needed  Hypernatremia AKI - Appears secondary to dehydration, provoke by poor intake - Has been on IV fluids with some improvement - Will continue with IV fluids for now monitor volume status closely  Hypothyroidism - TSH 11.3, T4 normal - Attempting to initiate levothyroxine but patient has reported Alpha-d-galactosidase allergy. Pharmacy to review  History of hypertension - Hold off blood pressure medications given hypotension on arrival  DVT prophylaxis: Heparin   Code Status: Limited: Do not attempt resuscitation (DNR) -DNR-LIMITED -Do Not Intubate/DNI  Disposition:  Inpatient, pending clinical resolution vs hospice  Consultants:    Procedures:    Antimicrobials:  Anti-infectives (From admission, onward)    Start     Dose/Rate Route Frequency Ordered Stop   08/14/24 1000  cephALEXin (KEFLEX) capsule 250 mg        250 mg Oral Every 12 hours 08/13/24 1420     08/12/24 1300  vancomycin (VANCOREADY) IVPB 750 mg/150 mL        750 mg 150 mL/hr over 60 Minutes Intravenous  Once 08/12/24 1203 08/13/24 0814   08/12/24 0600  cefTRIAXone (ROCEPHIN) 2 g  in sodium chloride  0.9 % 100 mL IVPB  Status:  Discontinued        2 g 200 mL/hr over 30 Minutes Intravenous Every 24 hours 08/11/24 0924 08/13/24 1420   08/11/24 0933  vancomycin variable dose per unstable renal function (pharmacist dosing)  Status:  Discontinued         Does not apply See admin instructions 08/11/24 0933 08/13/24 0814   08/11/24 0600  cefTRIAXone (ROCEPHIN) 2 g in sodium chloride  0.9 % 100 mL IVPB        2 g 200 mL/hr over 30 Minutes Intravenous Once 08/11/24 0553 08/11/24 0626   08/11/24 0600  vancomycin (VANCOCIN) IVPB 1000 mg/200 mL premix  Status:  Discontinued        1,000 mg 200 mL/hr over 60 Minutes Intravenous  Once 08/11/24 0553 08/11/24 0556   08/11/24 0600  vancomycin (VANCOREADY) IVPB 750 mg/150 mL        750 mg 150 mL/hr over 60 Minutes Intravenous  Once 08/11/24 0556 08/11/24 0739       Data Reviewed: I have personally reviewed following labs and imaging  studies CBC: Recent Labs  Lab 08/11/24 0504 08/12/24 0356 08/13/24 0835  WBC 14.7* 9.3 8.6  NEUTROABS 13.7*  --  7.6  HGB 14.0 11.2* 11.7*  HCT 44.7 34.8* 36.7  MCV 95.5 94.3 92.9  PLT 139* 92* 111*   Basic Metabolic Panel: Recent Labs  Lab 08/11/24 0504 08/12/24 0356 08/13/24 0835  NA 157* 144 145  K 3.5 2.9* 3.6  CL 121* 116* 114*  CO2 21* 22 22  GLUCOSE 190* 151* 75  BUN 93* 73* 48*  CREATININE 2.59* 1.82* 1.37*  CALCIUM 9.4 7.8* 8.3*   GFR: Estimated Creatinine Clearance: 11.5 mL/min (A) (by C-G formula based on SCr of 1.37 mg/dL (H)). Liver Function Tests: Recent Labs  Lab 08/11/24 0504 08/12/24 0356 08/13/24 0835  AST 39 38 31  ALT 25 21 23   ALKPHOS 122 88 88  BILITOT 1.0 0.6 0.5  PROT 6.9 4.9* 4.8*  ALBUMIN 2.9* 1.9* 1.8*   CBG: Recent Labs  Lab 08/11/24 0448  GLUCAP 181*    Recent Results (from the past 240 hours)  Resp panel by RT-PCR (RSV, Flu A&B, Covid) Anterior Nasal Swab     Status: None   Collection Time: 08/11/24  5:04 AM   Specimen: Anterior  Nasal Swab  Result Value Ref Range Status   SARS Coronavirus 2 by RT PCR NEGATIVE NEGATIVE Final    Comment: (NOTE) SARS-CoV-2 target nucleic acids are NOT DETECTED.  The SARS-CoV-2 RNA is generally detectable in upper respiratory specimens during the acute phase of infection. The lowest concentration of SARS-CoV-2 viral copies this assay can detect is 138 copies/mL. A negative result does not preclude SARS-Cov-2 infection and should not be used as the sole basis for treatment or other patient management decisions. A negative result may occur with  improper specimen collection/handling, submission of specimen other than nasopharyngeal swab, presence of viral mutation(s) within the areas targeted by this assay, and inadequate number of viral copies(<138 copies/mL). A negative result must be combined with clinical observations, patient history, and epidemiological information. The expected result is Negative.  Fact Sheet for Patients:  BloggerCourse.com  Fact Sheet for Healthcare Providers:  SeriousBroker.it  This test is no t yet approved or cleared by the [place]  FDA and  has been authorized for detection and/or diagnosis of SARS-CoV-2 by FDA under an Emergency Use Authorization (EUA). This EUA will remain  in effect (meaning this test can be used) for the duration of the COVID-19 declaration under Section 564(b)(1) of the Act, 21 U.S.C.section 360bbb-3(b)(1), unless the authorization is terminated  or revoked sooner.       Influenza A by PCR NEGATIVE NEGATIVE Final   Influenza B by PCR NEGATIVE NEGATIVE Final    Comment: (NOTE) The Xpert Xpress SARS-CoV-2/FLU/RSV plus assay is intended as an aid in the diagnosis of influenza from Nasopharyngeal swab specimens and should not be used as a sole basis for treatment. Nasal washings and aspirates are unacceptable for Xpert Xpress SARS-CoV-2/FLU/RSV testing.  Fact Sheet for  Patients: BloggerCourse.com  Fact Sheet for Healthcare Providers: SeriousBroker.it  This test is not yet approved or cleared by the [place]  FDA and has been authorized for detection and/or diagnosis of SARS-CoV-2 by FDA under an Emergency Use Authorization (EUA). This EUA will remain in effect (meaning this test can be used) for the duration of the COVID-19 declaration under Section 564(b)(1) of the Act, 21 U.S.C. section 360bbb-3(b)(1), unless the authorization is terminated or revoked.     Resp Syncytial Virus by  PCR NEGATIVE NEGATIVE Final    Comment: (NOTE) Fact Sheet for Patients: BloggerCourse.com  Fact Sheet for Healthcare Providers: SeriousBroker.it  This test is not yet approved or cleared by the [place]  FDA and has been authorized for detection and/or diagnosis of SARS-CoV-2 by FDA under an Emergency Use Authorization (EUA). This EUA will remain in effect (meaning this test can be used) for the duration of the COVID-19 declaration under Section 564(b)(1) of the Act, 21 U.S.C. section 360bbb-3(b)(1), unless the authorization is terminated or revoked.  Performed at [facility], [address], [place], [place] [address]   Urine Culture     Status: None   Collection Time: 08/11/24  5:05 AM   Specimen: Urine, Random  Result Value Ref Range Status   Specimen Description   Final    URINE, RANDOM Performed at [facility], [address], [place], [place] [address]    Special Requests   Final    NONE Reflexed from [phone] [id number] Performed at [facility], [address] [place], [place] [address]    Culture   Final    NO GROWTH Performed at [facility], [address], [place], [place] [address]    Report Status 08/12/2024 FINAL  Final  Blood Culture (routine x 2)     Status: None (Preliminary result)   Collection Time:  08/11/24  5:45 AM   Specimen: BLOOD  Result Value Ref Range Status   Specimen Description BLOOD RIGHT FOREARM  Final   Special Requests   Final    BOTTLES DRAWN AEROBIC AND ANAEROBIC Blood Culture results may not be optimal due to an inadequate volume of blood received in culture bottles   Culture   Final    NO GROWTH 2 DAYS Performed at [facility], [address], [place], [place] [address]    Report Status PENDING  Incomplete  Blood Culture (routine x 2)     Status: None (Preliminary result)   Collection Time: 08/11/24  6:39 AM   Specimen: BLOOD  Result Value Ref Range Status   Specimen Description BLOOD LEFT ANTECUBITAL  Final   Special Requests   Final    BOTTLES DRAWN AEROBIC AND ANAEROBIC Blood Culture results may not be optimal due to an inadequate volume of blood received in culture bottles   Culture   Final    NO GROWTH 2 DAYS Performed at [facility], [address] [place], [place] [address]    Report Status PENDING  Incomplete     Radiology Studies: No results found.   Scheduled Meds:  [START ON 08/14/2024] cephALEXin  250 mg Oral Q12H   heparin  5,000 Units Subcutaneous Q8H   liver oil-zinc oxide   Topical BID   Continuous Infusions:     LOS: 2 days  MDM: Patient is high risk for one or more organ failure.  They necessitate ongoing hospitalization for continued IV therapies and subsequent lab monitoring. Total time spent interpreting labs and vitals, reviewing the medical record, coordinating care amongst consultants and care team members, directly assessing and discussing care with the patient and/or family: 55 min  Jarmon Javid, DO [facility]  To contact the attending physician between 7A-7P please use Epic Chat. To contact the covering physician during after hours 7P-7A, please review Amion.  08/13/2024, 2:25 PM   *This document has been created with the assistance of dictation software. Please excuse typographical errors.  *

## 2024-08-14 DIAGNOSIS — A419 Sepsis, unspecified organism: Secondary | ICD-10-CM | POA: Diagnosis not present

## 2024-08-14 DIAGNOSIS — Z7189 Other specified counseling: Secondary | ICD-10-CM | POA: Diagnosis not present

## 2024-08-14 DIAGNOSIS — L03114 Cellulitis of left upper limb: Secondary | ICD-10-CM

## 2024-08-14 DIAGNOSIS — R6520 Severe sepsis without septic shock: Secondary | ICD-10-CM

## 2024-08-14 DIAGNOSIS — Z515 Encounter for palliative care: Secondary | ICD-10-CM

## 2024-08-14 DIAGNOSIS — Z789 Other specified health status: Secondary | ICD-10-CM | POA: Diagnosis not present

## 2024-08-14 DIAGNOSIS — Z66 Do not resuscitate: Secondary | ICD-10-CM

## 2024-08-14 DIAGNOSIS — R627 Adult failure to thrive: Secondary | ICD-10-CM

## 2024-08-14 DIAGNOSIS — F03C0 Unspecified dementia, severe, without behavioral disturbance, psychotic disturbance, mood disturbance, and anxiety: Secondary | ICD-10-CM

## 2024-08-14 DIAGNOSIS — N390 Urinary tract infection, site not specified: Secondary | ICD-10-CM | POA: Diagnosis not present

## 2024-08-14 LAB — CBC WITH DIFFERENTIAL/PLATELET
Abs Immature Granulocytes: 0.03 K/uL (ref 0.00–0.07)
Basophils Absolute: 0 K/uL (ref 0.0–0.1)
Basophils Relative: 0 %
Eosinophils Absolute: 0.1 K/uL (ref 0.0–0.5)
Eosinophils Relative: 2 %
HCT: 37.2 % (ref 36.0–46.0)
Hemoglobin: 12 g/dL (ref 12.0–15.0)
Immature Granulocytes: 0 %
Lymphocytes Relative: 8 %
Lymphs Abs: 0.6 K/uL — ABNORMAL LOW (ref 0.7–4.0)
MCH: 30 pg (ref 26.0–34.0)
MCHC: 32.3 g/dL (ref 30.0–36.0)
MCV: 93 fL (ref 80.0–100.0)
Monocytes Absolute: 0.3 K/uL (ref 0.1–1.0)
Monocytes Relative: 5 %
Neutro Abs: 5.9 K/uL (ref 1.7–7.7)
Neutrophils Relative %: 85 %
Platelets: 117 K/uL — ABNORMAL LOW (ref 150–400)
RBC: 4 MIL/uL (ref 3.87–5.11)
RDW: 15.2 % (ref 11.5–15.5)
Smear Review: NORMAL
WBC: 6.9 K/uL (ref 4.0–10.5)
nRBC: 0 % (ref 0.0–0.2)

## 2024-08-14 LAB — COMPREHENSIVE METABOLIC PANEL WITH GFR
ALT: 26 U/L (ref 0–44)
AST: 36 U/L (ref 15–41)
Albumin: 2.2 g/dL — ABNORMAL LOW (ref 3.5–5.0)
Alkaline Phosphatase: 114 U/L (ref 38–126)
Anion gap: 9 (ref 5–15)
BUN: 45 mg/dL — ABNORMAL HIGH (ref 8–23)
CO2: 22 mmol/L (ref 22–32)
Calcium: 8.5 mg/dL — ABNORMAL LOW (ref 8.9–10.3)
Chloride: 114 mmol/L — ABNORMAL HIGH (ref 98–111)
Creatinine, Ser: 1.05 mg/dL — ABNORMAL HIGH (ref 0.44–1.00)
GFR, Estimated: 49 mL/min — ABNORMAL LOW (ref >60)
Glucose, Bld: 63 mg/dL — ABNORMAL LOW (ref 70–99)
Potassium: 3.7 mmol/L (ref 3.5–5.1)
Sodium: 145 mmol/L (ref 135–145)
Total Bilirubin: 0.6 mg/dL (ref 0.0–1.2)
Total Protein: 5.5 g/dL — ABNORMAL LOW (ref 6.5–8.1)

## 2024-08-14 LAB — MAGNESIUM: Magnesium: 2.2 mg/dL (ref 1.7–2.4)

## 2024-08-14 LAB — PHOSPHORUS: Phosphorus: 2.8 mg/dL (ref 2.5–4.6)

## 2024-08-14 MED ORDER — HALOPERIDOL LACTATE 5 MG/ML IJ SOLN: 2.5000 mg | INTRAMUSCULAR | Status: DC | PRN

## 2024-08-14 MED ORDER — DEXTROSE 50 % IV SOLN
INTRAVENOUS | Status: AC
  Administered 2024-08-14: 50 mL
  Filled 2024-08-14: qty 50

## 2024-08-14 MED ORDER — DEXTROSE IN LACTATED RINGERS 5 % IV SOLN: INTRAVENOUS | Status: DC

## 2024-08-14 MED ORDER — GLYCOPYRROLATE 0.2 MG/ML IJ SOLN: 0.2000 mg | INTRAMUSCULAR | Status: DC | PRN

## 2024-08-14 MED ORDER — DIPHENHYDRAMINE HCL 50 MG/ML IJ SOLN: 25.0000 mg | INTRAMUSCULAR | Status: DC | PRN

## 2024-08-14 MED ORDER — GLYCOPYRROLATE 1 MG PO TABS: 1.0000 mg | ORAL_TABLET | ORAL | Status: DC | PRN

## 2024-08-14 MED ORDER — MORPHINE SULFATE (PF) 2 MG/ML IV SOLN
2.0000 mg | INTRAVENOUS | Status: DC | PRN
  Administered 2024-08-14 (×2): 4 mg via INTRAVENOUS
  Filled 2024-08-14 (×2): qty 2

## 2024-08-14 MED ORDER — POLYVINYL ALCOHOL 1.4 % OP SOLN: 1.0000 [drp] | Freq: Four times a day (QID) | OPHTHALMIC | Status: DC | PRN

## 2024-08-14 MED ORDER — EPINEPHRINE 0.3 MG/0.3ML IJ SOAJ: 0.3000 mg | Freq: Once | INTRAMUSCULAR | Status: DC | PRN

## 2024-08-14 MED ORDER — SODIUM CHLORIDE 0.9 % IV SOLN: INTRAVENOUS | Status: DC

## 2024-08-14 NOTE — Discharge Summary (Signed)
 DISCHARGE SUMMARY    Rhonda Ingram Kahi [place] FMW:[id number] DOB: [birth year] DOA: 08/11/2024  PCP: Valerio Melanie DASEN, NP  Admit date: 08/11/2024 Discharge date: 08/14/2024   Recommendations for Outpatient Follow-up:  Discharging directly to inpatient hospice   Hospital Course: Rhonda Ingram is a 89 year old female with history of advanced dementia, hypertension, hyperlipidemia, osteoporosis, who is a nursing home resident brought in for an unwitnessed fall.  Upon arrival to the ED patient was hypothermic at 93.8, dehydrated with a sodium of 157, AKI with creatinine of 2.59, leukocytosis of 14.7, urinalysis with possible UTI.  She was also found to have multiple skin tears on her upper extremities and a stage II sacral ulcer.  She was started on broad-spectrum antibiotics and admitted for management.  Stay was further complicated by worsening delirium, and hypoglycemia.  I had multiple discussions with the patient's family members and granddaughter, POA, Etta and they ultimately decided to proceed with hospice care on 10/3.Rhonda Ingram  Patient is discharging today directly to inpatient hospice unit.  Advanced dementia BMI 13 Severe protein calorie malnutrition - End-stage dementia.  Complicated by severe protein calorie malnutrition, kidney injury provoked by dehydration, minimal p.o. intake, hypoglycemia, skin breakdown. - Discharging directly to inpatient hospice   Fall at nursing home - CT head negative --CXR negative for rib fractures - Fall precautions  Sepsis UTI Sacral decubitus ulcer Severe skin tear left upper extremity - Sepsis criteria: Hypothermia, leukocytosis.  Multiple sources including cellulitis and UTI. - Status post 4 days antibiotics   Hypokalemia, resolved Hypernatremia, resolved AKI, resolved - Appears secondary to dehydration, provoke by poor intake.  Resolved with IV fluids   Hypothyroidism - TSH 11.3, T4 normal   History of hypertension - BP low/normal without  medication  Discharge Instructions  Discharge Instructions     Call MD for:  difficulty breathing, headache or visual disturbances   Complete by: As directed    Call MD for:  persistant dizziness or light-headedness   Complete by: As directed    Call MD for:  persistant nausea and vomiting   Complete by: As directed    Call MD for:  severe uncontrolled pain   Complete by: As directed    Call MD for:  temperature >100.4   Complete by: As directed    Diet general   Complete by: As directed    Discharge wound care:   Complete by: As directed    Apply Xeroform gauze to bilat arm skin tears Q day, then cover with ABD pad and ace wrap to hold in place.  Moisten previous Xeroform with NS to assist with removal 2. Desitin to bilat buttocks/sacrum BID and PRN when turning or cleaning   Increase activity slowly   Complete by: As directed       Allergies as of 08/14/2024       Reactions   Alpha-d-galactosidase Itching, Shortness Of Breath   Meat Extract Shortness Of Breath   Other    Alpha-gal allergy (meat allergy or Mammalian Meat Allergy) beef/red meat/pork        Medication List     STOP taking these medications    benazepril  5 MG tablet Commonly known as: LOTENSIN    memantine 5 MG tablet Commonly known as: NAMENDA   mirtazapine  15 MG disintegrating tablet Commonly known as: REMERON  SOL-TAB               Discharge Care Instructions  (From admission, onward)           Start  Ordered   08/14/24 0000  Discharge wound care:       Comments: Apply Xeroform gauze to bilat arm skin tears Q day, then cover with ABD pad and ace wrap to hold in place.  Moisten previous Xeroform with NS to assist with removal 2. Desitin to bilat buttocks/sacrum BID and PRN when turning or cleaning   08/14/24 1516            Allergies  Allergen Reactions   Alpha-D-Galactosidase Itching and Shortness Of Breath   Meat Extract Shortness Of Breath   Other     Alpha-gal  allergy (meat allergy or Mammalian Meat Allergy) beef/red meat/pork    Consultations:    Procedures/Studies: CT Head Wo Contrast Result Date: 08/11/2024 CLINICAL DATA:  Provided history: Mental status change, unknown cause. Additional history provided: Unwitnessed fall. EXAM: CT HEAD WITHOUT CONTRAST TECHNIQUE: Contiguous axial images were obtained from the base of the skull through the vertex without intravenous contrast. RADIATION DOSE REDUCTION: This exam was performed according to the departmental dose-optimization program which includes automated exposure control, adjustment of the mA and/or kV according to patient size and/or use of iterative reconstruction technique. COMPARISON:  Head CT 10/15/2023. FINDINGS: Brain: Advanced generalized cerebral atrophy. Patchy and ill-defined hypoattenuation within the cerebral white matter, nonspecific but compatible with mild chronic small vessel ischemic disease. There is no acute intracranial hemorrhage. No demarcated cortical infarct. No extra-axial fluid collection. No evidence of an intracranial mass. No midline shift. Vascular: No hyperdense vessel.  Atherosclerotic calcifications. Skull: No calvarial fracture or aggressive osseous lesion. Sinuses/Orbits: No mass or acute finding within the imaged orbits. No significant paranasal sinus disease at the imaged levels. IMPRESSION: 1.  No evidence of an acute intracranial abnormality. 2. Mild chronic small vessel ischemic changes within the cerebral white matter. 3. Advanced generalized cerebral atrophy. Electronically Signed   By: Rockey Childs D.O.   On: 08/11/2024 09:28   DG Chest Portable 1 View Result Date: 08/11/2024 CLINICAL DATA:  89 year old female status post fall. Weakness and altered mental status. EXAM: PORTABLE CHEST 1 VIEW COMPARISON:  Chest radiographs 07/03/2018 and earlier. CT Abdomen and Pelvis 05/06/2023. FINDINGS: Portable AP semi upright view at 0556 hours. Advanced chronic Calcified  aortic atherosclerosis., tortuosity of the thoracic aorta is stable since 2019. Other mediastinal contours are within normal limits. Visualized tracheal air column is within normal limits. Large lung volumes. Allowing for portable technique the lungs are clear. No pneumothorax or pleural effusion. Stable cholecystectomy clips. Negative visible bowel gas pattern. No acute osseous abnormality identified. IMPRESSION: 1. No acute cardiopulmonary abnormality or acute traumatic injury identified. 2. Advanced Aortic Atherosclerosis (ICD10-I70.0). Electronically Signed   By: VEAR Hurst M.D.   On: 08/11/2024 06:08      Discharge Exam: Vitals:   08/14/24 0721 08/14/24 1202  BP: (!) 147/98   Pulse:  64  Resp: 18 18  Temp: (!) 97.5 F (36.4 C)   SpO2: 97% 98%   Vitals:   08/13/24 2229 08/14/24 0326 08/14/24 0721 08/14/24 1202  BP: (!) 143/95 (!) 115/93 (!) 147/98   Pulse: 93 90  64  Resp: 18 18 18 18   Temp:  (!) 97.5 F (36.4 C) (!) 97.5 F (36.4 C)   TempSrc:      SpO2: 97% 100% 97% 98%  Weight:        General exam: Appears calm and comfortable, NAD.  Thin, frail. Respiratory system: No work of breathing, symmetric chest wall expansion Cardiovascular system: S1 & S2 heard, RRR.  Gastrointestinal system: Abdomen is nondistended, soft and nontender.  Neuro: Sleeping easily today Skin: Thin skin, bruises in various stages of healing.  Arms are freshly bandaged, C/D/I   The results of significant diagnostics from this hospitalization (including imaging, microbiology, ancillary and laboratory) are listed below for reference.     Microbiology: Recent Results (from the past 240 hours)  Resp panel by RT-PCR (RSV, Flu A&B, Covid) Anterior Nasal Swab     Status: None   Collection Time: 08/11/24  5:04 AM   Specimen: Anterior Nasal Swab  Result Value Ref Range Status   SARS Coronavirus 2 by RT PCR NEGATIVE NEGATIVE Final    Comment: (NOTE) SARS-CoV-2 target nucleic acids are NOT DETECTED.  The  SARS-CoV-2 RNA is generally detectable in upper respiratory specimens during the acute phase of infection. The lowest concentration of SARS-CoV-2 viral copies this assay can detect is 138 copies/mL. A negative result does not preclude SARS-Cov-2 infection and should not be used as the sole basis for treatment or other patient management decisions. A negative result may occur with  improper specimen collection/handling, submission of specimen other than nasopharyngeal swab, presence of viral mutation(s) within the areas targeted by this assay, and inadequate number of viral copies(<138 copies/mL). A negative result must be combined with clinical observations, patient history, and epidemiological information. The expected result is Negative.  Fact Sheet for Patients:  BloggerCourse.com  Fact Sheet for Healthcare Providers:  SeriousBroker.it  This test is no t yet approved or cleared by the [place]  FDA and  has been authorized for detection and/or diagnosis of SARS-CoV-2 by FDA under an Emergency Use Authorization (EUA). This EUA will remain  in effect (meaning this test can be used) for the duration of the COVID-19 declaration under Section 564(b)(1) of the Act, 21 U.S.C.section 360bbb-3(b)(1), unless the authorization is terminated  or revoked sooner.       Influenza A by PCR NEGATIVE NEGATIVE Final   Influenza B by PCR NEGATIVE NEGATIVE Final    Comment: (NOTE) The Xpert Xpress SARS-CoV-2/FLU/RSV plus assay is intended as an aid in the diagnosis of influenza from Nasopharyngeal swab specimens and should not be used as a sole basis for treatment. Nasal washings and aspirates are unacceptable for Xpert Xpress SARS-CoV-2/FLU/RSV testing.  Fact Sheet for Patients: BloggerCourse.com  Fact Sheet for Healthcare Providers: SeriousBroker.it  This test is not yet approved or  cleared by the [place]  FDA and has been authorized for detection and/or diagnosis of SARS-CoV-2 by FDA under an Emergency Use Authorization (EUA). This EUA will remain in effect (meaning this test can be used) for the duration of the COVID-19 declaration under Section 564(b)(1) of the Act, 21 U.S.C. section 360bbb-3(b)(1), unless the authorization is terminated or revoked.     Resp Syncytial Virus by PCR NEGATIVE NEGATIVE Final    Comment: (NOTE) Fact Sheet for Patients: BloggerCourse.com  Fact Sheet for Healthcare Providers: SeriousBroker.it  This test is not yet approved or cleared by the [place]  FDA and has been authorized for detection and/or diagnosis of SARS-CoV-2 by FDA under an Emergency Use Authorization (EUA). This EUA will remain in effect (meaning this test can be used) for the duration of the COVID-19 declaration under Section 564(b)(1) of the Act, 21 U.S.C. section 360bbb-3(b)(1), unless the authorization is terminated or revoked.  Performed at [facility], [address] [place], [place] [address]   Urine Culture     Status: None   Collection Time: 08/11/24  5:05 AM  Specimen: Urine, Random  Result Value Ref Range Status   Specimen Description   Final    URINE, RANDOM Performed at [facility], [address] [place], [place] [address]    Special Requests   Final    NONE Reflexed from [id number] Performed at [facility], [address] [place], [place] [address]    Culture   Final    NO GROWTH Performed at [facility], [address] [place], [place] [address]    Report Status 08/12/2024 FINAL  Final  Blood Culture (routine x 2)     Status: None (Preliminary result)   Collection Time: 08/11/24  5:45 AM   Specimen: BLOOD  Result Value Ref Range Status   Specimen Description BLOOD RIGHT FOREARM  Final   Special Requests   Final    BOTTLES DRAWN AEROBIC  AND ANAEROBIC Blood Culture results may not be optimal due to an inadequate volume of blood received in culture bottles   Culture   Final    NO GROWTH 3 DAYS Performed at [facility], [address] [place], [place] [address]    Report Status PENDING  Incomplete  Blood Culture (routine x 2)     Status: None (Preliminary result)   Collection Time: 08/11/24  6:39 AM   Specimen: BLOOD  Result Value Ref Range Status   Specimen Description BLOOD LEFT ANTECUBITAL  Final   Special Requests   Final    BOTTLES DRAWN AEROBIC AND ANAEROBIC Blood Culture results may not be optimal due to an inadequate volume of blood received in culture bottles   Culture   Final    NO GROWTH 3 DAYS Performed at [facility], [address], [place], [place] [address]    Report Status PENDING  Incomplete     Labs: BNP (last 3 results) No results for input(s): BNP in the last 8760 hours. Basic Metabolic Panel: Recent Labs  Lab 08/11/24 0504 08/12/24 0356 08/13/24 0835 08/14/24 0327  NA 157* 144 145 145  K 3.5 2.9* 3.6 3.7  CL 121* 116* 114* 114*  CO2 21* 22 22 22   GLUCOSE 190* 151* 75 63*  BUN 93* 73* 48* 45*  CREATININE 2.59* 1.82* 1.37* 1.05*  CALCIUM 9.4 7.8* 8.3* 8.5*  MG  --   --   --  2.2  PHOS  --   --   --  2.8   Liver Function Tests: Recent Labs  Lab 08/11/24 0504 08/12/24 0356 08/13/24 0835 08/14/24 0327  AST 39 38 31 36  ALT 25 21 23 26   ALKPHOS 122 88 88 114  BILITOT 1.0 0.6 0.5 0.6  PROT 6.9 4.9* 4.8* 5.5*  ALBUMIN 2.9* 1.9* 1.8* 2.2*   No results for input(s): LIPASE, AMYLASE in the last 168 hours. No results for input(s): AMMONIA in the last 168 hours. CBC: Recent Labs  Lab 08/11/24 0504 08/12/24 0356 08/13/24 0835 08/14/24 0327  WBC 14.7* 9.3 8.6 6.9  NEUTROABS 13.7*  --  7.6 5.9  HGB 14.0 11.2* 11.7* 12.0  HCT 44.7 34.8* 36.7 37.2  MCV 95.5 94.3 92.9 93.0  PLT 139* 92* 111* 117*   Cardiac Enzymes: Recent Labs  Lab 08/11/24 1118   CKTOTAL 434*   BNP: Invalid input(s): POCBNP CBG: Recent Labs  Lab 08/11/24 0448  GLUCAP 181*   D-Dimer No results for input(s): DDIMER in the last 72 hours. Hgb A1c No results for input(s): HGBA1C in the last 72 hours. Lipid Profile No  results for input(s): CHOL, HDL, LDLCALC, TRIG, CHOLHDL, LDLDIRECT in the last 72 hours. Thyroid  function studies No results for input(s): TSH, T4TOTAL, T3FREE, THYROIDAB in the last 72 hours.  Invalid input(s): FREET3 Anemia work up No results for input(s): VITAMINB12, FOLATE, FERRITIN, TIBC, IRON , RETICCTPCT in the last 72 hours. Urinalysis    Component Value Date/Time   COLORURINE YELLOW (A) 08/11/2024 0505   APPEARANCEUR CLOUDY (A) 08/11/2024 0505   APPEARANCEUR Cloudy (A) 03/02/2024 1518   LABSPEC 1.015 08/11/2024 0505   PHURINE 5.0 08/11/2024 0505   GLUCOSEU NEGATIVE 08/11/2024 0505   HGBUR SMALL (A) 08/11/2024 0505   BILIRUBINUR NEGATIVE 08/11/2024 0505   BILIRUBINUR Negative 03/02/2024 1518   KETONESUR NEGATIVE 08/11/2024 0505   PROTEINUR NEGATIVE 08/11/2024 0505   NITRITE NEGATIVE 08/11/2024 0505   LEUKOCYTESUR LARGE (A) 08/11/2024 0505   Sepsis Labs Recent Labs  Lab 08/11/24 0504 08/12/24 0356 08/13/24 0835 08/14/24 0327  WBC 14.7* 9.3 8.6 6.9   Microbiology Recent Results (from the past 240 hours)  Resp panel by RT-PCR (RSV, Flu A&B, Covid) Anterior Nasal Swab     Status: None   Collection Time: 08/11/24  5:04 AM   Specimen: Anterior Nasal Swab  Result Value Ref Range Status   SARS Coronavirus 2 by RT PCR NEGATIVE NEGATIVE Final    Comment: (NOTE) SARS-CoV-2 target nucleic acids are NOT DETECTED.  The SARS-CoV-2 RNA is generally detectable in upper respiratory specimens during the acute phase of infection. The lowest concentration of SARS-CoV-2 viral copies this assay can detect is 138 copies/mL. A negative result does not preclude SARS-Cov-2 infection and should not be  used as the sole basis for treatment or other patient management decisions. A negative result may occur with  improper specimen collection/handling, submission of specimen other than nasopharyngeal swab, presence of viral mutation(s) within the areas targeted by this assay, and inadequate number of viral copies(<138 copies/mL). A negative result must be combined with clinical observations, patient history, and epidemiological information. The expected result is Negative.  Fact Sheet for Patients:  BloggerCourse.com  Fact Sheet for Healthcare Providers:  SeriousBroker.it  This test is no t yet approved or cleared by the [place]  FDA and  has been authorized for detection and/or diagnosis of SARS-CoV-2 by FDA under an Emergency Use Authorization (EUA). This EUA will remain  in effect (meaning this test can be used) for the duration of the COVID-19 declaration under Section 564(b)(1) of the Act, 21 U.S.C.section 360bbb-3(b)(1), unless the authorization is terminated  or revoked sooner.       Influenza A by PCR NEGATIVE NEGATIVE Final   Influenza B by PCR NEGATIVE NEGATIVE Final    Comment: (NOTE) The Xpert Xpress SARS-CoV-2/FLU/RSV plus assay is intended as an aid in the diagnosis of influenza from Nasopharyngeal swab specimens and should not be used as a sole basis for treatment. Nasal washings and aspirates are unacceptable for Xpert Xpress SARS-CoV-2/FLU/RSV testing.  Fact Sheet for Patients: BloggerCourse.com  Fact Sheet for Healthcare Providers: SeriousBroker.it  This test is not yet approved or cleared by the [place]  FDA and has been authorized for detection and/or diagnosis of SARS-CoV-2 by FDA under an Emergency Use Authorization (EUA). This EUA will remain in effect (meaning this test can be used) for the duration of the COVID-19 declaration under Section  564(b)(1) of the Act, 21 U.S.C. section 360bbb-3(b)(1), unless the authorization is terminated or revoked.     Resp Syncytial Virus by PCR NEGATIVE NEGATIVE Final    Comment: (NOTE)  Fact Sheet for Patients: BloggerCourse.com  Fact Sheet for Healthcare Providers: SeriousBroker.it  This test is not yet approved or cleared by the [place]  FDA and has been authorized for detection and/or diagnosis of SARS-CoV-2 by FDA under an Emergency Use Authorization (EUA). This EUA will remain in effect (meaning this test can be used) for the duration of the COVID-19 declaration under Section 564(b)(1) of the Act, 21 U.S.C. section 360bbb-3(b)(1), unless the authorization is terminated or revoked.  Performed at [facility], [address] [place], [place] [address]   Urine Culture     Status: None   Collection Time: 08/11/24  5:05 AM   Specimen: Urine, Random  Result Value Ref Range Status   Specimen Description   Final    URINE, RANDOM Performed at [facility], [address], [place], [place] [address]    Special Requests   Final    NONE Reflexed from [id number] Performed at [facility], [address], [place], [place] [address]    Culture   Final    NO GROWTH Performed at [facility], [address], [place], [place] [address]    Report Status 08/12/2024 FINAL  Final  Blood Culture (routine x 2)     Status: None (Preliminary result)   Collection Time: 08/11/24  5:45 AM   Specimen: BLOOD  Result Value Ref Range Status   Specimen Description BLOOD RIGHT FOREARM  Final   Special Requests   Final    BOTTLES DRAWN AEROBIC AND ANAEROBIC Blood Culture results may not be optimal due to an inadequate volume of blood received in culture bottles   Culture   Final    NO GROWTH 3 DAYS Performed at [facility], [address], [place], [place] [address]    Report Status PENDING  Incomplete   Blood Culture (routine x 2)     Status: None (Preliminary result)   Collection Time: 08/11/24  6:39 AM   Specimen: BLOOD  Result Value Ref Range Status   Specimen Description BLOOD LEFT ANTECUBITAL  Final   Special Requests   Final    BOTTLES DRAWN AEROBIC AND ANAEROBIC Blood Culture results may not be optimal due to an inadequate volume of blood received in culture bottles   Culture   Final    NO GROWTH 3 DAYS Performed at [facility], [address], [place], [place] [address]    Report Status PENDING  Incomplete     Time coordinating discharge: 32 min   SIGNED: Tensley Wery, DO [facility] 08/14/2024, 3:16 PM Pager   If 7PM-7AM, please contact night-coverage

## 2024-08-14 NOTE — Plan of Care (Signed)

## 2024-08-14 NOTE — Plan of Care (Signed)

## 2024-08-14 NOTE — Progress Notes (Signed)
 [facility] Liaison Note-   Update- Patient has been approved for hospice InPatient Unit [facility]).   Notified [name], Granddaughter, who accepts the bed offer.  Notification to [name], [facility] and hospital  medical care team.   Plan to discharge to the [facility] after Granddaughter finishes consents.  [facility] RN to call report to the Hospice Home at  [phone]  Please medicate the patient prior to EMS transport as needed for comfort during transport.  Please send signed and completed DNR with patient at discharge.  Thank you for allowing participation in this patient's care.  [name] Na, RN Nurse Liaison [phone]

## 2024-08-14 NOTE — Progress Notes (Signed)
 [facility]) HOSPITAL LIAISON NOTE   Received request from Rhonda Ingram, Transitions of Care [facility]), for assessment for InPatient Hospice Unit [facility]). Spoke with patient's granddaughter, Rhonda Ingram, to initiate education related to hospice philosophy, services, team approach to care and hospice IPU criteria. Rhonda verbalized understanding of information given. Per discussion, the plan is for discharge to the IPU if accepted.   [facility] information and contact numbers given to  Rhonda Ingram.  Above information shared with Rhonda Ingram and hospital medical care team.   Please call with any hospice related questions or concerns. Thank you for the opportunity to participate in this patient's care.   Rhonda Ingram Na, MA, BSN, RN, FNE Nurse Liaison [phone]

## 2024-08-14 NOTE — TOC Progression Note (Signed)
 Transition of Care [facility]) - Progression Note    Patient Details  Name: Rhonda Ingram MRN: [id number] Date of Birth: [birth year]  Transition of Care [facility]) CM/SW Contact  Marinda Cooks, RN Phone Number: 08/14/2024, 10:54 AM  Clinical Narrative:     This CM alerted by medical team pt's family interested in hospice services for pt. This CM spoke with pt's granddaughter Rhonda Ingram) introduced role  an provided choice for Hospice agency.Confirmed with  pt's granddaughter she in agreement with Hospice and using [facility]. Pt's grand daughter informed her mother pt's daughter is currently under going treatment for brian CA so she is speaking on behalf of pt. TOC will cont to follow dc planning / care coordination and update as applicable.                     Expected Discharge Plan and Services                                               Social Drivers of Health (SDOH) Interventions SDOH Screenings   Food Insecurity: No Food Insecurity (08/13/2024)  Housing: Low Risk  (08/13/2024)  Transportation Needs: No Transportation Needs (08/13/2024)  Utilities: Not At Risk (08/13/2024)  Alcohol Screen: Low Risk  (04/10/2023)  Depression (PHQ2-9): High Risk (03/02/2024)  Financial Resource Strain: Low Risk  (04/10/2023)  Physical Activity: Insufficiently Active (04/10/2023)  Social Connections: Socially Isolated (08/13/2024)  Stress: No Stress Concern Present (04/10/2023)  Tobacco Use: Low Risk  (08/11/2024)    Readmission Risk Interventions     No data to display

## 2024-08-14 NOTE — Consult Note (Addendum)
 Consultation Note Date: 08/14/2024 at 1150  Patient Name: Rhonda Ingram  DOB: 11/25/1925  MRN: [id number]  Age / Sex: 89 y.o., female  PCP: Valerio Melanie DASEN, NP Referring Physician: Leesa Kast, DO  HPI/Patient Profile: 89 y.o. female  with past medical history significant for V[place] dementia, HTN, HLD and osteoporosis. Patient presented to ED from [facility] 08/11/2024 c/o unwitnessed fall.  Patient found to have numerous skin tears on upper extremities concerning for infection and stage III sacral ulcer.  Patient unable to provide history.   ED labs significant for Na+ 157, chloride 121, CO2 21, glucose 190, BUN 93, creatinine 2.59, albumin 2.9, GFR 16. WBC 14.7, platelets 139, TSH 11.393 UA positive for small Hgb, large leukocytes and many bacteria.  ED vitals 100/68, HR 88, RR 16, SpO2 95% RA.  Patient was found to be hypothermic with a rectal temp 93.8 F.  CXR mistreated no acute cardiopulmonary abnormality or acute traumatic injury. CT head demonstrates no evidence of acute intracranial abnormality, mild chronic small vessel ischemic changes within cerebral white matter with advanced generalized cerebral atrophy.  Due to ED findings, treatment was initiated for code sepsis.  TRH was consulted for admission and management of fall, sepsis due to UTI/skin tears/sacral decubitus ulcer, hypernatremia and AKI.  Palliative care was consulted to assist with goals of care conversations.  Clinical Assessment and Goals of Care: Extensive chart review completed prior to meeting patient including labs, vital signs, imaging, progress notes, orders, and available advanced directive documents from current and previous encounters. I then met with patient and granddaughter, Etta, to discuss diagnosis prognosis, GOC, EOL wishes, disposition and options.     Latest Ref Rng & Units 08/14/2024    3:27  AM 08/13/2024    8:35 AM 08/12/2024    3:56 AM  CBC  WBC 4.0 - 10.5 K/uL 6.9  8.6  9.3   Hemoglobin 12.0 - 15.0 g/dL 87.9  88.2  88.7   Hematocrit 36.0 - 46.0 % 37.2  36.7  34.8   Platelets 150 - 400 K/uL 117  111  92       Latest Ref Rng & Units 08/14/2024    3:27 AM 08/13/2024    8:35 AM 08/12/2024    3:56 AM  CMP  Glucose 70 - 99 mg/dL 63  75  848   BUN 8 - 23 mg/dL 45  48  73   Creatinine 0.44 - 1.00 mg/dL 8.94  8.62  8.17   Sodium 135 - 145 mmol/L 145  145  144   Potassium 3.5 - 5.1 mmol/L 3.7  3.6  2.9   Chloride 98 - 111 mmol/L 114  114  116   CO2 22 - 32 mmol/L 22  22  22    Calcium 8.9 - 10.3 mg/dL 8.5  8.3  7.8   Total Protein 6.5 - 8.1 g/dL 5.5  4.8  4.9   Total Bilirubin 0.0 - 1.2 mg/dL 0.6  0.5  0.6   Alkaline Phos 38 - 126 U/L  114  88  88   AST 15 - 41 U/L 36  31  38   ALT 0 - 44 U/L 26  23  21     Ill-appearing, elderly female lying in bed with granddaughter at bedside.  She does not respond to verbal or tactile stimuli.  Respirations are even and unlabored.  She is in no distress.  I introduced Palliative Medicine as specialized medical care for people living with serious illness. It focuses on providing relief from the symptoms and stress of a serious illness. The goal is to improve quality of life for both the patient and the family.  We discussed a brief life review of the patient.  Etta shares that her grandmother is widowed.  [name] and her husband had 2 children with multiple grand and great grandchildren.  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  As far as functional and nutritional status Etta shares after caring for her grandmother for 3 years in her home, it became necessary to place her in a facility due to unsafe wandering.  She is not sure how patient has been eating at the facility but states patient's p.o. intake has been minimal during this admission.  [name] requires assistance with all ADLs and has not  walked for approximately 1 month.  Etta shares that she is patient's H POA since her mother, patient's daughter, is currently receiving treatment for metastatic cancer.  We discussed patient's current illness and what it means in the larger context of patient's on-going co-morbidities.  Natural disease trajectory and expectations at EOL were discussed.  Etta shares she understands that her grandmother is nearing end-of-life.  She describes patient seeing and speaking to persons who have been deceased and reaching for things in the air that are not there.  I attempted to elicit values and goals of care important to the patient.  Most important thing to [place] at this time is ensuring her grandmother is kept comfortable.  She verbalizes understanding that comfort care, initiated by Dr. Leesa, is end-of-life care.  The difference between aggressive medical intervention and comfort care was considered in light of the patient's goals of care.   Advance directives, concepts specific to code status, artificial feeding and hydration, and rehospitalization were considered and discussed.  Etta confirms her grandmother is a DNR.  She would never want a feeding tube or artificial supporting measures to include IV fluids.  Education offered regarding concept specific to human mortality and the limitations of medical interventions to prolong life when the body begins to fail to thrive.  Family is facing treatment option decisions, advanced directive, and anticipatory care needs.     Discussed with patient/family the importance of continued conversation with family and the medical providers regarding overall plan of care and treatment options, ensuring decisions are within the context of the patient's values and GOCs.    Hospice and Palliative Care services outpatient were explained and offered. Etta expresses interest in having her grandmother evaluated for transfer to hospice home.  Discussed  with Saddie, [facility] liaison, granddaughters wish for patient to transfer to hospice home.  Questions and concerns were addressed. The family was encouraged to call with questions or concerns.   Primary Decision Maker HCPOA-Christie Fisher  Physical Exam Vitals reviewed.  Constitutional:      General: She is not in acute distress.    Appearance: She is ill-appearing.     Comments: Cachectic  HENT:     Head:  Normocephalic.     Mouth/Throat:     Mouth: Mucous membranes are dry.  Pulmonary:     Effort: Pulmonary effort is normal. No respiratory distress.  Musculoskeletal:     Comments: LUE with significant swelling at/near elbow  Skin:    General: Skin is warm and dry.     Comments: Ace wrap to left upper extremity for extensive skin tears   Recommendations/Plan:         Focus of care is comfort and dignity allowing for natural death Utilize ordered medications to maintain comfort Patient accepted to [facility] hospice plan to transfer today  Palliative Assessment/Data:10%   Discussed plan of care with [facility] liaison.  Thank you for this consult. Palliative medicine will continue to follow and assist holistically.   Time Total: 90 minutes  Time spent includes: Detailed review of medical records (labs, imaging, vital signs), medically appropriate exam (mental status, respiratory, cardiac, skin), discussed with treatment team, counseling and educating patient, family and staff, documenting clinical information, medication management and coordination of care.     Devere Sacks, AMANDA [facility] Palliative Medicine Team  08/14/2024 9:46 AM  Office [phone]  Pager [phone]     Please contact Palliative Medicine Team providers via AMION for questions and concerns.

## 2024-08-16 LAB — CULTURE, BLOOD (ROUTINE X 2)
Culture: NO GROWTH
Culture: NO GROWTH

## 2024-08-18 DIAGNOSIS — F0390 Unspecified dementia without behavioral disturbance: Secondary | ICD-10-CM | POA: Diagnosis not present

## 2024-08-18 DIAGNOSIS — F39 Unspecified mood [affective] disorder: Secondary | ICD-10-CM | POA: Diagnosis not present

## 2024-09-12 DEATH — deceased

## 2024-10-06 ENCOUNTER — Telehealth: Payer: Self-pay

## 2024-10-06 NOTE — Progress Notes (Signed)
   10/06/2024  Patient ID: Rhonda Ingram, female   DOB: 1926-11-07, 89 y.o.   MRN: [id number]  This patient is appearing on a report for being at risk of failing the adherence measure for hypertension (ACEi/ARB) medications this calendar year.   Medication: benazepril  5mg   Last fill date: 07/14/24 for 7 day supply  Medication was stopped at 10/3 hospital discharge.   Rhonda Ingram, PharmD, DPLA
